# Patient Record
Sex: Female | Born: 1945 | Race: White | Hispanic: No | Marital: Married | Smoking: Former smoker
Health system: Southern US, Community
[De-identification: ages and names within clinical notes are randomized; demographics above are authoritative.]

## PROBLEM LIST (undated history)

## (undated) DIAGNOSIS — E785 Hyperlipidemia, unspecified: Secondary | ICD-10-CM

## (undated) DIAGNOSIS — C4491 Basal cell carcinoma of skin, unspecified: Secondary | ICD-10-CM

## (undated) DIAGNOSIS — Z803 Family history of malignant neoplasm of breast: Secondary | ICD-10-CM

## (undated) DIAGNOSIS — M329 Systemic lupus erythematosus, unspecified: Secondary | ICD-10-CM

## (undated) DIAGNOSIS — M1990 Unspecified osteoarthritis, unspecified site: Secondary | ICD-10-CM

## (undated) DIAGNOSIS — IMO0002 Reserved for concepts with insufficient information to code with codable children: Secondary | ICD-10-CM

## (undated) DIAGNOSIS — F329 Major depressive disorder, single episode, unspecified: Secondary | ICD-10-CM

## (undated) DIAGNOSIS — Z808 Family history of malignant neoplasm of other organs or systems: Secondary | ICD-10-CM

## (undated) DIAGNOSIS — M351 Other overlap syndromes: Secondary | ICD-10-CM

## (undated) DIAGNOSIS — C169 Malignant neoplasm of stomach, unspecified: Secondary | ICD-10-CM

## (undated) DIAGNOSIS — K219 Gastro-esophageal reflux disease without esophagitis: Secondary | ICD-10-CM

## (undated) DIAGNOSIS — Z8489 Family history of other specified conditions: Secondary | ICD-10-CM

## (undated) DIAGNOSIS — R011 Cardiac murmur, unspecified: Secondary | ICD-10-CM

## (undated) DIAGNOSIS — M797 Fibromyalgia: Secondary | ICD-10-CM

## (undated) DIAGNOSIS — F32A Depression, unspecified: Secondary | ICD-10-CM

## (undated) DIAGNOSIS — Z800 Family history of malignant neoplasm of digestive organs: Secondary | ICD-10-CM

## (undated) DIAGNOSIS — Z801 Family history of malignant neoplasm of trachea, bronchus and lung: Secondary | ICD-10-CM

## (undated) HISTORY — PX: BREAST SURGERY: SHX581

## (undated) HISTORY — PX: ABDOMINAL HYSTERECTOMY: SHX81

## (undated) HISTORY — PX: APPENDECTOMY: SHX54

## (undated) HISTORY — DX: Family history of malignant neoplasm of digestive organs: Z80.0

## (undated) HISTORY — DX: Family history of malignant neoplasm of trachea, bronchus and lung: Z80.1

## (undated) HISTORY — PX: TONSILLECTOMY: SUR1361

## (undated) HISTORY — DX: Family history of malignant neoplasm of breast: Z80.3

## (undated) HISTORY — DX: Family history of malignant neoplasm of other organs or systems: Z80.8

## (undated) HISTORY — DX: Malignant neoplasm of stomach, unspecified: C16.9

---

## 1991-07-18 HISTORY — PX: BREAST EXCISIONAL BIOPSY: SUR124

## 1998-05-24 ENCOUNTER — Ambulatory Visit (HOSPITAL_COMMUNITY): Admission: RE | Admit: 1998-05-24 | Discharge: 1998-05-24 | Payer: Self-pay | Admitting: Obstetrics and Gynecology

## 1998-08-15 ENCOUNTER — Other Ambulatory Visit: Admission: RE | Admit: 1998-08-15 | Discharge: 1998-08-15 | Payer: Self-pay | Admitting: Obstetrics & Gynecology

## 1998-11-28 ENCOUNTER — Ambulatory Visit (HOSPITAL_COMMUNITY): Admission: RE | Admit: 1998-11-28 | Discharge: 1998-11-28 | Payer: Self-pay | Admitting: Gastroenterology

## 1999-06-06 ENCOUNTER — Ambulatory Visit (HOSPITAL_COMMUNITY): Admission: RE | Admit: 1999-06-06 | Discharge: 1999-06-06 | Payer: Self-pay | Admitting: Obstetrics and Gynecology

## 2000-06-25 ENCOUNTER — Other Ambulatory Visit: Admission: RE | Admit: 2000-06-25 | Discharge: 2000-06-25 | Payer: Self-pay | Admitting: Obstetrics and Gynecology

## 2000-09-09 ENCOUNTER — Encounter: Payer: Self-pay | Admitting: Obstetrics and Gynecology

## 2000-09-09 ENCOUNTER — Ambulatory Visit (HOSPITAL_COMMUNITY): Admission: RE | Admit: 2000-09-09 | Discharge: 2000-09-09 | Payer: Self-pay | Admitting: Obstetrics and Gynecology

## 2001-07-23 ENCOUNTER — Encounter: Admission: RE | Admit: 2001-07-23 | Discharge: 2001-07-23 | Payer: Self-pay | Admitting: Obstetrics and Gynecology

## 2001-07-23 ENCOUNTER — Encounter: Payer: Self-pay | Admitting: Obstetrics and Gynecology

## 2001-07-30 ENCOUNTER — Other Ambulatory Visit: Admission: RE | Admit: 2001-07-30 | Discharge: 2001-07-30 | Payer: Self-pay | Admitting: Obstetrics and Gynecology

## 2003-07-13 ENCOUNTER — Encounter: Admission: RE | Admit: 2003-07-13 | Discharge: 2003-07-13 | Payer: Self-pay | Admitting: Obstetrics and Gynecology

## 2003-07-13 ENCOUNTER — Encounter: Payer: Self-pay | Admitting: Obstetrics and Gynecology

## 2004-09-22 ENCOUNTER — Encounter: Admission: RE | Admit: 2004-09-22 | Discharge: 2004-09-22 | Payer: Self-pay | Admitting: Family Medicine

## 2005-01-25 ENCOUNTER — Encounter: Admission: RE | Admit: 2005-01-25 | Discharge: 2005-01-25 | Payer: Self-pay | Admitting: Obstetrics and Gynecology

## 2006-05-21 ENCOUNTER — Encounter: Admission: RE | Admit: 2006-05-21 | Discharge: 2006-05-21 | Payer: Self-pay | Admitting: Obstetrics and Gynecology

## 2006-06-24 ENCOUNTER — Encounter: Admission: RE | Admit: 2006-06-24 | Discharge: 2006-06-24 | Payer: Self-pay | Admitting: Obstetrics and Gynecology

## 2007-07-02 ENCOUNTER — Encounter: Admission: RE | Admit: 2007-07-02 | Discharge: 2007-07-02 | Payer: Self-pay | Admitting: Obstetrics and Gynecology

## 2008-07-26 ENCOUNTER — Encounter: Admission: RE | Admit: 2008-07-26 | Discharge: 2008-07-26 | Payer: Self-pay | Admitting: Obstetrics and Gynecology

## 2009-07-25 ENCOUNTER — Encounter: Admission: RE | Admit: 2009-07-25 | Discharge: 2009-07-25 | Payer: Self-pay | Admitting: Family Medicine

## 2009-08-08 ENCOUNTER — Encounter: Admission: RE | Admit: 2009-08-08 | Discharge: 2009-08-08 | Payer: Self-pay | Admitting: Obstetrics and Gynecology

## 2010-10-06 ENCOUNTER — Encounter: Admission: RE | Admit: 2010-10-06 | Discharge: 2010-10-06 | Payer: Self-pay | Admitting: Family Medicine

## 2011-11-13 ENCOUNTER — Other Ambulatory Visit: Payer: Self-pay | Admitting: Family Medicine

## 2011-11-13 DIAGNOSIS — Z1231 Encounter for screening mammogram for malignant neoplasm of breast: Secondary | ICD-10-CM

## 2011-11-19 ENCOUNTER — Ambulatory Visit
Admission: RE | Admit: 2011-11-19 | Discharge: 2011-11-19 | Disposition: A | Discharge status: Home / Self Care | Payer: PRIVATE HEALTH INSURANCE | Source: Ambulatory Visit | Attending: Family Medicine | Admitting: Family Medicine

## 2011-11-19 DIAGNOSIS — Z1231 Encounter for screening mammogram for malignant neoplasm of breast: Secondary | ICD-10-CM

## 2012-12-23 ENCOUNTER — Other Ambulatory Visit: Payer: Self-pay | Admitting: Family Medicine

## 2012-12-23 DIAGNOSIS — Z1231 Encounter for screening mammogram for malignant neoplasm of breast: Secondary | ICD-10-CM

## 2013-01-19 ENCOUNTER — Ambulatory Visit
Admission: RE | Admit: 2013-01-19 | Discharge: 2013-01-19 | Disposition: A | Discharge status: Home / Self Care | Payer: PRIVATE HEALTH INSURANCE | Source: Ambulatory Visit | Attending: Family Medicine | Admitting: Family Medicine

## 2013-01-19 DIAGNOSIS — Z1231 Encounter for screening mammogram for malignant neoplasm of breast: Secondary | ICD-10-CM

## 2013-10-29 DIAGNOSIS — M899 Disorder of bone, unspecified: Secondary | ICD-10-CM | POA: Diagnosis not present

## 2013-10-29 DIAGNOSIS — E559 Vitamin D deficiency, unspecified: Secondary | ICD-10-CM | POA: Diagnosis not present

## 2013-10-29 DIAGNOSIS — F339 Major depressive disorder, recurrent, unspecified: Secondary | ICD-10-CM | POA: Diagnosis not present

## 2013-10-29 DIAGNOSIS — E782 Mixed hyperlipidemia: Secondary | ICD-10-CM | POA: Diagnosis not present

## 2013-10-29 DIAGNOSIS — M2550 Pain in unspecified joint: Secondary | ICD-10-CM | POA: Diagnosis not present

## 2013-10-29 DIAGNOSIS — K219 Gastro-esophageal reflux disease without esophagitis: Secondary | ICD-10-CM | POA: Diagnosis not present

## 2013-10-29 DIAGNOSIS — Z0000 Encounter for general adult medical examination without abnormal findings: Secondary | ICD-10-CM | POA: Diagnosis not present

## 2013-10-29 DIAGNOSIS — Z23 Encounter for immunization: Secondary | ICD-10-CM | POA: Diagnosis not present

## 2013-10-29 DIAGNOSIS — R7301 Impaired fasting glucose: Secondary | ICD-10-CM | POA: Diagnosis not present

## 2013-11-27 DIAGNOSIS — M1710 Unilateral primary osteoarthritis, unspecified knee: Secondary | ICD-10-CM | POA: Diagnosis not present

## 2013-12-29 DIAGNOSIS — M19049 Primary osteoarthritis, unspecified hand: Secondary | ICD-10-CM | POA: Diagnosis not present

## 2013-12-30 DIAGNOSIS — M2040 Other hammer toe(s) (acquired), unspecified foot: Secondary | ICD-10-CM | POA: Diagnosis not present

## 2013-12-30 DIAGNOSIS — M19079 Primary osteoarthritis, unspecified ankle and foot: Secondary | ICD-10-CM | POA: Diagnosis not present

## 2014-01-18 DIAGNOSIS — M1710 Unilateral primary osteoarthritis, unspecified knee: Secondary | ICD-10-CM | POA: Diagnosis not present

## 2014-02-15 DIAGNOSIS — M545 Low back pain, unspecified: Secondary | ICD-10-CM | POA: Diagnosis not present

## 2014-02-15 DIAGNOSIS — M19049 Primary osteoarthritis, unspecified hand: Secondary | ICD-10-CM | POA: Diagnosis not present

## 2014-02-15 DIAGNOSIS — M2550 Pain in unspecified joint: Secondary | ICD-10-CM | POA: Diagnosis not present

## 2014-02-15 DIAGNOSIS — IMO0001 Reserved for inherently not codable concepts without codable children: Secondary | ICD-10-CM | POA: Diagnosis not present

## 2014-02-15 DIAGNOSIS — M159 Polyosteoarthritis, unspecified: Secondary | ICD-10-CM | POA: Diagnosis not present

## 2014-02-23 ENCOUNTER — Encounter (HOSPITAL_COMMUNITY): Payer: Self-pay | Admitting: Emergency Medicine

## 2014-02-23 ENCOUNTER — Emergency Department (HOSPITAL_COMMUNITY): Payer: Medicare Other

## 2014-02-23 ENCOUNTER — Encounter: Payer: Self-pay | Admitting: Internal Medicine

## 2014-02-23 ENCOUNTER — Emergency Department (HOSPITAL_COMMUNITY)
Admission: EM | Admit: 2014-02-23 | Discharge: 2014-02-23 | Disposition: A | Discharge status: Home / Self Care | Payer: Medicare Other | Attending: Emergency Medicine | Admitting: Emergency Medicine

## 2014-02-23 DIAGNOSIS — J40 Bronchitis, not specified as acute or chronic: Secondary | ICD-10-CM | POA: Diagnosis not present

## 2014-02-23 DIAGNOSIS — R110 Nausea: Secondary | ICD-10-CM | POA: Insufficient documentation

## 2014-02-23 DIAGNOSIS — R0789 Other chest pain: Secondary | ICD-10-CM | POA: Diagnosis not present

## 2014-02-23 DIAGNOSIS — Z87891 Personal history of nicotine dependence: Secondary | ICD-10-CM | POA: Diagnosis not present

## 2014-02-23 DIAGNOSIS — E785 Hyperlipidemia, unspecified: Secondary | ICD-10-CM | POA: Diagnosis not present

## 2014-02-23 DIAGNOSIS — Z8739 Personal history of other diseases of the musculoskeletal system and connective tissue: Secondary | ICD-10-CM | POA: Insufficient documentation

## 2014-02-23 DIAGNOSIS — R0602 Shortness of breath: Secondary | ICD-10-CM | POA: Diagnosis not present

## 2014-02-23 DIAGNOSIS — R079 Chest pain, unspecified: Secondary | ICD-10-CM | POA: Diagnosis not present

## 2014-02-23 DIAGNOSIS — R61 Generalized hyperhidrosis: Secondary | ICD-10-CM | POA: Diagnosis not present

## 2014-02-23 HISTORY — DX: Unspecified osteoarthritis, unspecified site: M19.90

## 2014-02-23 HISTORY — DX: Hyperlipidemia, unspecified: E78.5

## 2014-02-23 HISTORY — DX: Reserved for concepts with insufficient information to code with codable children: IMO0002

## 2014-02-23 HISTORY — DX: Systemic lupus erythematosus, unspecified: M32.9

## 2014-02-23 HISTORY — DX: Fibromyalgia: M79.7

## 2014-02-23 LAB — CBC WITH DIFFERENTIAL/PLATELET
Basophils Absolute: 0 10*3/uL (ref 0.0–0.1)
Basophils Relative: 0 % (ref 0–1)
Eosinophils Absolute: 0.1 10*3/uL (ref 0.0–0.7)
Eosinophils Relative: 1 % (ref 0–5)
HCT: 41 % (ref 36.0–46.0)
Hemoglobin: 14.2 g/dL (ref 12.0–15.0)
Lymphocytes Relative: 18 % (ref 12–46)
Lymphs Abs: 2.3 10*3/uL (ref 0.7–4.0)
MCH: 30.3 pg (ref 26.0–34.0)
MCHC: 34.6 g/dL (ref 30.0–36.0)
MCV: 87.4 fL (ref 78.0–100.0)
Monocytes Absolute: 1.2 10*3/uL — ABNORMAL HIGH (ref 0.1–1.0)
Monocytes Relative: 10 % (ref 3–12)
Neutro Abs: 8.9 10*3/uL — ABNORMAL HIGH (ref 1.7–7.7)
Neutrophils Relative %: 71 % (ref 43–77)
Platelets: 225 10*3/uL (ref 150–400)
RBC: 4.69 MIL/uL (ref 3.87–5.11)
RDW: 13 % (ref 11.5–15.5)
WBC: 12.5 10*3/uL — ABNORMAL HIGH (ref 4.0–10.5)

## 2014-02-23 LAB — TROPONIN I
Troponin I: <0.3 ng/mL (ref <0.30)
Troponin I: <0.3 ng/mL (ref <0.30)

## 2014-02-23 LAB — BASIC METABOLIC PANEL
BUN: 22 mg/dL (ref 6–23)
CO2: 25 mEq/L (ref 19–32)
Calcium: 9.4 mg/dL (ref 8.4–10.5)
Chloride: 97 mEq/L (ref 96–112)
Creatinine, Ser: 0.59 mg/dL (ref 0.50–1.10)
GFR calc Af Amer: >90 mL/min (ref >90)
GFR calc non Af Amer: >90 mL/min (ref >90)
Glucose, Bld: 99 mg/dL (ref 70–99)
Potassium: 3.9 mEq/L (ref 3.7–5.3)
Sodium: 136 mEq/L — ABNORMAL LOW (ref 137–147)

## 2014-02-23 NOTE — ED Provider Notes (Signed)
CSN: [id number]     Arrival date & time 02/23/14  1734 History   First MD Initiated Contact with Patient 02/23/14 1744     Chief Complaint  Patient presents with  . Chest Pain    (Consider location/radiation/quality/duration/timing/severity/associated sxs/prior Treatment) HPI Comments: Patient is a 68 year old female with a history of hyperlipidemia who presents to the emergency department for chest pain. Patient states that chest pain was sharp and stabbing in nature and began approximately 1.5 hours ago while at her husband's physician's office. Patient states that pain was present in her lower central chest and radiated to her right jaw and subsequently her left jaw. Patient states that pain improved slightly when lying down and loosening her clothes. Pain lasted approximately 45 minutes before spontaneously resolving. She endorses associated shortness of breath, stating that taking a deep breath but her chest pain worse. She also states she experienced some diaphoresis and nausea. She endorses a history of the same at rest approximately 3 months ago, but states that chest pain was not nearly as severe and did not last as long. Patient denies associated fever, hemoptysis, syncope or near syncope, vomiting, extremity numbness/tingling, extremity weakness, and leg swelling. Patient states she is currently taking a course of prednisone after testing ANA +; she has a longstanding hx of fibromyalgia for which she sees a rheumatologist. Patient denies a history of ACS, CAD, diabetes mellitus, PE/DVT, and coagulopathies. Patient endorses a family history of ACS and her grandmother at the age of 73. She is a former smoker.  Patient is a 68 y.o. female presenting with chest pain. The history is provided by the patient. No language interpreter was used.  Chest Pain Associated symptoms: diaphoresis, nausea and shortness of breath   Associated symptoms: no numbness, not vomiting and no weakness      Past  Medical History  Diagnosis Date  . Lupus   . Osteoarthritis     knee, hand, and feet  . Fibromyalgia   . Hyperlipidemia    Past Surgical History  Procedure Laterality Date  . Abdominal hysterectomy     No family history on file. History  Substance Use Topics  . Smoking status: Former Smoker    Types: Cigarettes    Quit date: 04/18/1991  . Smokeless tobacco: Not on file  . Alcohol Use: Yes     Comment: moderate    OB History   Grav Para Term Preterm Abortions TAB SAB Ect Mult Living                  Review of Systems  Constitutional: Positive for diaphoresis.  HENT:       +b/l jaw pain  Respiratory: Positive for shortness of breath.   Cardiovascular: Positive for chest pain.  Gastrointestinal: Positive for nausea. Negative for vomiting.  Neurological: Negative for syncope, weakness and numbness.  All other systems reviewed and are negative.     Allergies  Review of patient's allergies indicates no known allergies.  Home Medications  No current outpatient prescriptions on file. BP 136/63  Pulse 65  Temp(Src) 98.8 F (37.1 C) (Oral)  Resp 18  SpO2 96%  Physical Exam  Nursing note and vitals reviewed. Constitutional: She is oriented to person, place, and time. She appears well-developed and well-nourished. No distress.  HENT:  Head: Normocephalic and atraumatic.  Mouth/Throat: Oropharynx is clear and moist. No oropharyngeal exudate.  Eyes: Conjunctivae and EOM are normal. Pupils are equal, round, and reactive to light. No scleral icterus.  Neck:  Normal range of motion.  Cardiovascular: Normal rate, regular rhythm and normal heart sounds.   Pulmonary/Chest: Effort normal and breath sounds normal. No respiratory distress. She has no wheezes. She has no rales. She exhibits no tenderness.  Retractions or accessory muscle use. Chest expansion symmetric.  Abdominal: Soft. She exhibits no distension. There is no tenderness. There is no rebound and no guarding.   Musculoskeletal: Normal range of motion.  Neurological: She is alert and oriented to person, place, and time.  Skin: Skin is warm and dry. No rash noted. She is not diaphoretic. No erythema. No pallor.  Psychiatric: She has a normal mood and affect. Her behavior is normal.    ED Course  Procedures (including critical care time) Labs Review Labs Reviewed  CBC WITH DIFFERENTIAL - Abnormal; Notable for the following:    WBC 12.5 (*)    Neutro Abs 8.9 (*)    Monocytes Absolute 1.2 (*)    All other components within normal limits  BASIC METABOLIC PANEL - Abnormal; Notable for the following:    Sodium 136 (*)    All other components within normal limits  TROPONIN I   Imaging Review Dg Chest 2 View  02/23/2014   CLINICAL DATA Mid sternal chest pain radiating posteriorly and laterally, former smoker, history lupus  EXAM CHEST  2 VIEW  COMPARISON 10/28/2012  FINDINGS Normal heart size, mediastinal contours, and pulmonary vascularity.  Central peribronchial thickening.  No acute infiltrate, pleural effusion or pneumothorax.  Bones demineralized.  IMPRESSION Mild bronchitic changes.  SIGNATURE  Electronically Signed   By: Lavonia Dana M.D.   On: 02/23/2014 19:26     EKG Interpretation   Date/Time:  Tuesday February 23 2014 17:56:38 EDT Ventricular Rate:  61 PR Interval:  184 QRS Duration: 95 QT Interval:  390 QTC Calculation: 393 R Axis:   -40 Text Interpretation:  Sinus rhythm Left axis deviation RSR' in V1 or V2,  probably normal variant No old tracing to compare Confirmed by [facility]   MD, Juanda Crumble [phone]) on 02/23/2014 6:18:09 PM      MDM   Final diagnoses:  Chest pain    Patient presents to the ED today for chest pain lasting 30-45 minutes before spontaneously resolving. She has a hx of HLD and is a former smoker. Patient has hx of similar CP ~3 months ago which resolved spontaneously, but was shorter in duration and less intense. Patient well and nontoxic appearing on exam today;  no significant findings. Will work up with CBC, BMP, troponin and CXR. EKG stable without STEMI or ischemic change.  Cardiac work up today is negative. Patient has had no recurrence of pain over ED course. Patient's heart score is 4 c/w moderate risk for MACE; however, patient does not desire admission to the hospital today. Given her improvement in her symptoms spontaneously without intervention, negative serial troponins, normal EKG and normal CXR, I believe the patient is stable for d/c today with instruction to f/u with her PCP for further evaluation of her symptoms and scheduling of an outpatient stress test. Return precautions discussed with the patient and provided at discharge. Patient agreeable to plan today with no unaddressed concerns. Patient seen also by Dr. Karle Starch who is in agreement with this work up, assessment, management plan, and patient's stability for d/c.   Filed Vitals:   02/23/14 1736 02/23/14 1849 02/23/14 2133 02/23/14 2309  BP: 152/77 136/63 126/51 119/68  Pulse: 66 65    Temp: 98.8 F (37.1 C)  TempSrc: Oral     Resp: 16 18 12 11   SpO2: 94% 96% 95% 98%       Antonietta Breach, PA-C 02/24/14 0009

## 2014-02-23 NOTE — Progress Notes (Signed)
Patient ID: Jane Howe, female   DOB: [birth year], 68 y.o.   MRN: [id number]    Location:    [facility]  Place of Service:  Office    Allergies not on file    HPI:  Patient in office with her husband. Developed substernal chest pain and diaphoresis. Radiation of pain into jaw. No prior cardiac history. Mild dyspnea.  Hx fibromyalgia.  Medications: Patient's Medications   No medications on file     Review of Systems  Constitutional: Positive for diaphoresis. Negative for fever.  HENT: Negative.   Eyes: Negative.   Respiratory: Positive for shortness of breath. Negative for cough and choking.   Cardiovascular: Positive for chest pain and palpitations. Negative for leg swelling.  Endocrine: Negative.   Genitourinary: Negative.   Musculoskeletal:       Fibromyalgia  Skin:       diaphoretic  Allergic/Immunologic: Negative.   Neurological: Negative.   Hematological: Negative.   Psychiatric/Behavioral: Negative.     There were no vitals filed for this visit. Physical Exam  Constitutional: She is oriented to person, place, and time. She appears well-developed and well-nourished. She appears distressed.  HENT:  Head: Normocephalic and atraumatic.  Right Ear: External ear normal.  Left Ear: External ear normal.  Nose: Nose normal.  Mouth/Throat: Oropharynx is clear and moist.  Eyes: EOM are normal.  Cardiovascular: Regular rhythm, normal heart sounds and intact distal pulses.  Exam reveals no gallop and no friction rub.   No murmur heard. Pulmonary/Chest: No respiratory distress. She has no wheezes. She has no rales. She exhibits no tenderness.  Abdominal: She exhibits no distension and no mass. There is no tenderness. There is no rebound.  Musculoskeletal: Normal range of motion. She exhibits no edema and no tenderness.  Neurological: She is alert and oriented to person, place, and time. She has normal reflexes. No cranial nerve deficit. Coordination  normal.  Skin: No rash noted. She is diaphoretic. No erythema. No pallor.  Psychiatric: She has a normal mood and affect. Her behavior is normal. Judgment and thought content normal.      Assessment/Plan  Chest pain   Received ASA  Sent to Er for further evaluation

## 2014-02-23 NOTE — ED Notes (Addendum)
Pt arrived by [facility]. Pt went to the doctor with husband who had an appointment and while she was there started having sharp stabbing CP that went through to her back, across her breast, and to her right jaw and then bilateral jaw. Pt became pale and diaphoretic and had some SOB with some nausea. Pt has hx of osteoarthritis and was just dx with Lupus. Doctors office administered ASA 324mg . Pt make decreased without any other tx to 5/10.

## 2014-02-23 NOTE — Discharge Instructions (Signed)
Chest Pain (Nonspecific) °It is often hard to give a specific diagnosis for the cause of chest pain. There is always a chance that your pain could be related to something serious, such as a heart attack or a blood clot in the lungs. You need to follow up with your caregiver for further evaluation. °CAUSES  °· Heartburn. °· Pneumonia or bronchitis. °· Anxiety or stress. °· Inflammation around your heart (pericarditis) or lung (pleuritis or pleurisy). °· A blood clot in the lung. °· A collapsed lung (pneumothorax). It can develop suddenly on its own (spontaneous pneumothorax) or from injury (trauma) to the chest. °· Shingles infection (herpes zoster virus). °The chest wall is composed of bones, muscles, and cartilage. Any of these can be the source of the pain. °· The bones can be bruised by injury. °· The muscles or cartilage can be strained by coughing or overwork. °· The cartilage can be affected by inflammation and become sore (costochondritis). °DIAGNOSIS  °Lab tests or other studies, such as X-rays, electrocardiography, stress testing, or cardiac imaging, may be needed to find the cause of your pain.  °TREATMENT  °· Treatment depends on what may be causing your chest pain. Treatment may include: °· Acid blockers for heartburn. °· Anti-inflammatory medicine. °· Pain medicine for inflammatory conditions. °· Antibiotics if an infection is present. °· You may be advised to change lifestyle habits. This includes stopping smoking and avoiding alcohol, caffeine, and chocolate. °· You may be advised to keep your head raised (elevated) when sleeping. This reduces the chance of acid going backward from your stomach into your esophagus. °· Most of the time, nonspecific chest pain will improve within 2 to 3 days with rest and mild pain medicine. °HOME CARE INSTRUCTIONS  °· If antibiotics were prescribed, take your antibiotics as directed. Finish them even if you start to feel better. °· For the next few days, avoid physical  activities that bring on chest pain. Continue physical activities as directed. °· Do not smoke. °· Avoid drinking alcohol. °· Only take over-the-counter or prescription medicine for pain, discomfort, or fever as directed by your caregiver. °· Follow your caregiver's suggestions for further testing if your chest pain does not go away. °· Keep any follow-up appointments you made. If you do not go to an appointment, you could develop lasting (chronic) problems with pain. If there is any problem keeping an appointment, you must call to reschedule. °SEEK MEDICAL CARE IF:  °· You think you are having problems from the medicine you are taking. Read your medicine instructions carefully. °· Your chest pain does not go away, even after treatment. °· You develop a rash with blisters on your chest. °SEEK IMMEDIATE MEDICAL CARE IF:  °· You have increased chest pain or pain that spreads to your arm, neck, jaw, back, or abdomen. °· You develop shortness of breath, an increasing cough, or you are coughing up blood. °· You have severe back or abdominal pain, feel nauseous, or vomit. °· You develop severe weakness, fainting, or chills. °· You have a fever. °THIS IS AN EMERGENCY. Do not wait to see if the pain will go away. Get medical help at once. Call your local emergency services (911 in [place] Do not drive yourself to the hospital. °MAKE SURE YOU:  °· Understand these instructions. °· Will watch your condition. °· Will get help right away if you are not doing well or get worse. °Document Released: 09/12/2005 Document Revised: 02/25/2012 Document Reviewed: 07/08/2008 °[employer]® Patient Information ©2014 [employer]   [employer]. ° °

## 2014-02-24 NOTE — ED Provider Notes (Signed)
Medical screening examination/treatment/procedure(s) were performed by non-physician practitioner and as supervising physician I was immediately available for consultation/collaboration.   EKG Interpretation   Date/Time:  Tuesday February 23 2014 17:56:38 EDT Ventricular Rate:  61 PR Interval:  184 QRS Duration: 95 QT Interval:  390 QTC Calculation: 393 R Axis:   -40 Text Interpretation:  Sinus rhythm Left axis deviation RSR' in V1 or V2,  probably normal variant No old tracing to compare Confirmed by [facility]   MD, Griffon Herberg [phone]) on 02/23/2014 6:18:09 PM        Mercie Eon. Karle Starch, MD 02/24/14 1019

## 2014-02-26 DIAGNOSIS — R0789 Other chest pain: Secondary | ICD-10-CM | POA: Diagnosis not present

## 2014-03-09 DIAGNOSIS — E669 Obesity, unspecified: Secondary | ICD-10-CM | POA: Diagnosis not present

## 2014-03-09 DIAGNOSIS — R0789 Other chest pain: Secondary | ICD-10-CM | POA: Diagnosis not present

## 2014-03-09 DIAGNOSIS — E785 Hyperlipidemia, unspecified: Secondary | ICD-10-CM | POA: Diagnosis not present

## 2014-03-09 DIAGNOSIS — K219 Gastro-esophageal reflux disease without esophagitis: Secondary | ICD-10-CM | POA: Diagnosis not present

## 2014-03-10 ENCOUNTER — Encounter: Payer: Self-pay | Admitting: Internal Medicine

## 2014-03-10 DIAGNOSIS — M949 Disorder of cartilage, unspecified: Secondary | ICD-10-CM | POA: Diagnosis not present

## 2014-03-10 DIAGNOSIS — M899 Disorder of bone, unspecified: Secondary | ICD-10-CM | POA: Diagnosis not present

## 2014-03-29 DIAGNOSIS — M2040 Other hammer toe(s) (acquired), unspecified foot: Secondary | ICD-10-CM | POA: Diagnosis not present

## 2014-03-29 DIAGNOSIS — M129 Arthropathy, unspecified: Secondary | ICD-10-CM | POA: Diagnosis not present

## 2014-05-20 ENCOUNTER — Other Ambulatory Visit: Payer: Self-pay

## 2014-05-20 DIAGNOSIS — Z1231 Encounter for screening mammogram for malignant neoplasm of breast: Secondary | ICD-10-CM

## 2014-06-01 DIAGNOSIS — J209 Acute bronchitis, unspecified: Secondary | ICD-10-CM | POA: Diagnosis not present

## 2014-06-01 DIAGNOSIS — K5900 Constipation, unspecified: Secondary | ICD-10-CM | POA: Diagnosis not present

## 2014-06-01 DIAGNOSIS — K648 Other hemorrhoids: Secondary | ICD-10-CM | POA: Diagnosis not present

## 2014-06-03 ENCOUNTER — Ambulatory Visit
Admission: RE | Admit: 2014-06-03 | Discharge: 2014-06-03 | Disposition: A | Discharge status: Home / Self Care | Payer: Medicare Other | Source: Ambulatory Visit

## 2014-06-03 DIAGNOSIS — Z1231 Encounter for screening mammogram for malignant neoplasm of breast: Secondary | ICD-10-CM | POA: Diagnosis not present

## 2014-06-23 DIAGNOSIS — H2589 Other age-related cataract: Secondary | ICD-10-CM | POA: Diagnosis not present

## 2014-07-06 DIAGNOSIS — M1710 Unilateral primary osteoarthritis, unspecified knee: Secondary | ICD-10-CM | POA: Diagnosis not present

## 2014-07-16 DIAGNOSIS — M129 Arthropathy, unspecified: Secondary | ICD-10-CM | POA: Diagnosis not present

## 2014-08-18 DIAGNOSIS — IMO0001 Reserved for inherently not codable concepts without codable children: Secondary | ICD-10-CM | POA: Diagnosis not present

## 2014-08-18 DIAGNOSIS — M2550 Pain in unspecified joint: Secondary | ICD-10-CM | POA: Diagnosis not present

## 2014-08-18 DIAGNOSIS — M545 Low back pain, unspecified: Secondary | ICD-10-CM | POA: Diagnosis not present

## 2014-08-18 DIAGNOSIS — M159 Polyosteoarthritis, unspecified: Secondary | ICD-10-CM | POA: Diagnosis not present

## 2014-08-26 DIAGNOSIS — M329 Systemic lupus erythematosus, unspecified: Secondary | ICD-10-CM | POA: Diagnosis not present

## 2014-11-04 DIAGNOSIS — E559 Vitamin D deficiency, unspecified: Secondary | ICD-10-CM | POA: Diagnosis not present

## 2014-11-04 DIAGNOSIS — M797 Fibromyalgia: Secondary | ICD-10-CM | POA: Diagnosis not present

## 2014-11-04 DIAGNOSIS — E782 Mixed hyperlipidemia: Secondary | ICD-10-CM | POA: Diagnosis not present

## 2014-11-04 DIAGNOSIS — Z79899 Other long term (current) drug therapy: Secondary | ICD-10-CM | POA: Diagnosis not present

## 2014-11-04 DIAGNOSIS — G250 Essential tremor: Secondary | ICD-10-CM | POA: Diagnosis not present

## 2014-11-04 DIAGNOSIS — R7301 Impaired fasting glucose: Secondary | ICD-10-CM | POA: Diagnosis not present

## 2014-11-04 DIAGNOSIS — M2550 Pain in unspecified joint: Secondary | ICD-10-CM | POA: Diagnosis not present

## 2014-11-04 DIAGNOSIS — F329 Major depressive disorder, single episode, unspecified: Secondary | ICD-10-CM | POA: Diagnosis not present

## 2015-02-14 DIAGNOSIS — J329 Chronic sinusitis, unspecified: Secondary | ICD-10-CM | POA: Diagnosis not present

## 2015-02-14 DIAGNOSIS — R05 Cough: Secondary | ICD-10-CM | POA: Diagnosis not present

## 2015-02-14 DIAGNOSIS — H109 Unspecified conjunctivitis: Secondary | ICD-10-CM | POA: Diagnosis not present

## 2015-02-16 DIAGNOSIS — M797 Fibromyalgia: Secondary | ICD-10-CM | POA: Diagnosis not present

## 2015-02-16 DIAGNOSIS — M2550 Pain in unspecified joint: Secondary | ICD-10-CM | POA: Diagnosis not present

## 2015-02-16 DIAGNOSIS — M545 Low back pain: Secondary | ICD-10-CM | POA: Diagnosis not present

## 2015-02-16 DIAGNOSIS — M150 Primary generalized (osteo)arthritis: Secondary | ICD-10-CM | POA: Diagnosis not present

## 2015-02-23 DIAGNOSIS — K219 Gastro-esophageal reflux disease without esophagitis: Secondary | ICD-10-CM | POA: Diagnosis not present

## 2015-02-23 DIAGNOSIS — H109 Unspecified conjunctivitis: Secondary | ICD-10-CM | POA: Diagnosis not present

## 2015-02-23 DIAGNOSIS — R251 Tremor, unspecified: Secondary | ICD-10-CM | POA: Diagnosis not present

## 2015-02-23 DIAGNOSIS — Z0001 Encounter for general adult medical examination with abnormal findings: Secondary | ICD-10-CM | POA: Diagnosis not present

## 2015-02-23 DIAGNOSIS — Z23 Encounter for immunization: Secondary | ICD-10-CM | POA: Diagnosis not present

## 2015-02-23 DIAGNOSIS — M899 Disorder of bone, unspecified: Secondary | ICD-10-CM | POA: Diagnosis not present

## 2015-02-23 DIAGNOSIS — Z8371 Family history of colonic polyps: Secondary | ICD-10-CM | POA: Diagnosis not present

## 2015-02-23 DIAGNOSIS — M797 Fibromyalgia: Secondary | ICD-10-CM | POA: Diagnosis not present

## 2015-02-23 DIAGNOSIS — R350 Frequency of micturition: Secondary | ICD-10-CM | POA: Diagnosis not present

## 2015-02-23 DIAGNOSIS — F3342 Major depressive disorder, recurrent, in full remission: Secondary | ICD-10-CM | POA: Diagnosis not present

## 2015-02-23 DIAGNOSIS — E782 Mixed hyperlipidemia: Secondary | ICD-10-CM | POA: Diagnosis not present

## 2015-02-23 DIAGNOSIS — N952 Postmenopausal atrophic vaginitis: Secondary | ICD-10-CM | POA: Diagnosis not present

## 2015-05-11 DIAGNOSIS — M1711 Unilateral primary osteoarthritis, right knee: Secondary | ICD-10-CM | POA: Diagnosis not present

## 2015-05-19 DIAGNOSIS — M19071 Primary osteoarthritis, right ankle and foot: Secondary | ICD-10-CM | POA: Diagnosis not present

## 2015-05-19 DIAGNOSIS — M19072 Primary osteoarthritis, left ankle and foot: Secondary | ICD-10-CM | POA: Diagnosis not present

## 2015-06-13 ENCOUNTER — Other Ambulatory Visit: Payer: Self-pay

## 2015-06-13 DIAGNOSIS — Z1231 Encounter for screening mammogram for malignant neoplasm of breast: Secondary | ICD-10-CM

## 2015-06-15 DIAGNOSIS — M1711 Unilateral primary osteoarthritis, right knee: Secondary | ICD-10-CM | POA: Diagnosis not present

## 2015-06-24 DIAGNOSIS — M1711 Unilateral primary osteoarthritis, right knee: Secondary | ICD-10-CM | POA: Diagnosis not present

## 2015-07-01 DIAGNOSIS — H5213 Myopia, bilateral: Secondary | ICD-10-CM | POA: Diagnosis not present

## 2015-07-01 DIAGNOSIS — H52223 Regular astigmatism, bilateral: Secondary | ICD-10-CM | POA: Diagnosis not present

## 2015-07-01 DIAGNOSIS — H524 Presbyopia: Secondary | ICD-10-CM | POA: Diagnosis not present

## 2015-07-01 DIAGNOSIS — H25813 Combined forms of age-related cataract, bilateral: Secondary | ICD-10-CM | POA: Diagnosis not present

## 2015-07-01 DIAGNOSIS — H43819 Vitreous degeneration, unspecified eye: Secondary | ICD-10-CM | POA: Diagnosis not present

## 2015-07-18 ENCOUNTER — Ambulatory Visit
Admission: RE | Admit: 2015-07-18 | Discharge: 2015-07-18 | Disposition: A | Discharge status: Home / Self Care | Payer: Medicare Other | Source: Ambulatory Visit

## 2015-07-18 DIAGNOSIS — Z1231 Encounter for screening mammogram for malignant neoplasm of breast: Secondary | ICD-10-CM | POA: Diagnosis not present

## 2015-08-16 DIAGNOSIS — D2371 Other benign neoplasm of skin of right lower limb, including hip: Secondary | ICD-10-CM | POA: Diagnosis not present

## 2015-08-16 DIAGNOSIS — Z85828 Personal history of other malignant neoplasm of skin: Secondary | ICD-10-CM | POA: Diagnosis not present

## 2015-08-16 DIAGNOSIS — L814 Other melanin hyperpigmentation: Secondary | ICD-10-CM | POA: Diagnosis not present

## 2015-08-16 DIAGNOSIS — L738 Other specified follicular disorders: Secondary | ICD-10-CM | POA: Diagnosis not present

## 2015-08-16 DIAGNOSIS — L570 Actinic keratosis: Secondary | ICD-10-CM | POA: Diagnosis not present

## 2015-08-16 DIAGNOSIS — L918 Other hypertrophic disorders of the skin: Secondary | ICD-10-CM | POA: Diagnosis not present

## 2015-08-16 DIAGNOSIS — L280 Lichen simplex chronicus: Secondary | ICD-10-CM | POA: Diagnosis not present

## 2015-08-16 DIAGNOSIS — L821 Other seborrheic keratosis: Secondary | ICD-10-CM | POA: Diagnosis not present

## 2015-08-16 DIAGNOSIS — D2272 Melanocytic nevi of left lower limb, including hip: Secondary | ICD-10-CM | POA: Diagnosis not present

## 2015-08-17 DIAGNOSIS — M359 Systemic involvement of connective tissue, unspecified: Secondary | ICD-10-CM | POA: Diagnosis not present

## 2015-08-17 DIAGNOSIS — M150 Primary generalized (osteo)arthritis: Secondary | ICD-10-CM | POA: Diagnosis not present

## 2015-08-17 DIAGNOSIS — M549 Dorsalgia, unspecified: Secondary | ICD-10-CM | POA: Diagnosis not present

## 2015-08-17 DIAGNOSIS — M797 Fibromyalgia: Secondary | ICD-10-CM | POA: Diagnosis not present

## 2015-09-26 DIAGNOSIS — M797 Fibromyalgia: Secondary | ICD-10-CM | POA: Diagnosis not present

## 2015-09-26 DIAGNOSIS — R350 Frequency of micturition: Secondary | ICD-10-CM | POA: Diagnosis not present

## 2015-09-26 DIAGNOSIS — Z8371 Family history of colonic polyps: Secondary | ICD-10-CM | POA: Diagnosis not present

## 2015-09-26 DIAGNOSIS — Z791 Long term (current) use of non-steroidal anti-inflammatories (NSAID): Secondary | ICD-10-CM | POA: Diagnosis not present

## 2015-09-26 DIAGNOSIS — N952 Postmenopausal atrophic vaginitis: Secondary | ICD-10-CM | POA: Diagnosis not present

## 2015-09-26 DIAGNOSIS — R251 Tremor, unspecified: Secondary | ICD-10-CM | POA: Diagnosis not present

## 2015-09-26 DIAGNOSIS — H109 Unspecified conjunctivitis: Secondary | ICD-10-CM | POA: Diagnosis not present

## 2015-09-26 DIAGNOSIS — Z0001 Encounter for general adult medical examination with abnormal findings: Secondary | ICD-10-CM | POA: Diagnosis not present

## 2015-09-26 DIAGNOSIS — K219 Gastro-esophageal reflux disease without esophagitis: Secondary | ICD-10-CM | POA: Diagnosis not present

## 2015-09-26 DIAGNOSIS — F3342 Major depressive disorder, recurrent, in full remission: Secondary | ICD-10-CM | POA: Diagnosis not present

## 2015-09-26 DIAGNOSIS — E782 Mixed hyperlipidemia: Secondary | ICD-10-CM | POA: Diagnosis not present

## 2015-09-26 DIAGNOSIS — M899 Disorder of bone, unspecified: Secondary | ICD-10-CM | POA: Diagnosis not present

## 2015-10-09 DIAGNOSIS — H00013 Hordeolum externum right eye, unspecified eyelid: Secondary | ICD-10-CM | POA: Diagnosis not present

## 2015-12-07 DIAGNOSIS — M797 Fibromyalgia: Secondary | ICD-10-CM | POA: Diagnosis not present

## 2015-12-07 DIAGNOSIS — M359 Systemic involvement of connective tissue, unspecified: Secondary | ICD-10-CM | POA: Diagnosis not present

## 2015-12-07 DIAGNOSIS — M549 Dorsalgia, unspecified: Secondary | ICD-10-CM | POA: Diagnosis not present

## 2015-12-07 DIAGNOSIS — M150 Primary generalized (osteo)arthritis: Secondary | ICD-10-CM | POA: Diagnosis not present

## 2016-02-09 ENCOUNTER — Other Ambulatory Visit: Payer: Self-pay | Admitting: Family Medicine

## 2016-02-09 DIAGNOSIS — R1011 Right upper quadrant pain: Secondary | ICD-10-CM | POA: Diagnosis not present

## 2016-02-09 DIAGNOSIS — R1013 Epigastric pain: Secondary | ICD-10-CM | POA: Diagnosis not present

## 2016-02-09 DIAGNOSIS — K5909 Other constipation: Secondary | ICD-10-CM | POA: Diagnosis not present

## 2016-02-20 ENCOUNTER — Ambulatory Visit
Admission: RE | Admit: 2016-02-20 | Discharge: 2016-02-20 | Disposition: A | Discharge status: Home / Self Care | Payer: Medicare Other | Source: Ambulatory Visit | Attending: Family Medicine | Admitting: Family Medicine

## 2016-02-20 DIAGNOSIS — R110 Nausea: Secondary | ICD-10-CM | POA: Diagnosis not present

## 2016-02-20 DIAGNOSIS — R1013 Epigastric pain: Secondary | ICD-10-CM

## 2016-02-28 DIAGNOSIS — Z0000 Encounter for general adult medical examination without abnormal findings: Secondary | ICD-10-CM | POA: Diagnosis not present

## 2016-02-28 DIAGNOSIS — R110 Nausea: Secondary | ICD-10-CM | POA: Diagnosis not present

## 2016-02-28 DIAGNOSIS — E782 Mixed hyperlipidemia: Secondary | ICD-10-CM | POA: Diagnosis not present

## 2016-02-28 DIAGNOSIS — M351 Other overlap syndromes: Secondary | ICD-10-CM | POA: Diagnosis not present

## 2016-02-28 DIAGNOSIS — Z124 Encounter for screening for malignant neoplasm of cervix: Secondary | ICD-10-CM | POA: Diagnosis not present

## 2016-02-28 DIAGNOSIS — M8589 Other specified disorders of bone density and structure, multiple sites: Secondary | ICD-10-CM | POA: Diagnosis not present

## 2016-02-28 DIAGNOSIS — F3342 Major depressive disorder, recurrent, in full remission: Secondary | ICD-10-CM | POA: Diagnosis not present

## 2016-02-28 DIAGNOSIS — E559 Vitamin D deficiency, unspecified: Secondary | ICD-10-CM | POA: Diagnosis not present

## 2016-02-28 DIAGNOSIS — M2550 Pain in unspecified joint: Secondary | ICD-10-CM | POA: Diagnosis not present

## 2016-02-28 DIAGNOSIS — R7301 Impaired fasting glucose: Secondary | ICD-10-CM | POA: Diagnosis not present

## 2016-02-28 DIAGNOSIS — K219 Gastro-esophageal reflux disease without esophagitis: Secondary | ICD-10-CM | POA: Diagnosis not present

## 2016-03-06 DIAGNOSIS — M25571 Pain in right ankle and joints of right foot: Secondary | ICD-10-CM | POA: Diagnosis not present

## 2016-03-06 DIAGNOSIS — M797 Fibromyalgia: Secondary | ICD-10-CM | POA: Diagnosis not present

## 2016-03-06 DIAGNOSIS — M359 Systemic involvement of connective tissue, unspecified: Secondary | ICD-10-CM | POA: Diagnosis not present

## 2016-03-06 DIAGNOSIS — M150 Primary generalized (osteo)arthritis: Secondary | ICD-10-CM | POA: Diagnosis not present

## 2016-03-06 DIAGNOSIS — M549 Dorsalgia, unspecified: Secondary | ICD-10-CM | POA: Diagnosis not present

## 2016-04-05 DIAGNOSIS — M8589 Other specified disorders of bone density and structure, multiple sites: Secondary | ICD-10-CM | POA: Diagnosis not present

## 2016-04-05 DIAGNOSIS — M1711 Unilateral primary osteoarthritis, right knee: Secondary | ICD-10-CM | POA: Diagnosis not present

## 2016-04-05 DIAGNOSIS — M859 Disorder of bone density and structure, unspecified: Secondary | ICD-10-CM | POA: Diagnosis not present

## 2016-04-16 DIAGNOSIS — M2041 Other hammer toe(s) (acquired), right foot: Secondary | ICD-10-CM | POA: Diagnosis not present

## 2016-04-16 DIAGNOSIS — M19071 Primary osteoarthritis, right ankle and foot: Secondary | ICD-10-CM | POA: Diagnosis not present

## 2016-04-16 DIAGNOSIS — M2042 Other hammer toe(s) (acquired), left foot: Secondary | ICD-10-CM | POA: Diagnosis not present

## 2016-05-03 DIAGNOSIS — M19072 Primary osteoarthritis, left ankle and foot: Secondary | ICD-10-CM | POA: Diagnosis not present

## 2016-05-03 DIAGNOSIS — M19071 Primary osteoarthritis, right ankle and foot: Secondary | ICD-10-CM | POA: Diagnosis not present

## 2016-05-10 DIAGNOSIS — H524 Presbyopia: Secondary | ICD-10-CM | POA: Diagnosis not present

## 2016-05-10 DIAGNOSIS — H5213 Myopia, bilateral: Secondary | ICD-10-CM | POA: Diagnosis not present

## 2016-05-10 DIAGNOSIS — H25813 Combined forms of age-related cataract, bilateral: Secondary | ICD-10-CM | POA: Diagnosis not present

## 2016-05-10 DIAGNOSIS — H52223 Regular astigmatism, bilateral: Secondary | ICD-10-CM | POA: Diagnosis not present

## 2016-05-27 ENCOUNTER — Ambulatory Visit: Payer: Self-pay | Admitting: Orthopedic Surgery

## 2016-06-06 DIAGNOSIS — M359 Systemic involvement of connective tissue, unspecified: Secondary | ICD-10-CM | POA: Diagnosis not present

## 2016-06-06 DIAGNOSIS — M797 Fibromyalgia: Secondary | ICD-10-CM | POA: Diagnosis not present

## 2016-06-06 DIAGNOSIS — M549 Dorsalgia, unspecified: Secondary | ICD-10-CM | POA: Diagnosis not present

## 2016-06-06 DIAGNOSIS — M150 Primary generalized (osteo)arthritis: Secondary | ICD-10-CM | POA: Diagnosis not present

## 2016-07-09 ENCOUNTER — Encounter (HOSPITAL_COMMUNITY): Payer: Self-pay

## 2016-07-09 ENCOUNTER — Encounter (HOSPITAL_COMMUNITY)
Admission: RE | Admit: 2016-07-09 | Discharge: 2016-07-09 | Disposition: A | Discharge status: Home / Self Care | Payer: Medicare Other | Source: Ambulatory Visit | Attending: Orthopedic Surgery | Admitting: Orthopedic Surgery

## 2016-07-09 DIAGNOSIS — Z01818 Encounter for other preprocedural examination: Secondary | ICD-10-CM | POA: Insufficient documentation

## 2016-07-09 HISTORY — DX: Gastro-esophageal reflux disease without esophagitis: K21.9

## 2016-07-09 HISTORY — DX: Depression, unspecified: F32.A

## 2016-07-09 HISTORY — DX: Major depressive disorder, single episode, unspecified: F32.9

## 2016-07-09 HISTORY — DX: Basal cell carcinoma of skin, unspecified: C44.91

## 2016-07-09 HISTORY — DX: Other overlap syndromes: M35.1

## 2016-07-09 LAB — COMPREHENSIVE METABOLIC PANEL
ALT: 17 U/L (ref 14–54)
AST: 20 U/L (ref 15–41)
Albumin: 4.2 g/dL (ref 3.5–5.0)
Alkaline Phosphatase: 50 U/L (ref 38–126)
Anion gap: 5 (ref 5–15)
BUN: 13 mg/dL (ref 6–20)
CO2: 28 mmol/L (ref 22–32)
Calcium: 8.7 mg/dL — ABNORMAL LOW (ref 8.9–10.3)
Chloride: 103 mmol/L (ref 101–111)
Creatinine, Ser: 0.68 mg/dL (ref 0.44–1.00)
GFR calc Af Amer: >60 mL/min (ref >60)
GFR calc non Af Amer: >60 mL/min (ref >60)
Glucose, Bld: 98 mg/dL (ref 65–99)
Potassium: 4.7 mmol/L (ref 3.5–5.1)
Sodium: 136 mmol/L (ref 135–145)
Total Bilirubin: 0.6 mg/dL (ref 0.3–1.2)
Total Protein: 6.8 g/dL (ref 6.5–8.1)

## 2016-07-09 LAB — CBC
HCT: 41.7 % (ref 36.0–46.0)
Hemoglobin: 13.9 g/dL (ref 12.0–15.0)
MCH: 29.5 pg (ref 26.0–34.0)
MCHC: 33.3 g/dL (ref 30.0–36.0)
MCV: 88.5 fL (ref 78.0–100.0)
Platelets: 220 10*3/uL (ref 150–400)
RBC: 4.71 MIL/uL (ref 3.87–5.11)
RDW: 12.6 % (ref 11.5–15.5)
WBC: 7.8 10*3/uL (ref 4.0–10.5)

## 2016-07-09 LAB — URINALYSIS, ROUTINE W REFLEX MICROSCOPIC
Bilirubin Urine: NEGATIVE
Glucose, UA: NEGATIVE mg/dL
Hgb urine dipstick: NEGATIVE
Ketones, ur: NEGATIVE mg/dL
Leukocytes, UA: NEGATIVE
Nitrite: NEGATIVE
Protein, ur: NEGATIVE mg/dL
Specific Gravity, Urine: 1.008 (ref 1.005–1.030)
pH: 6 (ref 5.0–8.0)

## 2016-07-09 LAB — SURGICAL PCR SCREEN
MRSA, PCR: NEGATIVE
Staphylococcus aureus: NEGATIVE

## 2016-07-09 LAB — APTT: aPTT: 31 seconds (ref 24–37)

## 2016-07-09 LAB — PROTIME-INR
INR: 0.98 (ref 0.00–1.49)
Prothrombin Time: 13.2 seconds (ref 11.6–15.2)

## 2016-07-09 LAB — ABO/RH: ABO/RH(D): B POS

## 2016-07-09 NOTE — Patient Instructions (Signed)
Jane Howe  07/09/2016   Your procedure is scheduled on: 07/16/16  Report to [facility] Main  Entrance take [place]  elevators to 3rd floor to  [place] at  [place] AM.  Call this number if you have problems the morning of surgery [phone]   Remember: ONLY 1 PERSON MAY GO WITH YOU TO SHORT STAY TO GET  READY MORNING OF YOUR SURGERY.  Do not eat food :After Midnight. MAY HAVE CLEAR LIQUIDS UNTIL 0630am-  THEN NOTHING BY MOUTH            DO NOT USE MILK OR DAIRY PRODUCTS OR FRUIT JUICES WITH A PULP CONSISTENCY     Take these medicines the morning of surgery with A SIP OF WATER: Propranolol, Cymbalta,Prolisec May take Tramadol if needed DO NOT TAKE ANY DIABETIC MEDICATIONS DAY OF YOUR SURGERY                               You may not have any metal on your body including hair pins and              piercings  Do not wear jewelry, make-up, lotions, powders or perfumes, deodorant             Do not wear nail polish.  Do not shave  48 hours prior to surgery.              Men may shave face and neck.   Do not bring valuables to the hospital. [place].  Contacts, dentures or bridgework may not be worn into surgery.  Leave suitcase in the car. After surgery it may be brought to your room.              [place] - Preparing for Surgery Before surgery, you can play an important role.  Because skin is not sterile, your skin needs to be as free of germs as possible.  You can reduce the number of germs on your skin by washing with CHG (chlorahexidine gluconate) soap before surgery.  CHG is an antiseptic cleaner which kills germs and bonds with the skin to continue killing germs even after washing. Please DO NOT use if you have an allergy to CHG or antibacterial soaps.  If your skin becomes reddened/irritated stop using the CHG and inform your nurse when you arrive at Short Stay. Do not shave (including legs and  underarms) for at least 48 hours prior to the first CHG shower.  You may shave your face/neck. Please follow these instructions carefully:  1.  Shower with CHG Soap the night before surgery and the  morning of Surgery.  2.  If you choose to wash your hair, wash your hair first as usual with your  normal  shampoo.  3.  After you shampoo, rinse your hair and body thoroughly to remove the  shampoo.                           4.  Use CHG as you would any other liquid soap.  You can apply chg directly  to the skin and wash  Gently with a scrungie or clean washcloth.  5.  Apply the CHG Soap to your body ONLY FROM THE NECK DOWN.   Do not use on face/ open                           Wound or open sores. Avoid contact with eyes, ears mouth and genitals (private parts).                       Wash face,  Genitals (private parts) with your normal soap.             6.  Wash thoroughly, paying special attention to the area where your surgery  will be performed.  7.  Thoroughly rinse your body with warm water from the neck down.  8.  DO NOT shower/wash with your normal soap after using and rinsing off  the CHG Soap.                9.  Pat yourself dry with a clean towel.            10.  Wear clean pajamas.            11.  Place clean sheets on your bed the night of your first shower and do not  sleep with pets. Day of Surgery : Do not apply any lotions/deodorants the morning of surgery.  Please wear clean clothes to the hospital/surgery center.  FAILURE TO FOLLOW THESE INSTRUCTIONS MAY RESULT IN THE CANCELLATION OF YOUR SURGERY PATIENT SIGNATURE_________________________________  NURSE SIGNATURE__________________________________  ________________________________________________________________________   Adam Phenix  An incentive spirometer is a tool that can help keep your lungs clear and active. This tool measures how well you are filling your lungs with each breath. Taking  long deep breaths may help reverse or decrease the chance of developing breathing (pulmonary) problems (especially infection) following:  A long period of time when you are unable to move or be active. BEFORE THE PROCEDURE   If the spirometer includes an indicator to show your best effort, your nurse or respiratory therapist will set it to a desired goal.  If possible, sit up straight or lean slightly forward. Try not to slouch.  Hold the incentive spirometer in an upright position. INSTRUCTIONS FOR USE  1. Sit on the edge of your bed if possible, or sit up as far as you can in bed or on a chair. 2. Hold the incentive spirometer in an upright position. 3. Breathe out normally. 4. Place the mouthpiece in your mouth and seal your lips tightly around it. 5. Breathe in slowly and as deeply as possible, raising the piston or the ball toward the top of the column. 6. Hold your breath for 3-5 seconds or for as long as possible. Allow the piston or ball to fall to the bottom of the column. 7. Remove the mouthpiece from your mouth and breathe out normally. 8. Rest for a few seconds and repeat Steps 1 through 7 at least 10 times every 1-2 hours when you are awake. Take your time and take a few normal breaths between deep breaths. 9. The spirometer may include an indicator to show your best effort. Use the indicator as a goal to work toward during each repetition. 10. After each set of 10 deep breaths, practice coughing to be sure your lungs are clear. If you have an incision (the cut made at the time of surgery),  support your incision when coughing by placing a pillow or rolled up towels firmly against it. Once you are able to get out of bed, walk around indoors and cough well. You may stop using the incentive spirometer when instructed by your caregiver.  RISKS AND COMPLICATIONS  Take your time so you do not get dizzy or light-headed.  If you are in pain, you may need to take or ask for pain  medication before doing incentive spirometry. It is harder to take a deep breath if you are having pain. AFTER USE  Rest and breathe slowly and easily.  It can be helpful to keep track of a log of your progress. Your caregiver can provide you with a simple table to help with this. If you are using the spirometer at home, follow these instructions: [place] IF:   You are having difficultly using the spirometer.  You have trouble using the spirometer as often as instructed.  Your pain medication is not giving enough relief while using the spirometer.  You develop fever of 100.5 F (38.1 C) or higher. SEEK IMMEDIATE MEDICAL CARE IF:   You cough up bloody sputum that had not been present before.  You develop fever of 102 F (38.9 C) or greater.  You develop worsening pain at or near the incision site. MAKE SURE YOU:   Understand these instructions.  Will watch your condition.  Will get help right away if you are not doing well or get worse. Document Released: 04/15/2007 Document Revised: 02/25/2012 Document Reviewed: 06/16/2007 ExitCare Patient Information 2014 Exit[employer], [place].   ________________________________________________________________________  WHAT IS A BLOOD TRANSFUSION? Blood Transfusion Information  A transfusion is the replacement of blood or some of its parts. Blood is made up of multiple cells which provide different functions.  Red blood cells carry oxygen and are used for blood loss replacement.  White blood cells fight against infection.  Platelets control bleeding.  Plasma helps clot blood.  Other blood products are available for specialized needs, such as hemophilia or other clotting disorders. BEFORE THE TRANSFUSION  Who gives blood for transfusions?   Healthy volunteers who are fully evaluated to make sure their blood is safe. This is blood bank blood. Transfusion therapy is the safest it has ever been in the practice of medicine.  Before blood is taken from a donor, a complete history is taken to make sure that person has no history of diseases nor engages in risky social behavior (examples are intravenous drug use or sexual activity with multiple partners). The donor's travel history is screened to minimize risk of transmitting infections, such as malaria. The donated blood is tested for signs of infectious diseases, such as HIV and hepatitis. The blood is then tested to be sure it is compatible with you in order to minimize the chance of a transfusion reaction. If you or a relative donates blood, this is often done in anticipation of surgery and is not appropriate for emergency situations. It takes many days to process the donated blood. RISKS AND COMPLICATIONS Although transfusion therapy is very safe and saves many lives, the main dangers of transfusion include:   Getting an infectious disease.  Developing a transfusion reaction. This is an allergic reaction to something in the blood you were given. Every precaution is taken to prevent this. The decision to have a blood transfusion has been considered carefully by your caregiver before blood is given. Blood is not given unless the benefits outweigh the risks. AFTER THE TRANSFUSION  Right after receiving a blood transfusion, you will usually feel much better and more energetic. This is especially true if your red blood cells have gotten low (anemic). The transfusion raises the level of the red blood cells which carry oxygen, and this usually causes an energy increase.  The nurse administering the transfusion will monitor you carefully for complications. HOME CARE INSTRUCTIONS  No special instructions are needed after a transfusion. You may find your energy is better. Speak with your caregiver about any limitations on activity for underlying diseases you may have. SEEK MEDICAL CARE IF:   Your condition is not improving after your transfusion.  You develop redness or  irritation at the intravenous (IV) site. SEEK IMMEDIATE MEDICAL CARE IF:  Any of the following symptoms occur over the next 12 hours:  Shaking chills.  You have a temperature by mouth above 102 F (38.9 C), not controlled by medicine.  Chest, back, or muscle pain.  People around you feel you are not acting correctly or are confused.  Shortness of breath or difficulty breathing.  Dizziness and fainting.  You get a rash or develop hives.  You have a decrease in urine output.  Your urine turns a dark color or changes to pink, red, or brown. Any of the following symptoms occur over the next 10 days:  You have a temperature by mouth above 102 F (38.9 C), not controlled by medicine.  Shortness of breath.  Weakness after normal activity.  The white part of the eye turns yellow (jaundice).  You have a decrease in the amount of urine or are urinating less often.  Your urine turns a dark color or changes to pink, red, or brown. Document Released: 11/30/2000 Document Revised: 02/25/2012 Document Reviewed: 07/19/2008 [facility] Patient Information 2014 [place], [place].  _______________________________________________________________________

## 2016-07-09 NOTE — Progress Notes (Signed)
States takes Inderal for tremors- not for her heart- takes about 4-5-x week

## 2016-07-09 NOTE — Progress Notes (Signed)
lov dr w Addison Lank 3/17 on chart.  Clearance not received as of yet- D Perkins notified

## 2016-07-10 NOTE — Progress Notes (Signed)
lov dr syed on chart 6/17

## 2016-07-11 DIAGNOSIS — E782 Mixed hyperlipidemia: Secondary | ICD-10-CM | POA: Diagnosis not present

## 2016-07-11 DIAGNOSIS — M1711 Unilateral primary osteoarthritis, right knee: Secondary | ICD-10-CM | POA: Diagnosis not present

## 2016-07-11 DIAGNOSIS — Z01818 Encounter for other preprocedural examination: Secondary | ICD-10-CM | POA: Diagnosis not present

## 2016-07-11 DIAGNOSIS — R7301 Impaired fasting glucose: Secondary | ICD-10-CM | POA: Diagnosis not present

## 2016-07-15 ENCOUNTER — Ambulatory Visit: Payer: Self-pay | Admitting: Orthopedic Surgery

## 2016-07-15 NOTE — H&P (Signed)
Jane Howe DOB: 04/15/1946 Married / Language: English / Race: White Female Date of Admission:  07/16/2016 CC:  Right Knee Pain History of Present Illness The patient is a 70 year old female who comes in for a preoperative History and Physical. The patient is scheduled for a right total knee arthroplasty to be performed by Dr. Dione Plover. Aluisio, MD at [facility] on 07/16/2016. The patient is a 70 year old female who presented for follow up of their knee. The patient is being followed for their right knee pain and osteoarthritis. They are now month(s) out from Synvisc series. Symptoms reported include: pain, aching, stiffness, grinding and pain with weightbearing. The patient feels that they are doing poorly and report their pain level to be moderate to severe. Current treatment includes: NSAIDs (Mobic). The following medication has been used for pain control: tramadol. The patient has reported temporary improvement of their symptoms with: viscosupplementation. She is not having any lateral hip pain, or pain in the groin. Unfortunately, her right knee is getting progressively worse. The viscosupplements did not help her. She saw rheumatologist recently and was noted to have worsening deformity of her right knee. She said the knee is definitely limiting what she can and cannot do. She is at a stage now where she would like to be more active, but the knee is preventing her from doing so. Cortisone and viscosupplements have not had benefit. She is now ready to proceed with surgery at this time. They have been treated conservatively in the past for the above stated problem and despite conservative measures, they continue to have progressive pain and severe functional limitations and dysfunction. They have failed non-operative management including home exercise, medications, and injections. It is felt that they would benefit from undergoing total joint replacement. Risks and benefits of the  procedure have been discussed with the patient and they elect to proceed with surgery. There are no active contraindications to surgery such as ongoing infection or rapidly progressive neurological disease.  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 child Cerebrovascular Accident  Maternal Grandmother, Paternal Grandmother. Chronic Obstructive Lung Disease  Mother, Paternal Jon Gills, Paternal Grandmother. Depression  Mother, Sister. child First Degree Relatives  reported Heart Disease  Paternal Grandfather. Heart disease in female family member before age 54  Hypertension  Maternal Grandmother, Mother, Paternal Grandmother. Osteoarthritis  Maternal Grandmother, Mother, Sister. Osteoporosis  Maternal Grandmother, Mother, Paternal 70, Sister.  Social History Number of flights of stairs before winded  2-3 Not under pain contract  Marital status  married Current work status  working full time No history of drug/alcohol rehab  Tobacco use  Former smoker. 11/27/2013: smoke(d) 1 pack(s) per day Exercise  Exercises daily; does other Children  2 Tobacco / smoke exposure  11/27/2013: no Living situation  live with spouse Current drinker  11/27/2013: Currently drinks hard liquor only occasionally per week Advance Directives  Living Will, Healthcare POA  Medication History  Vitamin B Complex Active. Fosamax (Oral)  Specific strength unknown - Active. Plaquenil (  Oral) Specific strength unknown - Active. Calcium Carbonate (600MG  Tablet, Oral) Active. Cyclobenzaprine HCl (10MG  Tablet, Oral) Active. Cymbalta (30MG  Capsule DR Part, Oral) Active. (60 mgs per day) Meloxicam (15MG  Tablet, Oral) Active. MiraLax (Oral) Active. Multivitamins (Oral) Active. Pravastatin Sodium (80MG  Tablet, Oral) Active. PriLOSEC (Oral) Specific strength unknown - Active. Stool Softener (Oral) Specific strength unknown - Active. TraMADol HCl (50MG  Tablet, Oral) Active. TraZODone HCl (50MG  Tablet, Oral) Active. Vitamin D (1000UNIT Tablet, Oral) Active. Xanax (0.25MG  Tablet, Oral) Active.  Past Surgical History Breast Mass; Local Excision  left Hysterectomy  Date: 04/1978. partial (non-cancerous) Appendectomy  Date: 04/1978. along with Hysterectomy Tonsillectomy  Breast Biopsy  left Colon Polyp Removal - Colonoscopy   Review of Systems General Present- Fatigue. Not Present- Chills, Fever, Memory Loss, Night Sweats, Weight Gain and Weight Loss. Skin Not Present- Eczema, Hives, Itching, Lesions and Rash. HEENT Not Present- Dentures, Double Vision, Headache, Hearing Loss, Tinnitus and Visual Loss. Respiratory Not Present- Allergies, Chronic Cough, Coughing up blood, Shortness of breath at rest and Shortness of breath with exertion. Cardiovascular Not Present- Chest Pain, Difficulty Breathing Lying Down, Murmur, Palpitations, Racing/skipping heartbeats and Swelling. Gastrointestinal Present- Constipation and Heartburn. Not Present- Abdominal Pain, Bloody Stool, Diarrhea, Difficulty Swallowing, Jaundice, Loss of appetitie, Nausea and Vomiting. Female Genitourinary Present- Urinating at Night. Not Present- Blood in Urine, Discharge, Flank Pain, Incontinence, Painful Urination, Urgency, Urinary frequency, Urinary Retention and Weak urinary stream. Musculoskeletal Present- Back Pain, Joint Pain, Joint Swelling and  Muscle Weakness. Not Present- Morning Stiffness, Muscle Pain and Spasms. Neurological Present- Tremor. Not Present- Blackout spells, Difficulty with balance, Dizziness, Paralysis and Weakness. Psychiatric Not Present- Insomnia.  Vitals  Weight: 165 lb Height: 61in Weight was reported by patient. Height was reported by patient. Body Surface Area: 1.74 m Body Mass Index: 31.18 kg/m  Pulse: 64 (Regular)  BP: 118/62 (Sitting, Right Arm, Standard  Physical Exam General Mental Status -Alert, cooperative and good historian. General Appearance-pleasant, Not in acute distress. Orientation-Oriented X3. Build & Nutrition-Well nourished and Well developed.  Head and Neck Head-normocephalic, atraumatic . Neck Global Assessment - supple, no bruit auscultated on the right, no bruit auscultated on the left.  Eye Vision-Wears contact lenses. Pupil - Bilateral-Regular and Round. Motion - Bilateral-EOMI.  Chest and Lung Exam Auscultation Breath sounds - clear at anterior chest wall and clear at posterior chest wall. Adventitious sounds - No Adventitious sounds.  Cardiovascular Auscultation Rhythm - Regular rate and rhythm. Heart Sounds - S1 WNL and S2 WNL. Murmurs & Other Heart Sounds - Auscultation of the heart reveals - No Murmurs.  Abdomen Palpation/Percussion Tenderness - Abdomen is non-tender to palpation. Rigidity (guarding) - Abdomen is soft. Auscultation Auscultation of the abdomen reveals - Bowel sounds normal.  Female Genitourinary Note: Not done, not pertinent to present illness   Musculoskeletal Note: On exam, she is alert and oriented, in no apparent distress. Her right hip shows normal range of motion with no discomfort. Right knee shows no effusion. Slight varus deformity. Range 5 to 125, marked crepitus on range of motion, tenderness medial greater than lateral with no instability noted. Pulses, sensation and motor are intact  distally.  RADIOGRAPHS AP both knees and lateral showed that she has bone on bone arthritis in the medial and patellofemoral compartments of the right knee. The left knee is unremarkable.  Assessment & Plan Primary osteoarthritis of one knee, right (M17.11)  Note:Surgical Plans: Right Total Knee Replacement  Disposition: Home with husband Don  PCP: Dr. Cari Caraway  IV TXA  Anesthesia Issues: None  [facility]ITIONAL THERAPY - HHPT  Signed electronically by Ok Edwards, III PA-C

## 2016-07-16 ENCOUNTER — Inpatient Hospital Stay (HOSPITAL_COMMUNITY)
Admission: RE | Admit: 2016-07-16 | Discharge: 2016-07-18 | DRG: 470 | Disposition: A | Discharge status: Home / Self Care | Payer: Medicare Other | Source: Ambulatory Visit | Attending: Orthopedic Surgery | Admitting: Orthopedic Surgery

## 2016-07-16 ENCOUNTER — Encounter (HOSPITAL_COMMUNITY)
Admission: RE | Disposition: A | Discharge status: Home / Self Care | Payer: Self-pay | Source: Ambulatory Visit | Attending: Orthopedic Surgery

## 2016-07-16 ENCOUNTER — Inpatient Hospital Stay (HOSPITAL_COMMUNITY): Payer: Medicare Other | Admitting: Certified Registered Nurse Anesthetist

## 2016-07-16 ENCOUNTER — Encounter (HOSPITAL_COMMUNITY): Payer: Self-pay | Admitting: *Deleted

## 2016-07-16 DIAGNOSIS — M179 Osteoarthritis of knee, unspecified: Secondary | ICD-10-CM | POA: Diagnosis not present

## 2016-07-16 DIAGNOSIS — M1711 Unilateral primary osteoarthritis, right knee: Secondary | ICD-10-CM | POA: Diagnosis not present

## 2016-07-16 DIAGNOSIS — M1710 Unilateral primary osteoarthritis, unspecified knee: Secondary | ICD-10-CM | POA: Diagnosis present

## 2016-07-16 DIAGNOSIS — Z85828 Personal history of other malignant neoplasm of skin: Secondary | ICD-10-CM

## 2016-07-16 DIAGNOSIS — Z87891 Personal history of nicotine dependence: Secondary | ICD-10-CM

## 2016-07-16 DIAGNOSIS — K219 Gastro-esophageal reflux disease without esophagitis: Secondary | ICD-10-CM | POA: Diagnosis present

## 2016-07-16 DIAGNOSIS — M797 Fibromyalgia: Secondary | ICD-10-CM | POA: Diagnosis not present

## 2016-07-16 DIAGNOSIS — E785 Hyperlipidemia, unspecified: Secondary | ICD-10-CM | POA: Diagnosis present

## 2016-07-16 DIAGNOSIS — Z79899 Other long term (current) drug therapy: Secondary | ICD-10-CM | POA: Diagnosis not present

## 2016-07-16 DIAGNOSIS — F329 Major depressive disorder, single episode, unspecified: Secondary | ICD-10-CM | POA: Diagnosis not present

## 2016-07-16 DIAGNOSIS — M25561 Pain in right knee: Secondary | ICD-10-CM | POA: Diagnosis not present

## 2016-07-16 HISTORY — PX: TOTAL KNEE ARTHROPLASTY: SHX125

## 2016-07-16 LAB — TYPE AND SCREEN
ABO/RH(D): B POS
Antibody Screen: NEGATIVE

## 2016-07-16 SURGERY — ARTHROPLASTY, KNEE, TOTAL
Anesthesia: Spinal | Site: Knee | Laterality: Right

## 2016-07-16 MED ORDER — SODIUM CHLORIDE 0.9 % IV SOLN
INTRAVENOUS | Status: DC
  Administered 2016-07-16 – 2016-07-17 (×2): via INTRAVENOUS

## 2016-07-16 MED ORDER — ONDANSETRON HCL 4 MG/2ML IJ SOLN: 4.0000 mg | Freq: Four times a day (QID) | INTRAMUSCULAR | Status: DC | PRN

## 2016-07-16 MED ORDER — DEXAMETHASONE SODIUM PHOSPHATE 10 MG/ML IJ SOLN
10.0000 mg | Freq: Once | INTRAMUSCULAR | Status: AC
  Administered 2016-07-16: 10 mg via INTRAVENOUS

## 2016-07-16 MED ORDER — HYDROMORPHONE HCL 1 MG/ML IJ SOLN: 0.2500 mg | INTRAMUSCULAR | Status: DC | PRN

## 2016-07-16 MED ORDER — 0.9 % SODIUM CHLORIDE (POUR BTL) OPTIME
TOPICAL | Status: DC | PRN
  Administered 2016-07-16: 1000 mL

## 2016-07-16 MED ORDER — TRANEXAMIC ACID 1000 MG/10ML IV SOLN
1000.0000 mg | Freq: Once | INTRAVENOUS | Status: AC
  Administered 2016-07-16: 1000 mg via INTRAVENOUS
  Filled 2016-07-16: qty 10

## 2016-07-16 MED ORDER — PROPRANOLOL HCL 10 MG PO TABS
10.0000 mg | ORAL_TABLET | Freq: Every day | ORAL | Status: DC | PRN
  Filled 2016-07-16: qty 1

## 2016-07-16 MED ORDER — CEFAZOLIN SODIUM-DEXTROSE 2-4 GM/100ML-% IV SOLN
INTRAVENOUS | Status: AC
  Filled 2016-07-16: qty 100

## 2016-07-16 MED ORDER — MIDAZOLAM HCL 5 MG/5ML IJ SOLN
INTRAMUSCULAR | Status: DC | PRN
  Administered 2016-07-16: 2 mg via INTRAVENOUS

## 2016-07-16 MED ORDER — ALPRAZOLAM 0.25 MG PO TABS: 0.2500 mg | ORAL_TABLET | Freq: Every evening | ORAL | Status: DC | PRN

## 2016-07-16 MED ORDER — ACETAMINOPHEN 325 MG PO TABS
650.0000 mg | ORAL_TABLET | Freq: Four times a day (QID) | ORAL | Status: DC | PRN
Start: 2016-07-17 — End: 2016-07-18

## 2016-07-16 MED ORDER — PROPOFOL 500 MG/50ML IV EMUL
INTRAVENOUS | Status: DC | PRN
  Administered 2016-07-16: 50 ug/kg/min via INTRAVENOUS

## 2016-07-16 MED ORDER — ACETAMINOPHEN 650 MG RE SUPP: 650.0000 mg | Freq: Four times a day (QID) | RECTAL | Status: DC | PRN

## 2016-07-16 MED ORDER — SODIUM CHLORIDE 0.9 % IR SOLN
Status: DC | PRN
  Administered 2016-07-16: 1000 mL

## 2016-07-16 MED ORDER — FENTANYL CITRATE (PF) 100 MCG/2ML IJ SOLN
INTRAMUSCULAR | Status: AC
  Filled 2016-07-16: qty 2

## 2016-07-16 MED ORDER — DULOXETINE HCL 30 MG PO CPEP
90.0000 mg | ORAL_CAPSULE | Freq: Every day | ORAL | Status: DC
  Administered 2016-07-17 – 2016-07-18 (×2): 90 mg via ORAL
  Filled 2016-07-16 (×2): qty 3

## 2016-07-16 MED ORDER — DIPHENHYDRAMINE HCL 12.5 MG/5ML PO ELIX: 12.5000 mg | ORAL_SOLUTION | ORAL | Status: DC | PRN

## 2016-07-16 MED ORDER — SODIUM CHLORIDE 0.9 % IJ SOLN
INTRAMUSCULAR | Status: DC | PRN
  Administered 2016-07-16: 30 mL

## 2016-07-16 MED ORDER — PROPOFOL 10 MG/ML IV BOLUS
INTRAVENOUS | Status: AC
  Filled 2016-07-16: qty 40

## 2016-07-16 MED ORDER — METHOCARBAMOL 500 MG PO TABS
500.0000 mg | ORAL_TABLET | Freq: Four times a day (QID) | ORAL | Status: DC | PRN
  Administered 2016-07-17 – 2016-07-18 (×3): 500 mg via ORAL
  Filled 2016-07-16 (×3): qty 1

## 2016-07-16 MED ORDER — METOCLOPRAMIDE HCL 5 MG/ML IJ SOLN: 10.0000 mg | Freq: Once | INTRAMUSCULAR | Status: DC | PRN

## 2016-07-16 MED ORDER — ACETAMINOPHEN 10 MG/ML IV SOLN
INTRAVENOUS | Status: AC
  Filled 2016-07-16: qty 100

## 2016-07-16 MED ORDER — MEPERIDINE HCL 50 MG/ML IJ SOLN: 6.2500 mg | INTRAMUSCULAR | Status: DC | PRN

## 2016-07-16 MED ORDER — PHENOL 1.4 % MT LIQD
1.0000 | OROMUCOSAL | Status: DC | PRN
Start: 2016-07-16 — End: 2016-07-18

## 2016-07-16 MED ORDER — LACTATED RINGERS IV SOLN
INTRAVENOUS | Status: DC
  Administered 2016-07-16 (×2): via INTRAVENOUS

## 2016-07-16 MED ORDER — MORPHINE SULFATE (PF) 2 MG/ML IV SOLN
1.0000 mg | INTRAVENOUS | Status: DC | PRN
  Administered 2016-07-16 – 2016-07-18 (×5): 1 mg via INTRAVENOUS
  Filled 2016-07-16 (×5): qty 1

## 2016-07-16 MED ORDER — BUPIVACAINE HCL (PF) 0.25 % IJ SOLN
INTRAMUSCULAR | Status: AC
  Filled 2016-07-16: qty 30

## 2016-07-16 MED ORDER — BUPIVACAINE LIPOSOME 1.3 % IJ SUSP
20.0000 mL | Freq: Once | INTRAMUSCULAR | Status: DC
  Filled 2016-07-16: qty 20

## 2016-07-16 MED ORDER — TRAZODONE HCL 50 MG PO TABS
100.0000 mg | ORAL_TABLET | Freq: Every day | ORAL | Status: DC
  Administered 2016-07-16 – 2016-07-17 (×2): 100 mg via ORAL
  Filled 2016-07-16 (×2): qty 2

## 2016-07-16 MED ORDER — FLEET ENEMA 7-19 GM/118ML RE ENEM
1.0000 | ENEMA | Freq: Once | RECTAL | Status: DC | PRN
Start: 2016-07-16 — End: 2016-07-18

## 2016-07-16 MED ORDER — ACETAMINOPHEN 500 MG PO TABS
1000.0000 mg | ORAL_TABLET | Freq: Four times a day (QID) | ORAL | Status: AC
  Administered 2016-07-16 – 2016-07-17 (×4): 1000 mg via ORAL
  Filled 2016-07-16 (×4): qty 2

## 2016-07-16 MED ORDER — SODIUM CHLORIDE 0.9 % IJ SOLN
INTRAMUSCULAR | Status: AC
  Filled 2016-07-16: qty 50

## 2016-07-16 MED ORDER — DEXAMETHASONE SODIUM PHOSPHATE 10 MG/ML IJ SOLN
10.0000 mg | Freq: Once | INTRAMUSCULAR | Status: AC
  Administered 2016-07-17: 10 mg via INTRAVENOUS
  Filled 2016-07-16: qty 1

## 2016-07-16 MED ORDER — POLYETHYLENE GLYCOL 3350 17 G PO PACK: 17.0000 g | PACK | Freq: Every day | ORAL | Status: DC | PRN

## 2016-07-16 MED ORDER — CEFAZOLIN SODIUM-DEXTROSE 2-4 GM/100ML-% IV SOLN
2.0000 g | INTRAVENOUS | Status: AC
  Administered 2016-07-16: 2 g via INTRAVENOUS
  Filled 2016-07-16: qty 100

## 2016-07-16 MED ORDER — RIVAROXABAN 10 MG PO TABS
10.0000 mg | ORAL_TABLET | Freq: Every day | ORAL | Status: DC
  Administered 2016-07-17 – 2016-07-18 (×2): 10 mg via ORAL
  Filled 2016-07-16 (×2): qty 1

## 2016-07-16 MED ORDER — FENTANYL CITRATE (PF) 100 MCG/2ML IJ SOLN
INTRAMUSCULAR | Status: DC | PRN
  Administered 2016-07-16 (×2): 50 ug via INTRAVENOUS

## 2016-07-16 MED ORDER — FENTANYL CITRATE (PF) 100 MCG/2ML IJ SOLN
25.0000 ug | INTRAMUSCULAR | Status: DC | PRN
  Administered 2016-07-16: 25 ug via INTRAVENOUS
  Administered 2016-07-16: 50 ug via INTRAVENOUS

## 2016-07-16 MED ORDER — ACETAMINOPHEN 10 MG/ML IV SOLN
1000.0000 mg | Freq: Once | INTRAVENOUS | Status: AC
  Administered 2016-07-16: 1000 mg via INTRAVENOUS
  Filled 2016-07-16: qty 100

## 2016-07-16 MED ORDER — DOCUSATE SODIUM 100 MG PO CAPS
100.0000 mg | ORAL_CAPSULE | Freq: Two times a day (BID) | ORAL | Status: DC
  Administered 2016-07-16 – 2016-07-18 (×4): 100 mg via ORAL
  Filled 2016-07-16 (×4): qty 1

## 2016-07-16 MED ORDER — CYCLOBENZAPRINE HCL 10 MG PO TABS
20.0000 mg | ORAL_TABLET | Freq: Every day | ORAL | Status: DC
  Administered 2016-07-16 – 2016-07-17 (×2): 20 mg via ORAL
  Filled 2016-07-16 (×2): qty 2

## 2016-07-16 MED ORDER — PRAVASTATIN SODIUM 80 MG PO TABS
80.0000 mg | ORAL_TABLET | Freq: Every day | ORAL | Status: DC
  Administered 2016-07-16 – 2016-07-17 (×2): 80 mg via ORAL
  Filled 2016-07-16 (×2): qty 1

## 2016-07-16 MED ORDER — PROPOFOL 10 MG/ML IV BOLUS
INTRAVENOUS | Status: DC | PRN
  Administered 2016-07-16 (×2): 10 mg via INTRAVENOUS

## 2016-07-16 MED ORDER — MIDAZOLAM HCL 2 MG/2ML IJ SOLN
INTRAMUSCULAR | Status: AC
  Filled 2016-07-16: qty 2

## 2016-07-16 MED ORDER — ONDANSETRON HCL 4 MG/2ML IJ SOLN
INTRAMUSCULAR | Status: AC
  Filled 2016-07-16: qty 2

## 2016-07-16 MED ORDER — BUPIVACAINE LIPOSOME 1.3 % IJ SUSP
INTRAMUSCULAR | Status: DC | PRN
  Administered 2016-07-16: 20 mL

## 2016-07-16 MED ORDER — HYDROMORPHONE HCL 1 MG/ML IJ SOLN
INTRAMUSCULAR | Status: AC
  Filled 2016-07-16: qty 1

## 2016-07-16 MED ORDER — OXYCODONE HCL 5 MG PO TABS
5.0000 mg | ORAL_TABLET | ORAL | Status: DC | PRN
  Administered 2016-07-16 (×2): 10 mg via ORAL
  Administered 2016-07-16: 5 mg via ORAL
  Administered 2016-07-17 (×2): 10 mg via ORAL
  Administered 2016-07-17: 5 mg via ORAL
  Administered 2016-07-17 – 2016-07-18 (×6): 10 mg via ORAL
  Filled 2016-07-16 (×7): qty 2
  Filled 2016-07-16: qty 1
  Filled 2016-07-16 (×3): qty 2
  Filled 2016-07-16: qty 1

## 2016-07-16 MED ORDER — METOCLOPRAMIDE HCL 5 MG PO TABS: 5.0000 mg | ORAL_TABLET | Freq: Three times a day (TID) | ORAL | Status: DC | PRN

## 2016-07-16 MED ORDER — TRANEXAMIC ACID 1000 MG/10ML IV SOLN
1000.0000 mg | INTRAVENOUS | Status: AC
  Administered 2016-07-16: 1000 mg via INTRAVENOUS
  Filled 2016-07-16: qty 10

## 2016-07-16 MED ORDER — PANTOPRAZOLE SODIUM 40 MG PO TBEC
40.0000 mg | DELAYED_RELEASE_TABLET | Freq: Every day | ORAL | Status: DC
  Administered 2016-07-17: 40 mg via ORAL
  Filled 2016-07-16: qty 1

## 2016-07-16 MED ORDER — PROPOFOL 10 MG/ML IV BOLUS
INTRAVENOUS | Status: AC
  Filled 2016-07-16: qty 20

## 2016-07-16 MED ORDER — ONDANSETRON HCL 4 MG PO TABS: 4.0000 mg | ORAL_TABLET | Freq: Four times a day (QID) | ORAL | Status: DC | PRN

## 2016-07-16 MED ORDER — METHOCARBAMOL 1000 MG/10ML IJ SOLN
500.0000 mg | Freq: Four times a day (QID) | INTRAVENOUS | Status: DC | PRN
  Administered 2016-07-16: 500 mg via INTRAVENOUS
  Filled 2016-07-16: qty 5
  Filled 2016-07-16: qty 550

## 2016-07-16 MED ORDER — METOCLOPRAMIDE HCL 5 MG/ML IJ SOLN: 5.0000 mg | Freq: Three times a day (TID) | INTRAMUSCULAR | Status: DC | PRN

## 2016-07-16 MED ORDER — MENTHOL 3 MG MT LOZG
1.0000 | LOZENGE | OROMUCOSAL | Status: DC | PRN
  Filled 2016-07-16: qty 9

## 2016-07-16 MED ORDER — CEFAZOLIN SODIUM-DEXTROSE 2-4 GM/100ML-% IV SOLN
2.0000 g | Freq: Four times a day (QID) | INTRAVENOUS | Status: AC
  Administered 2016-07-16 – 2016-07-17 (×2): 2 g via INTRAVENOUS
  Filled 2016-07-16 (×2): qty 100

## 2016-07-16 MED ORDER — BUPIVACAINE HCL 0.25 % IJ SOLN
INTRAMUSCULAR | Status: DC | PRN
  Administered 2016-07-16: 30 mL

## 2016-07-16 MED ORDER — POLYETHYLENE GLYCOL 3350 17 G PO PACK
17.0000 g | PACK | Freq: Every day | ORAL | Status: DC
Start: 2016-07-17 — End: 2016-07-18
  Administered 2016-07-17 – 2016-07-18 (×2): 17 g via ORAL
  Filled 2016-07-16 (×2): qty 1

## 2016-07-16 MED ORDER — BISACODYL 10 MG RE SUPP: 10.0000 mg | Freq: Every day | RECTAL | Status: DC | PRN

## 2016-07-16 MED ORDER — ONDANSETRON HCL 4 MG/2ML IJ SOLN
INTRAMUSCULAR | Status: DC | PRN
  Administered 2016-07-16: 4 mg via INTRAVENOUS

## 2016-07-16 MED ORDER — STERILE WATER FOR IRRIGATION IR SOLN
Status: DC | PRN
  Administered 2016-07-16: 3000 mL

## 2016-07-16 SURGICAL SUPPLY — 50 items
BAG DECANTER FOR FLEXI CONT (MISCELLANEOUS) ×2 IMPLANT
BAG SPEC THK2 15X12 ZIP CLS (MISCELLANEOUS) ×1
BAG ZIPLOCK 12X15 (MISCELLANEOUS) ×2 IMPLANT
BANDAGE ACE 6X5 VEL STRL LF (GAUZE/BANDAGES/DRESSINGS) ×2 IMPLANT
BLADE SAG 18X100X1.27 (BLADE) ×2 IMPLANT
BLADE SAW SGTL 11.0X1.19X90.0M (BLADE) ×2 IMPLANT
BOWL SMART MIX CTS (DISPOSABLE) ×2 IMPLANT
CAPT KNEE TOTAL 3 ATTUNE ×1 IMPLANT
CEMENT HV SMART SET (Cement) ×4 IMPLANT
CLOTH BEACON ORANGE TIMEOUT ST (SAFETY) ×2 IMPLANT
CUFF TOURN SGL QUICK 34 (TOURNIQUET CUFF) ×2
CUFF TRNQT CYL 34X4X40X1 (TOURNIQUET CUFF) ×1 IMPLANT
DECANTER SPIKE VIAL GLASS SM (MISCELLANEOUS) ×2 IMPLANT
DRAPE U-SHAPE 47X51 STRL (DRAPES) ×2 IMPLANT
DRSG ADAPTIC 3X8 NADH LF (GAUZE/BANDAGES/DRESSINGS) ×2 IMPLANT
DRSG PAD ABDOMINAL 8X10 ST (GAUZE/BANDAGES/DRESSINGS) ×2 IMPLANT
DURAPREP 26ML APPLICATOR (WOUND CARE) ×2 IMPLANT
ELECT REM PT RETURN 9FT ADLT (ELECTROSURGICAL) ×2
ELECTRODE REM PT RTRN 9FT ADLT (ELECTROSURGICAL) ×1 IMPLANT
EVACUATOR 1/8 PVC DRAIN (DRAIN) ×2 IMPLANT
GAUZE SPONGE 4X4 12PLY STRL (GAUZE/BANDAGES/DRESSINGS) ×2 IMPLANT
GLOVE BIO SURGEON STRL SZ7.5 (GLOVE) ×1 IMPLANT
GLOVE BIO SURGEON STRL SZ8 (GLOVE) ×2 IMPLANT
GLOVE BIOGEL PI IND STRL 6.5 (GLOVE) IMPLANT
GLOVE BIOGEL PI IND STRL 8 (GLOVE) ×1 IMPLANT
GLOVE BIOGEL PI INDICATOR 6.5 (GLOVE) ×4
GLOVE BIOGEL PI INDICATOR 8 (GLOVE) ×2
GLOVE SURG SS PI 6.5 STRL IVOR (GLOVE) IMPLANT
GOWN STRL REUS W/TWL LRG LVL3 (GOWN DISPOSABLE) ×3 IMPLANT
GOWN STRL REUS W/TWL XL LVL3 (GOWN DISPOSABLE) ×2 IMPLANT
HANDPIECE INTERPULSE COAX TIP (DISPOSABLE) ×2
IMMOBILIZER KNEE 20 (SOFTGOODS) ×2
IMMOBILIZER KNEE 20 THIGH 36 (SOFTGOODS) ×1 IMPLANT
MANIFOLD NEPTUNE II (INSTRUMENTS) ×2 IMPLANT
NS IRRIG 1000ML POUR BTL (IV SOLUTION) ×2 IMPLANT
PACK TOTAL KNEE CUSTOM (KITS) ×2 IMPLANT
PADDING CAST COTTON 6X4 STRL (CAST SUPPLIES) ×4 IMPLANT
POSITIONER SURGICAL ARM (MISCELLANEOUS) ×2 IMPLANT
SET HNDPC FAN SPRY TIP SCT (DISPOSABLE) ×1 IMPLANT
STRIP CLOSURE SKIN 1/2X4 (GAUZE/BANDAGES/DRESSINGS) ×3 IMPLANT
SUT MNCRL AB 4-0 PS2 18 (SUTURE) ×2 IMPLANT
SUT VIC AB 2-0 CT1 27 (SUTURE) ×6
SUT VIC AB 2-0 CT1 TAPERPNT 27 (SUTURE) ×3 IMPLANT
SUT VLOC 180 0 24IN GS25 (SUTURE) ×2 IMPLANT
SYR 50ML LL SCALE MARK (SYRINGE) ×2 IMPLANT
TRAY FOLEY W/METER SILVER 14FR (SET/KITS/TRAYS/PACK) ×2 IMPLANT
TRAY FOLEY W/METER SILVER 16FR (SET/KITS/TRAYS/PACK) ×1 IMPLANT
WATER STERILE IRR 1500ML POUR (IV SOLUTION) ×2 IMPLANT
WRAP KNEE MAXI GEL POST OP (GAUZE/BANDAGES/DRESSINGS) ×2 IMPLANT
YANKAUER SUCT BULB TIP 10FT TU (MISCELLANEOUS) ×2 IMPLANT

## 2016-07-16 NOTE — H&P (View-Only) (Signed)
Jane Howe DOB: 1946/01/31 Married / Language: English / Race: White Female Date of Admission:  07/16/2016 CC:  Right Knee Pain History of Present Illness The patient is a 70 year old female who comes in for a preoperative History and Physical. The patient is scheduled for a right total knee arthroplasty to be performed by Dr. Dione Plover. Aluisio, MD at [facility] on 07/16/2016. The patient is a 70 year old female who presented for follow up of their knee. The patient is being followed for their right knee pain and osteoarthritis. They are now month(s) out from Synvisc series. Symptoms reported include: pain, aching, stiffness, grinding and pain with weightbearing. The patient feels that they are doing poorly and report their pain level to be moderate to severe. Current treatment includes: NSAIDs (Mobic). The following medication has been used for pain control: tramadol. The patient has reported temporary improvement of their symptoms with: viscosupplementation. She is not having any lateral hip pain, or pain in the groin. Unfortunately, her right knee is getting progressively worse. The viscosupplements did not help her. She saw rheumatologist recently and was noted to have worsening deformity of her right knee. She said the knee is definitely limiting what she can and cannot do. She is at a stage now where she would like to be more active, but the knee is preventing her from doing so. Cortisone and viscosupplements have not had benefit. She is now ready to proceed with surgery at this time. They have been treated conservatively in the past for the above stated problem and despite conservative measures, they continue to have progressive pain and severe functional limitations and dysfunction. They have failed non-operative management including home exercise, medications, and injections. It is felt that they would benefit from undergoing total joint replacement. Risks and benefits of the  procedure have been discussed with the patient and they elect to proceed with surgery. There are no active contraindications to surgery such as ongoing infection or rapidly progressive neurological disease.  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 child Cerebrovascular Accident  Maternal Grandmother, Paternal Grandmother. Chronic Obstructive Lung Disease  Mother, Paternal Jon Gills, Paternal Grandmother. Depression  Mother, Sister. child First Degree Relatives  reported Heart Disease  Paternal Grandfather. Heart disease in female family member before age 10  Hypertension  Maternal Grandmother, Mother, Paternal Grandmother. Osteoarthritis  Maternal Grandmother, Mother, Sister. Osteoporosis  Maternal Grandmother, Mother, Paternal 72, Sister.  Social History Number of flights of stairs before winded  2-3 Not under pain contract  Marital status  married Current work status  working full time No history of drug/alcohol rehab  Tobacco use  Former smoker. 11/27/2013: smoke(d) 1 pack(s) per day Exercise  Exercises daily; does other Children  2 Tobacco / smoke exposure  11/27/2013: no Living situation  live with spouse Current drinker  11/27/2013: Currently drinks hard liquor only occasionally per week Advance Directives  Living Will, Healthcare POA  Medication History  Vitamin B Complex Active. Fosamax (Oral)  Specific strength unknown - Active. Plaquenil (  Oral) Specific strength unknown - Active. Calcium Carbonate (600MG  Tablet, Oral) Active. Cyclobenzaprine HCl (10MG  Tablet, Oral) Active. Cymbalta (30MG  Capsule DR Part, Oral) Active. (60 mgs per day) Meloxicam (15MG  Tablet, Oral) Active. MiraLax (Oral) Active. Multivitamins (Oral) Active. Pravastatin Sodium (80MG  Tablet, Oral) Active. PriLOSEC (Oral) Specific strength unknown - Active. Stool Softener (Oral) Specific strength unknown - Active. TraMADol HCl (50MG  Tablet, Oral) Active. TraZODone HCl (50MG  Tablet, Oral) Active. Vitamin D (1000UNIT Tablet, Oral) Active. Xanax (0.25MG  Tablet, Oral) Active.  Past Surgical History Breast Mass; Local Excision  left Hysterectomy  Date: 04/1978. partial (non-cancerous) Appendectomy  Date: 04/1978. along with Hysterectomy Tonsillectomy  Breast Biopsy  left Colon Polyp Removal - Colonoscopy   Review of Systems General Present- Fatigue. Not Present- Chills, Fever, Memory Loss, Night Sweats, Weight Gain and Weight Loss. Skin Not Present- Eczema, Hives, Itching, Lesions and Rash. HEENT Not Present- Dentures, Double Vision, Headache, Hearing Loss, Tinnitus and Visual Loss. Respiratory Not Present- Allergies, Chronic Cough, Coughing up blood, Shortness of breath at rest and Shortness of breath with exertion. Cardiovascular Not Present- Chest Pain, Difficulty Breathing Lying Down, Murmur, Palpitations, Racing/skipping heartbeats and Swelling. Gastrointestinal Present- Constipation and Heartburn. Not Present- Abdominal Pain, Bloody Stool, Diarrhea, Difficulty Swallowing, Jaundice, Loss of appetitie, Nausea and Vomiting. Female Genitourinary Present- Urinating at Night. Not Present- Blood in Urine, Discharge, Flank Pain, Incontinence, Painful Urination, Urgency, Urinary frequency, Urinary Retention and Weak urinary stream. Musculoskeletal Present- Back Pain, Joint Pain, Joint Swelling and  Muscle Weakness. Not Present- Morning Stiffness, Muscle Pain and Spasms. Neurological Present- Tremor. Not Present- Blackout spells, Difficulty with balance, Dizziness, Paralysis and Weakness. Psychiatric Not Present- Insomnia.  Vitals  Weight: 165 lb Height: 61in Weight was reported by patient. Height was reported by patient. Body Surface Area: 1.74 m Body Mass Index: 31.18 kg/m  Pulse: 64 (Regular)  BP: 118/62 (Sitting, Right Arm, Standard  Physical Exam General Mental Status -Alert, cooperative and good historian. General Appearance-pleasant, Not in acute distress. Orientation-Oriented X3. Build & Nutrition-Well nourished and Well developed.  Head and Neck Head-normocephalic, atraumatic . Neck Global Assessment - supple, no bruit auscultated on the right, no bruit auscultated on the left.  Eye Vision-Wears contact lenses. Pupil - Bilateral-Regular and Round. Motion - Bilateral-EOMI.  Chest and Lung Exam Auscultation Breath sounds - clear at anterior chest wall and clear at posterior chest wall. Adventitious sounds - No Adventitious sounds.  Cardiovascular Auscultation Rhythm - Regular rate and rhythm. Heart Sounds - S1 WNL and S2 WNL. Murmurs & Other Heart Sounds - Auscultation of the heart reveals - No Murmurs.  Abdomen Palpation/Percussion Tenderness - Abdomen is non-tender to palpation. Rigidity (guarding) - Abdomen is soft. Auscultation Auscultation of the abdomen reveals - Bowel sounds normal.  Female Genitourinary Note: Not done, not pertinent to present illness   Musculoskeletal Note: On exam, she is alert and oriented, in no apparent distress. Her right hip shows normal range of motion with no discomfort. Right knee shows no effusion. Slight varus deformity. Range 5 to 125, marked crepitus on range of motion, tenderness medial greater than lateral with no instability noted. Pulses, sensation and motor are intact  distally.  RADIOGRAPHS AP both knees and lateral showed that she has bone on bone arthritis in the medial and patellofemoral compartments of the right knee. The left knee is unremarkable.  Assessment & Plan Primary osteoarthritis of one knee, right (M17.11)  Note:Surgical Plans: Right Total Knee Replacement  Disposition: Home with husband Don  PCP: Dr. Cari Caraway  IV TXA  Anesthesia Issues: None  [facility] - [facility]  Signed electronically by Ok Edwards, III PA-C

## 2016-07-16 NOTE — Op Note (Signed)
Pre-operative diagnosis- Osteoarthritis  Right knee(s)  Post-operative diagnosis- Osteoarthritis Right knee(s)  Procedure-  Right  Total Knee Arthroplasty  Surgeon- Dione Plover. Deni Berti, MD  Assistant- Arlee Muslim, PA-C   Anesthesia-  Spinal  EBL-* No blood loss amount entered *   Drains Hemovac  Tourniquet time-  Total Tourniquet Time Documented: Thigh (Right) - 29 minutes Total: Thigh (Right) - 29 minutes     Complications- None  Condition-PACU - hemodynamically stable.   Brief Clinical Note  Jane Howe is a 70 y.o. year old female with end stage OA of her right knee with progressively worsening pain and dysfunction. She has constant pain, with activity and at rest and significant functional deficits with difficulties even with ADLs. She has had extensive non-op management including analgesics, injections of cortisone, and home exercise program, but remains in significant pain with significant dysfunction.Radiographs show bone on bone arthritis medial and patellofemoral. She presents now for right Total Knee Arthroplasty.    Procedure in detail---   The patient is brought into the operating room and positioned supine on the operating table. After successful administration of  Spinal,   a tourniquet is placed high on the  Right thigh(s) and the lower extremity is prepped and draped in the usual sterile fashion. Time out is performed by the operating team and then the  Right lower extremity is wrapped in Esmarch, knee flexed and the tourniquet inflated to 300 mmHg.       A midline incision is made with a ten blade through the subcutaneous tissue to the level of the extensor mechanism. A fresh blade is used to make a medial parapatellar arthrotomy. Soft tissue over the proximal medial tibia is subperiosteally elevated to the joint line with a knife and into the semimembranosus bursa with a Cobb elevator. Soft tissue over the proximal lateral tibia is elevated with attention being  paid to avoiding the patellar tendon on the tibial tubercle. The patella is everted, knee flexed 90 degrees and the ACL and PCL are removed. Findings are bone on bone medial and patellofemoral with large medial and patellar osteophytes.        The drill is used to create a starting hole in the distal femur and the canal is thoroughly irrigated with sterile saline to remove the fatty contents. The 5 degree Right  valgus alignment guide is placed into the femoral canal and the distal femoral cutting block is pinned to remove 9 mm off the distal femur. Resection is made with an oscillating saw.      The tibia is subluxed forward and the menisci are removed. The extramedullary alignment guide is placed referencing proximally at the medial aspect of the tibial tubercle and distally along the second metatarsal axis and tibial crest. The block is pinned to remove 56mm off the more deficient medial  side. Resection is made with an oscillating saw. Size 4is the most appropriate size for the tibia and the proximal tibia is prepared with the modular drill and keel punch for that size.      The femoral sizing guide is placed and size 5 is most appropriate. Rotation is marked off the epicondylar axis and confirmed by creating a rectangular flexion gap at 90 degrees. The size 5 cutting block is pinned in this rotation and the anterior, posterior and chamfer cuts are made with the oscillating saw. The intercondylar block is then placed and that cut is made.      Trial size 4 tibial component, trial  size 5 posterior stabilized femur and a 8  mm posterior stabilized rotating platform insert trial is placed. Full extension is achieved with excellent varus/valgus and anterior/posterior balance throughout full range of motion. The patella is everted and thickness measured to be 22  mm. Free hand resection is taken to 12 mm, a 35 template is placed, lug holes are drilled, trial patella is placed, and it tracks normally. Osteophytes  are removed off the posterior femur with the trial in place. All trials are removed and the cut bone surfaces prepared with pulsatile lavage. Cement is mixed and once ready for implantation, the size 4 tibial implant, size  5 posterior stabilized femoral component, and the size 35 patella are cemented in place and the patella is held with the clamp. The trial insert is placed and the knee held in full extension. The Exparel (20 ml mixed with 30 ml saline) and .25% Bupivicaine, are injected into the extensor mechanism, posterior capsule, medial and lateral gutters and subcutaneous tissues.  All extruded cement is removed and once the cement is hard the permanent 8 mm posterior stabilized rotating platform insert is placed into the tibial tray.      The wound is copiously irrigated with saline solution and the extensor mechanism closed over a hemovac drain with #1 V-loc suture. The tourniquet is released for a total tourniquet time of 29  minutes. Flexion against gravity is 140 degrees and the patella tracks normally. Subcutaneous tissue is closed with 2.0 vicryl and subcuticular with running 4.0 Monocryl. The incision is cleaned and dried and steri-strips and a bulky sterile dressing are applied. The limb is placed into a knee immobilizer and the patient is awakened and transported to recovery in stable condition.      Please note that a surgical assistant was a medical necessity for this procedure in order to perform it in a safe and expeditious manner. Surgical assistant was necessary to retract the ligaments and vital neurovascular structures to prevent injury to them and also necessary for proper positioning of the limb to allow for anatomic placement of the prosthesis.   Dione Plover Jane Both, MD    07/16/2016, 1:47 PM

## 2016-07-16 NOTE — Transfer of Care (Signed)
Immediate Anesthesia Transfer of Care Note  Patient: Jane Howe  Procedure(s) Performed: Procedure(s): RIGHT TOTAL KNEE ARTHROPLASTY (Right)  Patient Location: PACU  Anesthesia Type:Spinal  Level of Consciousness:  sedated, patient cooperative and responds to stimulation  Airway & Oxygen Therapy:Patient Spontanous Breathing and Patient connected to face mask oxgen  Post-op Assessment:  Report given to PACU RN and Post -op Vital signs reviewed and stable  Post vital signs:  Reviewed and stable, spinal s1, no pain.  Last Vitals:  Vitals:   07/16/16 1006  BP: 135/64  Pulse: 66  Resp: 18  Temp: [id number] C    Complications: No apparent anesthesia complications

## 2016-07-16 NOTE — Interval H&P Note (Signed)
History and Physical Interval Note:  07/16/2016 10:51 AM  Jane Howe  has presented today for surgery, with the diagnosis of RIGHT KNEE OA  The various methods of treatment have been discussed with the patient and family. After consideration of risks, benefits and other options for treatment, the patient has consented to  Procedure(s): RIGHT TOTAL KNEE ARTHROPLASTY (Right) as a surgical intervention .  The patient's history has been reviewed, patient examined, no change in status, stable for surgery.  I have reviewed the patient's chart and labs.  Questions were answered to the patient's satisfaction.     Gearlean Alf

## 2016-07-16 NOTE — Anesthesia Preprocedure Evaluation (Addendum)
Anesthesia Evaluation  Patient identified by MRN, date of birth, ID band Patient awake    Reviewed: Allergy & Precautions, NPO status , Patient's Chart, lab work & pertinent test results  Airway Mallampati: II  TM Distance: >3 FB Neck ROM: Full    Dental no notable dental hx.    Pulmonary former smoker,    Pulmonary exam normal breath sounds clear to auscultation       Cardiovascular negative cardio ROS Normal cardiovascular exam Rhythm:Regular Rate:Normal     Neuro/Psych negative neurological ROS  negative psych ROS   GI/Hepatic Neg liver ROS, GERD  Medicated and Controlled,  Endo/Other  negative endocrine ROS  Renal/GU negative Renal ROS  negative genitourinary   Musculoskeletal  (+) Fibromyalgia -  Abdominal   Peds negative pediatric ROS (+)  Hematology negative hematology ROS (+)   Anesthesia Other Findings   Reproductive/Obstetrics negative OB ROS                             Anesthesia Physical Anesthesia Plan  ASA: II  Anesthesia Plan: Spinal   Post-op Pain Management:    Induction: Intravenous  Airway Management Planned: Simple Face Mask  Additional Equipment:   Intra-op Plan:   Post-operative Plan:   Informed Consent: I have reviewed the patients History and Physical, chart, labs and discussed the procedure including the risks, benefits and alternatives for the proposed anesthesia with the patient or authorized representative who has indicated his/her understanding and acceptance.   Dental advisory given  Plan Discussed with: CRNA  Anesthesia Plan Comments:         Anesthesia Quick Evaluation

## 2016-07-16 NOTE — Anesthesia Postprocedure Evaluation (Signed)
Anesthesia Post Note  Patient: Jane Howe  Procedure(s) Performed: Procedure(s) (LRB): RIGHT TOTAL KNEE ARTHROPLASTY (Right)  Patient location during evaluation: PACU Anesthesia Type: Spinal Level of consciousness: awake and alert Pain management: pain level controlled Vital Signs Assessment: post-procedure vital signs reviewed and stable Respiratory status: spontaneous breathing and respiratory function stable Cardiovascular status: blood pressure returned to baseline and stable Postop Assessment: no headache, no backache and spinal receding Anesthetic complications: no    Last Vitals:  Vitals:   07/16/16 1445 07/16/16 1500  BP:  (!) 117/51  Pulse:  63  Resp: 15 15  Temp:  36.9 C    Last Pain:  Vitals:   07/16/16 1500  TempSrc:   PainSc: 3                  [name]

## 2016-07-16 NOTE — Anesthesia Procedure Notes (Signed)
Spinal  Patient location during procedure: OR Start time: 07/16/2016 1:49 PM End time: 07/16/2016 1:56 PM Staffing Anesthesiologist: [name] Resident/CRNA: Darlys Gales R Performed: resident/CRNA  Preanesthetic Checklist Completed: patient identified, site marked, surgical consent, pre-op evaluation, timeout performed, IV checked, risks and benefits discussed and monitors and equipment checked Spinal Block Patient position: sitting Prep: Betadine Patient monitoring: heart rate, continuous pulse ox and blood pressure Approach: midline Location: L3-4 Injection technique: single-shot Needle Needle type: Spinocan  Needle gauge: 22 G Needle length: 9 cm Needle insertion depth: 7.5 cm Assessment Sensory level: T6 Additional Notes Expiration date of kit checked and confirmed. Patient tolerated procedure well, without complications.

## 2016-07-17 LAB — CBC
HCT: 36.8 % (ref 36.0–46.0)
Hemoglobin: 12.2 g/dL (ref 12.0–15.0)
MCH: 29.8 pg (ref 26.0–34.0)
MCHC: 33.2 g/dL (ref 30.0–36.0)
MCV: 90 fL (ref 78.0–100.0)
Platelets: 199 10*3/uL (ref 150–400)
RBC: 4.09 MIL/uL (ref 3.87–5.11)
RDW: 12.8 % (ref 11.5–15.5)
WBC: 13.8 10*3/uL — ABNORMAL HIGH (ref 4.0–10.5)

## 2016-07-17 LAB — BASIC METABOLIC PANEL
Anion gap: 4 — ABNORMAL LOW (ref 5–15)
BUN: 12 mg/dL (ref 6–20)
CO2: 28 mmol/L (ref 22–32)
Calcium: 8.3 mg/dL — ABNORMAL LOW (ref 8.9–10.3)
Chloride: 109 mmol/L (ref 101–111)
Creatinine, Ser: 0.66 mg/dL (ref 0.44–1.00)
GFR calc Af Amer: >60 mL/min (ref >60)
GFR calc non Af Amer: >60 mL/min (ref >60)
Glucose, Bld: 135 mg/dL — ABNORMAL HIGH (ref 65–99)
Potassium: 5.4 mmol/L — ABNORMAL HIGH (ref 3.5–5.1)
Sodium: 141 mmol/L (ref 135–145)

## 2016-07-17 MED ORDER — OXYCODONE HCL 5 MG PO TABS: 5.0000 mg | ORAL_TABLET | ORAL | 0 refills | Status: DC | PRN

## 2016-07-17 MED ORDER — RIVAROXABAN 10 MG PO TABS: 10.0000 mg | ORAL_TABLET | Freq: Every day | ORAL | 0 refills | Status: DC

## 2016-07-17 MED ORDER — OMEPRAZOLE 20 MG PO CPDR
20.0000 mg | DELAYED_RELEASE_CAPSULE | Freq: Every day | ORAL | Status: DC
  Administered 2016-07-17 – 2016-07-18 (×2): 20 mg via ORAL
  Filled 2016-07-17 (×2): qty 1

## 2016-07-17 MED ORDER — TRAMADOL HCL 50 MG PO TABS: 50.0000 mg | ORAL_TABLET | Freq: Four times a day (QID) | ORAL | 1 refills | Status: DC | PRN

## 2016-07-17 MED ORDER — CYCLOBENZAPRINE HCL 10 MG PO TABS: 10.0000 mg | ORAL_TABLET | Freq: Three times a day (TID) | ORAL | 0 refills | Status: DC | PRN

## 2016-07-17 MED ORDER — NON FORMULARY: 20.0000 mg | Freq: Every day | Status: DC

## 2016-07-17 NOTE — Discharge Instructions (Addendum)
° °Dr. Frank Aluisio °Total Joint Specialist °[facility] °[address] °[place], [place] [address] °[phone] ° °TOTAL KNEE REPLACEMENT POSTOPERATIVE DIRECTIONS ° °Knee Rehabilitation, Guidelines Following Surgery  °Results after knee surgery are often greatly improved when you follow the exercise, range of motion and muscle strengthening exercises prescribed by your doctor. Safety measures are also important to protect the knee from further injury. Any time any of these exercises cause you to have increased pain or swelling in your knee joint, decrease the amount until you are comfortable again and slowly increase them. If you have problems or questions, call your caregiver or physical therapist for advice.  ° °HOME CARE INSTRUCTIONS  °Remove items at home which could result in a fall. This includes throw rugs or furniture in walking pathways.  °· ICE to the affected knee every three hours for 30 minutes at a time and then as needed for pain and swelling.  Continue to use ice on the knee for pain and swelling from surgery. You may notice swelling that will progress down to the foot and ankle.  This is normal after surgery.  Elevate the leg when you are not up walking on it.   °· Continue to use the breathing machine which will help keep your temperature down.  It is common for your temperature to cycle up and down following surgery, especially at night when you are not up moving around and exerting yourself.  The breathing machine keeps your lungs expanded and your temperature down. °· Do not place pillow under knee, focus on keeping the knee straight while resting ° °DIET °You may resume your previous home diet once your are discharged from the hospital. ° °DRESSING / WOUND CARE / SHOWERING °You may shower 3 days after surgery, but keep the wounds dry during showering.  You may use an occlusive plastic wrap (Press'n Seal for example), NO SOAKING/SUBMERGING IN THE BATHTUB.  If the  bandage gets wet, change with a clean dry gauze.  If the incision gets wet, pat the wound dry with a clean towel. °You may start showering once you are discharged home but do not submerge the incision under water. Just pat the incision dry and apply a dry gauze dressing on daily. °Change the surgical dressing daily and reapply a dry dressing each time. ° °ACTIVITY °Walk with your walker as instructed. °Use walker as long as suggested by your caregivers. °Avoid periods of inactivity such as sitting longer than an hour when not asleep. This helps prevent blood clots.  °You may resume a sexual relationship in one month or when given the OK by your doctor.  °You may return to work once you are cleared by your doctor.  °Do not drive a car for 6 weeks or until released by you surgeon.  °Do not drive while taking narcotics. ° °WEIGHT BEARING °Weight bearing as tolerated with assist device (walker, cane, etc) as directed, use it as long as suggested by your surgeon or therapist, typically at least 4-6 weeks. ° °POSTOPERATIVE CONSTIPATION PROTOCOL °Constipation - defined medically as fewer than three stools per week and severe constipation as less than one stool per week. ° °One of the most common issues patients have following surgery is constipation.  Even if you have a regular bowel pattern at home, your normal regimen is likely to be disrupted due to multiple reasons following surgery.  Combination of anesthesia, postoperative narcotics, change in appetite and fluid intake all can affect your bowels.    In order to avoid complications following surgery, here are some recommendations in order to help you during your recovery period. ° °Colace (docusate) - Pick up an over-the-counter form of Colace or another stool softener and take twice a day as long as you are requiring postoperative pain medications.  Take with a full glass of water daily.  If you experience loose stools or diarrhea, hold the colace until you stool forms  back up.  If your symptoms do not get better within 1 week or if they get worse, check with your doctor. ° °Dulcolax (bisacodyl) - Pick up over-the-counter and take as directed by the product packaging as needed to assist with the movement of your bowels.  Take with a full glass of water.  Use this product as needed if not relieved by Colace only.  ° °MiraLax (polyethylene glycol) - Pick up over-the-counter to have on hand.  MiraLax is a solution that will increase the amount of water in your bowels to assist with bowel movements.  Take as directed and can mix with a glass of water, juice, soda, coffee, or tea.  Take if you go more than two days without a movement. °Do not use MiraLax more than once per day. Call your doctor if you are still constipated or irregular after using this medication for 7 days in a row. ° °If you continue to have problems with postoperative constipation, please contact the office for further assistance and recommendations.  If you experience "the worst abdominal pain ever" or develop nausea or vomiting, please contact the office immediatly for further recommendations for treatment. ° °ITCHING ° If you experience itching with your medications, try taking only a single pain pill, or even half a pain pill at a time.  You can also use Benadryl over the counter for itching or also to help with sleep.  ° °TED HOSE STOCKINGS °Wear the elastic stockings on both legs for three weeks following surgery during the day but you may remove then at night for sleeping. ° °MEDICATIONS °See your medication summary on the “After Visit Summary” that the nursing staff will review with you prior to discharge.  You may have some home medications which will be placed on hold until you complete the course of blood thinner medication.  It is important for you to complete the blood thinner medication as prescribed by your surgeon.  Continue your approved medications as instructed at time of  discharge. ° °PRECAUTIONS °If you experience chest pain or shortness of breath - call 911 immediately for transfer to the hospital emergency department.  °If you develop a fever greater that 101 F, purulent drainage from wound, increased redness or drainage from wound, foul odor from the wound/dressing, or calf pain - CONTACT YOUR SURGEON.   °                                                °FOLLOW-UP APPOINTMENTS °Make sure you keep all of your appointments after your operation with your surgeon and caregivers. You should call the office at the above phone number and make an appointment for approximately two weeks after the date of your surgery or on the date instructed by your surgeon outlined in the "After Visit Summary". ° ° °RANGE OF MOTION AND STRENGTHENING EXERCISES  °Rehabilitation of the knee is important following a knee injury or   an operation. After just a few days of immobilization, the muscles of the thigh which control the knee become weakened and shrink (atrophy). Knee exercises are designed to build up the tone and strength of the thigh muscles and to improve knee motion. Often times heat used for twenty to thirty minutes before working out will loosen up your tissues and help with improving the range of motion but do not use heat for the first two weeks following surgery. These exercises can be done on a training (exercise) mat, on the floor, on a table or on a bed. Use what ever works the best and is most comfortable for you Knee exercises include:  °Leg Lifts - While your knee is still immobilized in a splint or cast, you can do straight leg raises. Lift the leg to 60 degrees, hold for 3 sec, and slowly lower the leg. Repeat 10-20 times 2-3 times daily. Perform this exercise against resistance later as your knee gets better.  °Quad and Hamstring Sets - Tighten up the muscle on the front of the thigh (Quad) and hold for 5-10 sec. Repeat this 10-20 times hourly. Hamstring sets are done by pushing the  foot backward against an object and holding for 5-10 sec. Repeat as with quad sets.  °· Leg Slides: Lying on your back, slowly slide your foot toward your buttocks, bending your knee up off the floor (only go as far as is comfortable). Then slowly slide your foot back down until your leg is flat on the floor again. °· Angel Wings: Lying on your back spread your legs to the side as far apart as you can without causing discomfort.  °A rehabilitation program following serious knee injuries can speed recovery and prevent re-injury in the future due to weakened muscles. Contact your doctor or a physical therapist for more information on knee rehabilitation.  ° °IF YOU ARE TRANSFERRED TO A SKILLED REHAB FACILITY °If the patient is transferred to a skilled rehab facility following release from the hospital, a list of the current medications will be sent to the facility for the patient to continue.  When discharged from the skilled rehab facility, please have the facility set up the patient's Home Health Physical Therapy prior to being released. Also, the skilled facility will be responsible for providing the patient with their medications at time of release from the facility to include their pain medication, the muscle relaxants, and their blood thinner medication. If the patient is still at the rehab facility at time of the two week follow up appointment, the skilled rehab facility will also need to assist the patient in arranging follow up appointment in our office and any transportation needs. ° °MAKE SURE YOU:  °Understand these instructions.  °Get help right away if you are not doing well or get worse.  ° ° °Pick up stool softner and laxative for home use following surgery while on pain medications. °Do not submerge incision under water. °Please use good hand washing techniques while changing dressing each day. °May shower starting three days after surgery. °Please use a clean towel to pat the incision dry following  showers. °Continue to use ice for pain and swelling after surgery. °Do not use any lotions or creams on the incision until instructed by your surgeon. ° °Take Xarelto for two and a half more weeks, then discontinue Xarelto. °Once the patient has completed the blood thinner regimen, then take a Baby 81 mg Aspirin daily for three more weeks. ° ° °Information   on my medicine - XARELTO® (Rivaroxaban) ° °This medication education was reviewed with me or my healthcare representative as part of my discharge preparation.  The pharmacist that spoke with me during my hospital stay was:  WOFFORD, DREW A, RPH ° °Why was Xarelto® prescribed for you? °Xarelto® was prescribed for you to reduce the risk of blood clots forming after orthopedic surgery. The medical term for these abnormal blood clots is venous thromboembolism (VTE). ° °What do you need to know about xarelto® ? °Take your Xarelto® ONCE DAILY at the same time every day. °You may take it either with or without food. ° °If you have difficulty swallowing the tablet whole, you may crush it and mix in applesauce just prior to taking your dose. ° °Take Xarelto® exactly as prescribed by your doctor and DO NOT stop taking Xarelto® without talking to the doctor who prescribed the medication.  Stopping without other VTE prevention medication to take the place of Xarelto® may increase your risk of developing a clot. ° °After discharge, you should have regular check-up appointments with your healthcare provider that is prescribing your Xarelto®.   ° °What do you do if you miss a dose? °If you miss a dose, take it as soon as you remember on the same day then continue your regularly scheduled once daily regimen the next day. Do not take two doses of Xarelto® on the same day.  ° °Important Safety Information °A possible side effect of Xarelto® is bleeding. You should call your healthcare provider right away if you experience any of the following: °? Bleeding from an injury or your  nose that does not stop. °? Unusual colored urine (red or dark brown) or unusual colored stools (red or black). °? Unusual bruising for unknown reasons. °? A serious fall or if you hit your head (even if there is no bleeding). ° °Some medicines may interact with Xarelto® and might increase your risk of bleeding while on Xarelto®. To help avoid this, consult your healthcare provider or pharmacist prior to using any new prescription or non-prescription medications, including herbals, vitamins, non-steroidal anti-inflammatory drugs (NSAIDs) and supplements. ° °This website has more information on Xarelto®: www.xarelto.com. ° ° ° °

## 2016-07-17 NOTE — Evaluation (Signed)
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  She will benefit from continued OT in acute setting to increase safety and activity tolerance with adls. Goals are for min guard assistance    Follow Up Recommendations  No OT follow up;Supervision/Assistance - 24 hour    Equipment Recommendations  None recommended by OT    Recommendations for Other Services       Precautions / Restrictions Precautions Precautions: Knee Required Braces or Orthoses: Knee Immobilizer - Right Knee Immobilizer - Right: Discontinue once straight leg raise with < 10 degree lag Restrictions Weight Bearing Restrictions: No RLE Weight Bearing: Weight bearing as tolerated      Mobility Bed Mobility by PT Overal bed mobility: Needs Assistance Bed Mobility: Supine to Sit     Supine to sit: Min assist     General bed mobility comments: oob  Transfers Overall transfer level: Needs assistance Equipment used: Rolling walker (2 wheeled) Transfers: Sit to/from Stand Sit to Stand: Min assist         General transfer comment: Assist to rise, stabilize, control descent. VCs safety, technqiue, hand/LE placement    Balance                                            ADL Overall ADL's : Needs assistance/impaired     Grooming: Set up;Sitting   Upper Body Bathing: Set up;Sitting   Lower Body Bathing: Moderate assistance;Sit to/from stand   Upper Body Dressing : Set up;Sitting   Lower Body Dressing: Maximal assistance;Sit to/from stand   Toilet Transfer:  (could not tolerate this session)             General ADL Comments: Pt does not bear much weight when standing up; cued to place some weight through RLE.  Educated on tub bench vs. sponge bathing and pt plans to sponge  bathe. She has a long sponge and reacher at home.  Husband will assist as needed. Cues for safety with sitting back down     Vision     Perception     Praxis      Pertinent Vitals/Pain Pain Assessment: Faces Faces Pain Scale: Hurts even more Pain Location: R knee Pain Descriptors / Indicators: Aching Pain Intervention(s): Limited activity within patient's tolerance;Monitored during session;Premedicated before session;Repositioned;Ice applied     Hand Dominance     Extremity/Trunk Assessment Upper Extremity Assessment Upper Extremity Assessment: Overall WFL for tasks assessed      Cervical / Trunk Assessment Cervical / Trunk Assessment: Normal   Communication Communication Communication: No difficulties   Cognition Arousal/Alertness: Awake/alert Behavior During Therapy: WFL for tasks assessed/performed Overall Cognitive Status: Within Functional Limits for tasks assessed Area of Impairment: Following commands       Following Commands: Follows multi-step commands with increased time       General Comments: impairments likley due to medication. Pt was very talkative during session as well. Max cueing required. She had some difficulty completing thoughts/finding words.   General Comments       Exercises       Shoulder Instructions      Home Living Family/patient expects to be discharged to:: Private residence Living Arrangements:  Spouse/significant other Available Help at Discharge: Family Type of Home:  (townhome) Home Access: Stairs to enter Technical brewer of Steps: 2 +1   Home Layout: Able to live on main level with bedroom/bathroom     Bathroom Shower/Tub: Tub/shower unit Shower/tub characteristics: Curtain Biochemist, clinical: Standard     Home Equipment: Environmental consultant - 2 wheels;Cane - single point;Bedside commode          Prior Functioning/Environment Level of Independence: Independent             OT Diagnosis: Acute pain   OT  Problem List: Decreased activity tolerance;Pain;Decreased safety awareness;Decreased knowledge of use of DME or AE   OT Treatment/Interventions: Self-care/ADL training;DME and/or AE instruction;Patient/family education    OT Goals(Current goals can be found in the care plan section) Acute Rehab OT Goals Patient Stated Goal: less pain. OT Goal Formulation: With patient Time For Goal Achievement: 07/24/16 Potential to Achieve Goals: Good ADL Goals Pt Will Transfer to Toilet: with min guard assist;ambulating;bedside commode Additional ADL Goal #1: pt will safely maintain standing with min guard while husband assists with adls  OT Frequency: Min 2X/week   Barriers to D/C:            Co-evaluation              End of Session    Activity Tolerance: Patient limited by pain Patient left: in chair;with call bell/phone within reach;with chair alarm set;with family/visitor present   Time: [id number] OT Time Calculation (min): 20 min Charges:  OT General Charges $OT Visit: 1 Procedure OT Evaluation $OT Eval Low Complexity: 1 Procedure G-Codes:    Lorre Opdahl July 21, 2016, 12:46 PM Lesle Chris, OTR/L [phone] 07/21/16

## 2016-07-17 NOTE — Progress Notes (Addendum)
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 Pt responded better to cueing this session.   Follow Up Recommendations  Outpatient PT;Supervision/Assistance - 24 hour     Equipment Recommendations  None recommended by PT (husband will bring in a 2nd walker to see if it works better for pt)    Recommendations for Other Services OT consult     Precautions / Restrictions Precautions Precautions: Knee;Fall Required Braces or Orthoses: Knee Immobilizer - Right Knee Immobilizer - Right: Discontinue once straight leg raise with < 10 degree lag Restrictions Weight Bearing Restrictions: No RLE Weight Bearing: Weight bearing as tolerated    Mobility  Bed Mobility Overal bed mobility: Needs Assistance Bed Mobility: Sit to Supine     Supine to sit: Min assist Sit to supine: Min assist   General bed mobility comments: assist for R LE.   Transfers Overall transfer level: Needs assistance Equipment used: Rolling walker (2 wheeled) Transfers: Sit to/from Stand Sit to Stand: Min assist         General transfer comment: Assist to rise, stabilize, control descent. VCs safety, technqiue, hand/LE placement  Ambulation/Gait Ambulation/Gait assistance: Min assist;Min guard Ambulation Distance (Feet): 35 Feet Assistive device: Rolling walker (2 wheeled) Gait Pattern/deviations: Step-to pattern;Antalgic     General Gait Details: Max VCs for technique, sequencing, step length. Assist to stabilize intermitttently. Multiple brief standing rest breaks needed. Explained to pt that the walker she brought from home may be too tall for her resulting in UE fatigue.    Stairs            Wheelchair Mobility    Modified Rankin (Stroke Patients Only)       Balance                                     Cognition Arousal/Alertness: Awake/alert Behavior During Therapy: WFL for tasks assessed/performed Overall Cognitive Status: Within Functional Limits for tasks assessed Area of Impairment: Following commands       Following Commands: Follows multi-step commands with increased time       General Comments: impairments likley due to medication. Pt was very talkative during session as well. Max cueing required. She had some difficulty completing thoughts/finding words.    Exercises Total Joint Exercises Ankle Circles/Pumps: AROM;Both;10 reps;Supine Quad Sets: AROM;Both;10 reps;Supine Heel Slides: AAROM;Right;10 reps;Supine Hip ABduction/ADduction: AAROM;Right;10 reps;Supine Straight Leg Raises: AAROM;Right;10 reps;Supine Goniometric ROM: ~10-55 degrees    General Comments        Pertinent Vitals/Pain Pain Assessment: Faces Faces Pain Scale: Hurts even more Pain Location: R knee Pain Descriptors / Indicators: Aching;Sore Pain Intervention(s): Monitored during session;Repositioned    Home Living Family/patient expects to be discharged to:: Private residence Living Arrangements: Spouse/significant other Available Help at Discharge: Family         Home Equipment: Gilford Rile - 2 wheels;Cane - single point;Bedside commode      Prior Function Level of Independence: Independent          PT Goals (current goals can now be found in the care plan section) Acute Rehab PT Goals Patient Stated Goal: less pain. PT Goal Formulation: With patient/family Time For Goal Achievement: 07/31/16 Potential to Achieve Goals: Good Progress towards PT goals: Progressing toward  goals    Frequency  7X/week    PT Plan Current plan remains appropriate    Co-evaluation             End of Session Equipment Utilized During Treatment: Gait belt;Right knee immobilizer Activity Tolerance: Patient tolerated treatment well Patient left: in bed;with call bell/phone  within reach     Time: [id number] PT Time Calculation (min) (ACUTE ONLY): 20 min  Charges:  $Gait Training: 8-22 mins                    G Codes:       Weston Anna, MPT Pager: [phone]

## 2016-07-17 NOTE — Discharge Summary (Signed)
Physician Discharge Summary   Patient ID: KELCE BOUTON MRN: [id number] DOB/AGE: 1946/09/18 70 y.o.  Admit date: 07/16/2016 Discharge date: 07/18/2016  Primary Diagnosis:  Osteoarthritis  Right knee(s)  Admission Diagnoses:  Past Medical History:  Diagnosis Date  . Basal cell carcinoma    back  . Depression   . Fibromyalgia   . GERD (gastroesophageal reflux disease)   . Hyperlipidemia   . Lupus ([place])   . Mixed connective tissue disease ([place])   . Osteoarthritis    knee, hand, and feet   Discharge Diagnoses:   Principal Problem:   OA (osteoarthritis) of knee  Estimated body mass index is 31.18 kg/m as calculated from the following:   Height as of this encounter: '5\' 1"'  (1.549 m).   Weight as of this encounter: 74.8 kg (165 lb).  Procedure:  Procedure(s) (LRB): RIGHT TOTAL KNEE ARTHROPLASTY (Right)   Consults: None  HPI: Jane Howe is a 70 y.o. year old female with end stage OA of her right knee with progressively worsening pain and dysfunction. She has constant pain, with activity and at rest and significant functional deficits with difficulties even with ADLs. She has had extensive non-op management including analgesics, injections of cortisone, and home exercise program, but remains in significant pain with significant dysfunction.Radiographs show bone on bone arthritis medial and patellofemoral. She presents now for right Total Knee Arthroplasty.    Laboratory Data: Admission on 07/16/2016  Component Date Value Ref Range Status  . WBC 07/17/2016 13.8* 4.0 - 10.5 K/uL Final  . RBC 07/17/2016 4.09  3.87 - 5.11 MIL/uL Final  . Hemoglobin 07/17/2016 12.2  12.0 - 15.0 g/dL Final  . HCT 07/17/2016 36.8  36.0 - 46.0 % Final  . MCV 07/17/2016 90.0  78.0 - 100.0 fL Final  . MCH 07/17/2016 29.8  26.0 - 34.0 pg Final  . MCHC 07/17/2016 33.2  30.0 - 36.0 g/dL Final  . RDW 07/17/2016 12.8  11.5 - 15.5 % Final  . Platelets 07/17/2016 199  150 - 400 K/uL Final  . Sodium  07/17/2016 141  135 - 145 mmol/L Final  . Potassium 07/17/2016 5.4* 3.5 - 5.1 mmol/L Final  . Chloride 07/17/2016 109  101 - 111 mmol/L Final  . CO2 07/17/2016 28  22 - 32 mmol/L Final  . Glucose, Bld 07/17/2016 135* 65 - 99 mg/dL Final  . BUN 07/17/2016 12  6 - 20 mg/dL Final  . Creatinine, Ser 07/17/2016 0.66  0.44 - 1.00 mg/dL Final  . Calcium 07/17/2016 8.3* 8.9 - 10.3 mg/dL Final  . GFR calc non Af Amer 07/17/2016 >60  >60 mL/min Final  . GFR calc Af Amer 07/17/2016 >60  >60 mL/min Final   Comment: (NOTE) The eGFR has been calculated using the CKD EPI equation. This calculation has not been validated in all clinical situations. eGFR's persistently <60 mL/min signify possible Chronic Kidney Disease.   Georgiann Hahn gap 07/17/2016 4* 5 - 15 Final  Hospital Outpatient Visit on 07/09/2016  Component Date Value Ref Range Status  . aPTT 07/09/2016 31  24 - 37 seconds Final  . WBC 07/09/2016 7.8  4.0 - 10.5 K/uL Final  . RBC 07/09/2016 4.71  3.87 - 5.11 MIL/uL Final  . Hemoglobin 07/09/2016 13.9  12.0 - 15.0 g/dL Final  . HCT 07/09/2016 41.7  36.0 - 46.0 % Final  . MCV 07/09/2016 88.5  78.0 - 100.0 fL Final  . MCH 07/09/2016 29.5  26.0 - 34.0 pg Final  .  MCHC 07/09/2016 33.3  30.0 - 36.0 g/dL Final  . RDW 07/09/2016 12.6  11.5 - 15.5 % Final  . Platelets 07/09/2016 220  150 - 400 K/uL Final  . Sodium 07/09/2016 136  135 - 145 mmol/L Final  . Potassium 07/09/2016 4.7  3.5 - 5.1 mmol/L Final  . Chloride 07/09/2016 103  101 - 111 mmol/L Final  . CO2 07/09/2016 28  22 - 32 mmol/L Final  . Glucose, Bld 07/09/2016 98  65 - 99 mg/dL Final  . BUN 07/09/2016 13  6 - 20 mg/dL Final  . Creatinine, Ser 07/09/2016 0.68  0.44 - 1.00 mg/dL Final  . Calcium 07/09/2016 8.7* 8.9 - 10.3 mg/dL Final  . Total Protein 07/09/2016 6.8  6.5 - 8.1 g/dL Final  . Albumin 07/09/2016 4.2  3.5 - 5.0 g/dL Final  . AST 07/09/2016 20  15 - 41 U/L Final  . ALT 07/09/2016 17  14 - 54 U/L Final  . Alkaline Phosphatase  07/09/2016 50  38 - 126 U/L Final  . Total Bilirubin 07/09/2016 0.6  0.3 - 1.2 mg/dL Final  . GFR calc non Af Amer 07/09/2016 >60  >60 mL/min Final  . GFR calc Af Amer 07/09/2016 >60  >60 mL/min Final   Comment: (NOTE) The eGFR has been calculated using the CKD EPI equation. This calculation has not been validated in all clinical situations. eGFR's persistently <60 mL/min signify possible Chronic Kidney Disease.   . Anion gap 07/09/2016 5  5 - 15 Final  . Prothrombin Time 07/09/2016 13.2  11.6 - 15.2 seconds Final  . INR 07/09/2016 0.98  0.00 - 1.49 Final  . ABO/RH(D) 07/16/2016 B POS   Final  . Antibody Screen 07/16/2016 NEG   Final  . Sample Expiration 07/16/2016 07/19/2016   Final  . Extend sample reason 07/16/2016 NO TRANSFUSIONS OR PREGNANCY IN THE PAST 3 MONTHS   Final  . Color, Urine 07/09/2016 YELLOW  YELLOW Final  . APPearance 07/09/2016 CLEAR  CLEAR Final  . Specific Gravity, Urine 07/09/2016 1.008  1.005 - 1.030 Final  . pH 07/09/2016 6.0  5.0 - 8.0 Final  . Glucose, UA 07/09/2016 NEGATIVE  NEGATIVE mg/dL Final  . Hgb urine dipstick 07/09/2016 NEGATIVE  NEGATIVE Final  . Bilirubin Urine 07/09/2016 NEGATIVE  NEGATIVE Final  . Ketones, ur 07/09/2016 NEGATIVE  NEGATIVE mg/dL Final  . Protein, ur 07/09/2016 NEGATIVE  NEGATIVE mg/dL Final  . Nitrite 07/09/2016 NEGATIVE  NEGATIVE Final  . Leukocytes, UA 07/09/2016 NEGATIVE  NEGATIVE Final  . MRSA, PCR 07/09/2016 NEGATIVE  NEGATIVE Final  . Staphylococcus aureus 07/09/2016 NEGATIVE  NEGATIVE Final   Comment:        The Xpert SA Assay (FDA approved for NASAL specimens in patients over 37 years of age), is one component of a comprehensive surveillance program.  Test performance has been validated by [facility] for patients greater than or equal to 22 year old. It is not intended to diagnose infection nor to guide or monitor treatment.   . ABO/RH(D) 07/09/2016 B POS   Final     X-Rays:No results found.  EKG: Orders  placed or performed in visit on 03/10/14  . EKG 12-Lead     Hospital Course: Jane Howe is a 70 y.o. who was admitted to [facility]. They were brought to the operating room on 07/16/2016 and underwent Procedure(s): RIGHT TOTAL KNEE ARTHROPLASTY.  Patient tolerated the procedure well and was later transferred to the recovery room and then  to the orthopaedic floor for postoperative care.  They were given PO and IV analgesics for pain control following their surgery.  They were given 24 hours of postoperative antibiotics of  Anti-infectives    Start     Dose/Rate Route Frequency Ordered Stop   07/16/16 1900  ceFAZolin (ANCEF) IVPB 2g/100 mL premix     2 g 200 mL/hr over 30 Minutes Intravenous Every 6 hours 07/16/16 1600 07/17/16 0217   07/16/16 0945  ceFAZolin (ANCEF) IVPB 2g/100 mL premix     2 g 200 mL/hr over 30 Minutes Intravenous On call to O.R. 07/16/16 0945 07/16/16 1300     and started on DVT prophylaxis in the form of Xarelto.   PT and OT were ordered for total joint protocol.  Discharge planning consulted to help with postop disposition and equipment needs.  Patient had a tough night on the evening of surgery due to pain.  They started to get up OOB with therapy on day one. Hemovac drain was pulled without difficulty.  Continued to work with therapy into day two.  Dressing was changed on day two and the incision was healing well. Patient was seen in rounds and was ready to go home on POD 2.  Discharge home - straight to outpatient Diet - Cardiac diet Follow up - in 2 weeks Activity - WBAT Disposition - Home Condition Upon Discharge - Good D/C Meds - See DC Summary DVT Prophylaxis - Xarelto  Discharge Instructions    Call MD / Call 911    Complete by:  As directed   If you experience chest pain or shortness of breath, CALL 911 and be transported to the hospital emergency room.  If you develope a fever above 101 F, pus (white drainage) or increased drainage or redness  at the wound, or calf pain, call your surgeon's office.   Change dressing    Complete by:  As directed   Change dressing daily with sterile 4 x 4 inch gauze dressing and apply TED hose. Do not submerge the incision under water.   Constipation Prevention    Complete by:  As directed   Drink plenty of fluids.  Prune juice may be helpful.  You may use a stool softener, such as Colace (over the counter) 100 mg twice a day.  Use MiraLax (over the counter) for constipation as needed.   Diet general    Complete by:  As directed   Discharge instructions    Complete by:  As directed   Pick up stool softner and laxative for home use following surgery while on pain medications. Do not submerge incision under water. Please use good hand washing techniques while changing dressing each day. May shower starting three days after surgery. Please use a clean towel to pat the incision dry following showers. Continue to use ice for pain and swelling after surgery. Do not use any lotions or creams on the incision until instructed by your surgeon.   Postoperative Constipation Protocol  Constipation - defined medically as fewer than three stools per week and severe constipation as less than one stool per week.  One of the most common issues patients have following surgery is constipation.  Even if you have a regular bowel pattern at home, your normal regimen is likely to be disrupted due to multiple reasons following surgery.  Combination of anesthesia, postoperative narcotics, change in appetite and fluid intake all can affect your bowels.  In order to avoid complications following surgery, here are  some recommendations in order to help you during your recovery period.  Colace (docusate) - Pick up an over-the-counter form of Colace or another stool softener and take twice a day as long as you are requiring postoperative pain medications.  Take with a full glass of water daily.  If you experience loose stools or  diarrhea, hold the colace until you stool forms back up.  If your symptoms do not get better within 1 week or if they get worse, check with your doctor.  Dulcolax (bisacodyl) - Pick up over-the-counter and take as directed by the product packaging as needed to assist with the movement of your bowels.  Take with a full glass of water.  Use this product as needed if not relieved by Colace only.   MiraLax (polyethylene glycol) - Pick up over-the-counter to have on hand.  MiraLax is a solution that will increase the amount of water in your bowels to assist with bowel movements.  Take as directed and can mix with a glass of water, juice, soda, coffee, or tea.  Take if you go more than two days without a movement. Do not use MiraLax more than once per day. Call your doctor if you are still constipated or irregular after using this medication for 7 days in a row.  If you continue to have problems with postoperative constipation, please contact the office for further assistance and recommendations.  If you experience "the worst abdominal pain ever" or develop nausea or vomiting, please contact the office immediatly for further recommendations for treatment.   Take Xarelto for two and a half more weeks, then discontinue Xarelto. Once the patient has completed the blood thinner regimen, then take a Baby 81 mg Aspirin daily for three more weeks.   Do not put a pillow under the knee. Place it under the heel.    Complete by:  As directed   Do not sit on low chairs, stoools or toilet seats, as it may be difficult to get up from low surfaces    Complete by:  As directed   Driving restrictions    Complete by:  As directed   No driving until released by the physician.   Increase activity slowly as tolerated    Complete by:  As directed   Lifting restrictions    Complete by:  As directed   No lifting until released by the physician.   Patient may shower    Complete by:  As directed   You may shower without a  dressing once there is no drainage.  Do not wash over the wound.  If drainage remains, do not shower until drainage stops.   TED hose    Complete by:  As directed   Use stockings (TED hose) for 3 weeks on both leg(s).  You may remove them at night for sleeping.   Weight bearing as tolerated    Complete by:  As directed   Laterality:  right   Extremity:  Lower       Medication List    STOP taking these medications   alendronate 70 MG tablet Commonly known as:  FOSAMAX   CALCIUM PO   hydroxychloroquine 200 MG tablet Commonly known as:  PLAQUENIL   meloxicam 15 MG tablet Commonly known as:  MOBIC   multivitamin with minerals Tabs tablet   VITAMIN B COMPLEX PO   vitamin D (CHOLECALCIFEROL) 400 units tablet     TAKE these medications   ALPRAZolam 0.25 MG tablet Commonly  known as:  XANAX Take 0.25 mg by mouth at bedtime as needed for anxiety.   cyclobenzaprine 10 MG tablet Commonly known as:  FLEXERIL Take 1 tablet (10 mg total) by mouth 3 (three) times daily as needed for muscle spasms. What changed:  how much to take  when to take this  reasons to take this   DULoxetine 30 MG capsule Commonly known as:  CYMBALTA Take 90 mg by mouth every morning.   omeprazole 20 MG capsule Commonly known as:  PRILOSEC Take 20 mg by mouth daily.   oxyCODONE 5 MG immediate release tablet Commonly known as:  Oxy IR/ROXICODONE Take 1-2 tablets (5-10 mg total) by mouth every 3 (three) hours as needed for breakthrough pain.   polyethylene glycol packet Commonly known as:  MIRALAX / GLYCOLAX Take 17 g by mouth daily.   pravastatin 80 MG tablet Commonly known as:  PRAVACHOL Take 80 mg by mouth at bedtime.   propranolol 10 MG tablet Commonly known as:  INDERAL Take 10 mg by mouth daily as needed (non-essential tremors).   rivaroxaban 10 MG Tabs tablet Commonly known as:  XARELTO Take 1 tablet (10 mg total) by mouth daily with breakfast. Take Xarelto for two and a half more  weeks, then discontinue Xarelto. Once the patient has completed the blood thinner regimen, then take a Baby 81 mg Aspirin daily for three more weeks.   traMADol 50 MG tablet Commonly known as:  ULTRAM Take 1 tablet (50 mg total) by mouth every 6 (six) hours as needed (mild pain). What changed:  reasons to take this   traZODone 50 MG tablet Commonly known as:  DESYREL Take 100 mg by mouth at bedtime.      Follow-up Information    Gearlean Alf, MD. Schedule an appointment as soon as possible for a visit on 07/31/2016.   Specialty:  Orthopedic Surgery Why:  Call office at [phone] to setup appointment on Tuesday 07/31/2016 with Dr. Wynelle Link. Contact information: [address] [place] [address] [phone]           Signed: Arlee Muslim, PA-C Orthopaedic Surgery 07/17/2016, 10:03 PM

## 2016-07-17 NOTE — Evaluation (Signed)
Physical Therapy Evaluation Patient Details Name: Jane Howe MRN: [id number] DOB: 1945/12/22 Today's Date: 07/17/2016   History of Present Illness  70 yo female s/p R TKA 07/16/16  Clinical Impression  On eval, pt required Min assist for mobility. She walked ~12 feet with RW. Distance limited by pain, fatigue. Max cueing required during session. Will follow and progress activity as tolerated.     Follow Up Recommendations Outpatient PT (per MD) ;Supervision/Assistance - 24 hour    Equipment Recommendations       Recommendations for Other Services OT consult     Precautions / Restrictions Precautions Precautions: Knee Required Braces or Orthoses: Knee Immobilizer - Right Knee Immobilizer - Right: Discontinue once straight leg raise with < 10 degree lag Restrictions Weight Bearing Restrictions: No RLE Weight Bearing: Weight bearing as tolerated      Mobility  Bed Mobility Overal bed mobility: Needs Assistance Bed Mobility: Supine to Sit     Supine to sit: Min assist     General bed mobility comments: assist for R LE  Transfers Overall transfer level: Needs assistance Equipment used: Rolling walker (2 wheeled) Transfers: Sit to/from Stand Sit to Stand: Min assist         General transfer comment: Assist to rise, stabilize, control descent. VCs safety, technqiue, hand/LE placement  Ambulation/Gait Ambulation/Gait assistance: Min assist Ambulation Distance (Feet): 12 Feet Assistive device: Rolling walker (2 wheeled) Gait Pattern/deviations: Step-to pattern;Antalgic;Trunk flexed     General Gait Details: Max VCs for technique, sequencing, step length, and for increased WBing on R LE. Assist to stabilize. Followed with recliner for safety-pt c/o some lightheadedness.  Stairs            Wheelchair Mobility    Modified Rankin (Stroke Patients Only)       Balance                                             Pertinent  Vitals/Pain Pain Assessment: Faces Faces Pain Scale: Hurts even more Pain Location: R knee with activity Pain Descriptors / Indicators: Aching;Sore Pain Intervention(s): Limited activity within patient's tolerance;Ice applied;Repositioned    Home Living Family/patient expects to be discharged to:: Private residence Living Arrangements: Spouse/significant other Available Help at Discharge: Family Type of Home:  (townhome) Home Access: Stairs to enter   Technical brewer of Steps: 2 +1 Home Layout: Able to live on main level with bedroom/bathroom Home Equipment: Walker - 2 wheels;Cane - single point;Bedside commode (tripod cane)      Prior Function Level of Independence: Independent               Hand Dominance        Extremity/Trunk Assessment   Upper Extremity Assessment: Defer to OT evaluation           Lower Extremity Assessment: RLE deficits/detail RLE Deficits / Details: hip flex at least 2/5, moves ankle well    Cervical / Trunk Assessment: Normal  Communication   Communication: No difficulties  Cognition Arousal/Alertness: Awake/alert Behavior During Therapy: WFL for tasks assessed/performed Overall Cognitive Status: Impaired/Different from baseline Area of Impairment: Following commands       Following Commands: Follows multi-step commands with increased time       General Comments: impairments likley due to medication. Pt was very talkative during session as well. Max cueing required. She had some difficulty completing thoughts/finding words.  General Comments      Exercises Total Joint Exercises Ankle Circles/Pumps: AROM;Both;10 reps;Supine Quad Sets: AROM;Both;10 reps;Supine Heel Slides: AAROM;Right;10 reps;Supine Hip ABduction/ADduction: AAROM;Right;10 reps;Supine Straight Leg Raises: AAROM;Right;10 reps;Supine Goniometric ROM: ~10-55 degrees      Assessment/Plan    PT Assessment Patient needs continued PT services  PT  Diagnosis Difficulty walking;Acute pain   PT Problem List Decreased strength;Decreased range of motion;Decreased activity tolerance;Decreased balance;Decreased coordination;Decreased mobility;Decreased knowledge of use of DME;Pain  PT Treatment Interventions DME instruction;Gait training;Stair training;Functional mobility training;Balance training;Therapeutic exercise;Therapeutic activities   PT Goals (Current goals can be found in the Care Plan section) Acute Rehab PT Goals Patient Stated Goal: less pain. PT Goal Formulation: With patient/family Time For Goal Achievement: 07/31/16 Potential to Achieve Goals: Good    Frequency 7X/week   Barriers to discharge        Co-evaluation               End of Session Equipment Utilized During Treatment: Gait belt;Right knee immobilizer Activity Tolerance: Patient limited by pain Patient left: in chair;with call bell/phone within reach;with family/visitor present           Time: [id number] PT Time Calculation (min) (ACUTE ONLY): 28 min   Charges:   PT Evaluation $PT Eval Low Complexity: 1 Procedure PT Treatments $Gait Training: 8-22 mins   PT G Codes:        Weston Anna, MPT Pager: [phone]

## 2016-07-17 NOTE — Progress Notes (Addendum)
   Subjective: 1 Day Post-Op Procedure(s) (LRB): RIGHT TOTAL KNEE ARTHROPLASTY (Right) Patient reports pain as mild and moderate last night.  Better this morning. Patient seen in rounds with Dr. Wynelle Howe. Patient is well, but has had some minor complaints of pain in the knee, requiring pain medications We will start therapy today.  Plan is to go Home after hospital stay.  Objective: Vital signs in last 24 hours: Temp:  [98.1 F (36.7 C)-99.4 F (37.4 C)] 98.3 F (36.8 C) (08/01 0631) Pulse Rate:  [63-76] 71 (08/01 0631) Resp:  [8-18] 15 (08/01 0631) BP: (96-137)/(43-68) 111/45 (08/01 0631) SpO2:  [95 %-100 %] 98 % (08/01 0631) Weight:  [74.8 kg (165 lb)] 74.8 kg (165 lb) (07/31 1006)  Intake/Output from previous day:  Intake/Output Summary (Last 24 hours) at 07/17/16 0843 Last data filed at 07/17/16 [id number]  Gross per 24 hour  Intake          3418.75 ml  Output             1970 ml  Net          1448.75 ml    Intake/Output this shift: No intake/output data recorded.  Labs:  Recent Labs  07/17/16 0421  HGB 12.2    Recent Labs  07/17/16 0421  WBC 13.8*  RBC 4.09  HCT 36.8  PLT 199    Recent Labs  07/17/16 0421  NA 141  K 5.4*  CL 109  CO2 28  BUN 12  CREATININE 0.66  GLUCOSE 135*  CALCIUM 8.3*   No results for input(s): LABPT, INR in the last 72 hours.  EXAM General - Patient is Alert, Appropriate and Oriented Extremity - Neurovascular intact Sensation intact distally Dorsiflexion/Plantar flexion intact Dressing - dressing C/D/I Motor Function - intact, moving foot and toes well on exam.  Hemovac pulled without difficulty.  Past Medical History:  Diagnosis Date  . Basal cell carcinoma    back  . Depression   . Fibromyalgia   . GERD (gastroesophageal reflux disease)   . Hyperlipidemia   . Lupus ([place])   . Mixed connective tissue disease ([place])   . Osteoarthritis    knee, hand, and feet    Assessment/Plan: 1 Day Post-Op Procedure(s)  (LRB): RIGHT TOTAL KNEE ARTHROPLASTY (Right) Principal Problem:   OA (osteoarthritis) of knee  Estimated body mass index is 31.18 kg/m as calculated from the following:   Height as of this encounter: 5\' 1"  (1.549 m).   Weight as of this encounter: 74.8 kg (165 lb). Advance diet Up with therapy Plan for discharge tomorrow Discharge home - Plans to go straight to outpatient therapy  DVT Prophylaxis - Xarelto Weight-Bearing as tolerated to right leg D/C O2 and Pulse OX and try on Room Air  [place], PA-C Orthopaedic Surgery 07/17/2016, 8:43 AM

## 2016-07-17 NOTE — Care Management Note (Signed)
Case Management Note  Patient Details  Name: CYERRA YIM MRN: [id number] Date of Birth: Aug 29, 1946  Subjective/Objective:                  RIGHT TOTAL KNEE ARTHROPLASTY (Right) Action/Plan: Discharge planning Expected Discharge Date:  07/18/16              Expected Discharge Plan:  Home/Self Care  In-House Referral:     Discharge planning Services  CM Consult  Post Acute Care Choice:    Choice offered to:  Patient  DME Arranged:  N/A DME Agency:  NA  HH Arranged:  NA HH Agency:  NA  Status of Service:  Completed, signed off  If discussed at [place] of Stay Meetings, dates discussed:    Additional Comments: Cm met with pt in room to confirm plan is for outpt PT; patient confirms.  Pt states she has all DME at home.  No other CM needs were communicated. Dellie Catholic, RN 07/17/2016, 11:36 AM

## 2016-07-18 LAB — CBC
HCT: 33.9 % — ABNORMAL LOW (ref 36.0–46.0)
Hemoglobin: 11.4 g/dL — ABNORMAL LOW (ref 12.0–15.0)
MCH: 30 pg (ref 26.0–34.0)
MCHC: 33.6 g/dL (ref 30.0–36.0)
MCV: 89.2 fL (ref 78.0–100.0)
Platelets: 156 10*3/uL (ref 150–400)
RBC: 3.8 MIL/uL — ABNORMAL LOW (ref 3.87–5.11)
RDW: 12.9 % (ref 11.5–15.5)
WBC: 10.6 10*3/uL — ABNORMAL HIGH (ref 4.0–10.5)

## 2016-07-18 LAB — BASIC METABOLIC PANEL
Anion gap: 6 (ref 5–15)
BUN: 11 mg/dL (ref 6–20)
CO2: 28 mmol/L (ref 22–32)
Calcium: 8 mg/dL — ABNORMAL LOW (ref 8.9–10.3)
Chloride: 100 mmol/L — ABNORMAL LOW (ref 101–111)
Creatinine, Ser: 0.64 mg/dL (ref 0.44–1.00)
GFR calc Af Amer: >60 mL/min (ref >60)
GFR calc non Af Amer: >60 mL/min (ref >60)
Glucose, Bld: 102 mg/dL — ABNORMAL HIGH (ref 65–99)
Potassium: 3.7 mmol/L (ref 3.5–5.1)
Sodium: 134 mmol/L — ABNORMAL LOW (ref 135–145)

## 2016-07-18 NOTE — Progress Notes (Signed)
   Subjective: 2 Days Post-Op Procedure(s) (LRB): RIGHT TOTAL KNEE ARTHROPLASTY (Right) Patient reports pain as mild.   Patient seen in rounds with Dr. Wynelle Link. Patient is well, but has had some minor complaints of pain in the knee, requiring pain medications Patient is ready to go home after two therapy sessions.  Straight to outpatient therapy.  Objective: Vital signs in last 24 hours: Temp:  [98.5 F (36.9 C)-99.3 F (37.4 C)] 99 F (37.2 C) (08/02 0520) Pulse Rate:  [55-76] 67 (08/02 0520) Resp:  [16-20] 20 (08/02 0520) BP: (109-160)/(43-60) 160/60 (08/02 0520) SpO2:  [95 %-97 %] 97 % (08/02 0520)  Intake/Output from previous day:  Intake/Output Summary (Last 24 hours) at 07/18/16 0726 Last data filed at 07/18/16 0520  Gross per 24 hour  Intake             1200 ml  Output              300 ml  Net              900 ml    Intake/Output this shift: No intake/output data recorded.  Labs:  Recent Labs  07/17/16 0421 07/18/16 0435  HGB 12.2 11.4*    Recent Labs  07/17/16 0421 07/18/16 0435  WBC 13.8* 10.6*  RBC 4.09 3.80*  HCT 36.8 33.9*  PLT 199 156    Recent Labs  07/17/16 0421 07/18/16 0435  NA 141 134*  K 5.4* 3.7  CL 109 100*  CO2 28 28  BUN 12 11  CREATININE 0.66 0.64  GLUCOSE 135* 102*  CALCIUM 8.3* 8.0*   No results for input(s): LABPT, INR in the last 72 hours.  EXAM: General - Patient is Alert, Appropriate and Oriented Extremity - Neurovascular intact Sensation intact distally Dorsiflexion/Plantar flexion intact Incision - clean, dry, no drainage Motor Function - intact, moving foot and toes well on exam.   Assessment/Plan: 2 Days Post-Op Procedure(s) (LRB): RIGHT TOTAL KNEE ARTHROPLASTY (Right) Procedure(s) (LRB): RIGHT TOTAL KNEE ARTHROPLASTY (Right) Past Medical History:  Diagnosis Date  . Basal cell carcinoma    back  . Depression   . Fibromyalgia   . GERD (gastroesophageal reflux disease)   . Hyperlipidemia   . Lupus  ([place])   . Mixed connective tissue disease ([place])   . Osteoarthritis    knee, hand, and feet   Principal Problem:   OA (osteoarthritis) of knee  Estimated body mass index is 31.18 kg/m as calculated from the following:   Height as of this encounter: 5\' 1"  (1.549 m).   Weight as of this encounter: 74.8 kg (165 lb). Up with therapy Discharge home - straight to outpatient Diet - Cardiac diet Follow up - in 2 weeks Activity - WBAT Disposition - Home Condition Upon Discharge - Good D/C Meds - See DC Summary DVT Prophylaxis - Xarelto  Arlee Muslim, PA-C Orthopaedic Surgery 07/18/2016, 7:26 AM

## 2016-07-18 NOTE — Progress Notes (Signed)
Patient discharged.  Educated patient and husband on discharge instructions, follow-up appointment, and medications.  Rx given to husband.  Educated patient and husband on shower instructions, signs of infection, signs of bleeding, when to call a doctor.  Patient and husband stated understanding, AVS signed.  IV removed.  Belongings gathered.  Escorted to car via wheelchair with NT.  No questions or concerns at this time.

## 2016-07-18 NOTE — Progress Notes (Signed)
Occupational Therapy Treatment Patient Details Name: Jane Howe MRN: [id number] DOB: 08-06-1946 Today's Date: 07/18/2016    History of present illness 70 yo female s/p R TKA 07/16/16   OT comments  All OT education completed and pt questions answered. Patient practiced bathing, dressing, grooming, toilet transfer, toileting during session today. No further OT needs and pt plans to d/c home today with assist of family. OT will sign off.   Follow Up Recommendations  No OT follow up;Supervision/Assistance - 24 hour    Equipment Recommendations  None recommended by OT    Recommendations for Other Services      Precautions / Restrictions Precautions Precautions: Knee;Fall Required Braces or Orthoses: Knee Immobilizer - Right Knee Immobilizer - Right: Discontinue once straight leg raise with < 10 degree lag Restrictions Weight Bearing Restrictions: No RLE Weight Bearing: Weight bearing as tolerated       Mobility Bed Mobility            General bed mobility comments: up in recliner  Transfers Overall transfer level: Needs assistance Equipment used: Rolling walker (2 wheeled) Transfers: Sit to/from Stand Sit to Stand: Supervision             Balance                                   ADL Overall ADL's : Needs assistance/impaired Eating/Feeding: Independent;Sitting   Grooming: Oral care;Brushing hair;Supervision/safety;Standing   Upper Body Bathing: Set up;Sitting   Lower Body Bathing: Min guard   Upper Body Dressing : Set up;Sitting   Lower Body Dressing: Minimal assistance;Sit to/from stand   Toilet Transfer: Supervision/safety;Ambulation;BSC;RW   Toileting- Clothing Manipulation and Hygiene: Supervision/safety;Sit to/from stand       Functional mobility during ADLs: Supervision/safety;Rolling walker        Vision                     Perception     Praxis      Cognition   Behavior During Therapy: WFL for  tasks assessed/performed Overall Cognitive Status: Within Functional Limits for tasks assessed                       Extremity/Trunk Assessment               Exercises    Shoulder Instructions       General Comments      Pertinent Vitals/ Pain       Pain Assessment: 0-10 Pain Score: 6  Pain Location: R knee Pain Descriptors / Indicators: Aching;Sore Pain Intervention(s): Monitored during session;Repositioned;Ice applied  Home Living                                          Prior Functioning/Environment              Frequency       Progress Toward Goals  OT Goals(current goals can now be found in the care plan section)  Progress towards OT goals: Goals met/education completed, patient discharged from OT  Acute Rehab OT Goals Patient Stated Goal: less pain.  Plan All goals met and education completed, patient discharged from OT services    Co-evaluation                 End  of Session Equipment Utilized During Treatment: Rolling walker;Right knee immobilizer   Activity Tolerance Patient tolerated treatment well   Patient Left in chair;with call bell/phone within reach;with family/visitor present   Nurse Communication Mobility status        Time: 1275-1700 OT Time Calculation (min): 38 min  Charges: OT General Charges $OT Visit: 1 Procedure OT Treatments $Self Care/Home Management : 38-52 mins  Teryl Mcconaghy A 07/18/2016, 12:32 PM

## 2016-07-18 NOTE — Progress Notes (Signed)
Physical Therapy Treatment Patient Details Name: Jane Howe MRN: [id number] DOB: 03/27/1946 Today's Date: 07/18/2016    History of Present Illness 70 yo female s/p R TKA 07/16/16    PT Comments    Progressing well with mobility. Practiced/reviewed exercises, gait training, and stair training. Issued HEP handout for pt to follow until she begins OP PT. All education completed. Ready to d/c from PT standpoint.   Follow Up Recommendations  Outpatient PT;Supervision/Assistance - 24 hour     Equipment Recommendations  None recommended by PT (family brought in 2nd walker they will use. Cautioned pt/husband about ensuring walker clasp locks open)    Recommendations for Other Services       Precautions / Restrictions Precautions Precautions: Knee;Fall Required Braces or Orthoses: Knee Immobilizer - Right Knee Immobilizer - Right: Discontinue once straight leg raise with < 10 degree lag Restrictions Weight Bearing Restrictions: No RLE Weight Bearing: Weight bearing as tolerated    Mobility  Bed Mobility Overal bed mobility: Needs Assistance Bed Mobility: Supine to Sit     Supine to sit: Min assist     General bed mobility comments: assist for R LE.   Transfers Overall transfer level: Needs assistance Equipment used: Rolling walker (2 wheeled) Transfers: Sit to/from Stand Sit to Stand: Min assist         General transfer comment: small amount of assist to steady. VCs safety, technique, hand/LE placement  Ambulation/Gait Ambulation/Gait assistance: Min guard Ambulation Distance (Feet): 66 Feet (66'x1, 35'x1) Assistive device: Rolling walker (2 wheeled) Gait Pattern/deviations: Step-to pattern     General Gait Details: close guard for safety. VCs safety, sequence.    Stairs Stairs: Yes Stairs assistance: Min assist Stair Management: One rail Left;Step to pattern;Forwards Number of Stairs: 2 (up and over portable steps) General stair comments: VCs safety,  technique, sequence. 1 HHA, 1 rail. Husband present and assisted pt as well.   Wheelchair Mobility    Modified Rankin (Stroke Patients Only)       Balance                                    Cognition Arousal/Alertness: Awake/alert Behavior During Therapy: WFL for tasks assessed/performed Overall Cognitive Status: Within Functional Limits for tasks assessed                      Exercises Total Joint Exercises Ankle Circles/Pumps: AROM;Both;10 reps;Supine Quad Sets: AROM;Both;10 reps;Supine Heel Slides: AAROM;Right;10 reps;Supine Hip ABduction/ADduction: AAROM;Right;10 reps;Supine Straight Leg Raises: AAROM;Right;10 reps;Supine Goniometric ROM: ~10-55 degrees    General Comments        Pertinent Vitals/Pain Pain Assessment: 0-10 Pain Score: 6  Pain Location: R knee Pain Descriptors / Indicators: Aching;Sore Pain Intervention(s): Ice applied;Monitored during session;Repositioned    Home Living                      Prior Function            PT Goals (current goals can now be found in the care plan section) Progress towards PT goals: Progressing toward goals    Frequency  7X/week    PT Plan Current plan remains appropriate    Co-evaluation             End of Session Equipment Utilized During Treatment: Gait belt;Right knee immobilizer Activity Tolerance: Patient tolerated treatment well Patient left: in bed;with call bell/phone within reach  Time: [id number] PT Time Calculation (min) (ACUTE ONLY): 47 min  Charges:  $Gait Training: 23-37 mins $Therapeutic Exercise: 8-22 mins                    G Codes:      Weston Anna, MPT Pager: [phone]

## 2016-07-20 DIAGNOSIS — M25561 Pain in right knee: Secondary | ICD-10-CM | POA: Diagnosis not present

## 2016-07-23 DIAGNOSIS — M25561 Pain in right knee: Secondary | ICD-10-CM | POA: Diagnosis not present

## 2016-07-27 DIAGNOSIS — M25561 Pain in right knee: Secondary | ICD-10-CM | POA: Diagnosis not present

## 2016-07-30 DIAGNOSIS — M25561 Pain in right knee: Secondary | ICD-10-CM | POA: Diagnosis not present

## 2016-07-31 DIAGNOSIS — Z471 Aftercare following joint replacement surgery: Secondary | ICD-10-CM | POA: Diagnosis not present

## 2016-07-31 DIAGNOSIS — Z96641 Presence of right artificial hip joint: Secondary | ICD-10-CM | POA: Diagnosis not present

## 2016-08-01 DIAGNOSIS — M25561 Pain in right knee: Secondary | ICD-10-CM | POA: Diagnosis not present

## 2016-08-03 DIAGNOSIS — M25561 Pain in right knee: Secondary | ICD-10-CM | POA: Diagnosis not present

## 2016-08-07 DIAGNOSIS — M25561 Pain in right knee: Secondary | ICD-10-CM | POA: Diagnosis not present

## 2016-08-09 DIAGNOSIS — M25561 Pain in right knee: Secondary | ICD-10-CM | POA: Diagnosis not present

## 2016-08-14 DIAGNOSIS — M25561 Pain in right knee: Secondary | ICD-10-CM | POA: Diagnosis not present

## 2016-08-16 DIAGNOSIS — M25561 Pain in right knee: Secondary | ICD-10-CM | POA: Diagnosis not present

## 2016-08-23 DIAGNOSIS — Z471 Aftercare following joint replacement surgery: Secondary | ICD-10-CM | POA: Diagnosis not present

## 2016-08-23 DIAGNOSIS — Z96651 Presence of right artificial knee joint: Secondary | ICD-10-CM | POA: Diagnosis not present

## 2016-08-27 DIAGNOSIS — H8110 Benign paroxysmal vertigo, unspecified ear: Secondary | ICD-10-CM | POA: Diagnosis not present

## 2016-08-27 DIAGNOSIS — Z23 Encounter for immunization: Secondary | ICD-10-CM | POA: Diagnosis not present

## 2016-08-27 DIAGNOSIS — Z791 Long term (current) use of non-steroidal anti-inflammatories (NSAID): Secondary | ICD-10-CM | POA: Diagnosis not present

## 2016-08-27 DIAGNOSIS — E559 Vitamin D deficiency, unspecified: Secondary | ICD-10-CM | POA: Diagnosis not present

## 2016-08-27 DIAGNOSIS — E782 Mixed hyperlipidemia: Secondary | ICD-10-CM | POA: Diagnosis not present

## 2016-08-29 ENCOUNTER — Other Ambulatory Visit (HOSPITAL_COMMUNITY): Payer: Self-pay | Admitting: Family Medicine

## 2016-08-29 DIAGNOSIS — R110 Nausea: Secondary | ICD-10-CM

## 2016-08-29 DIAGNOSIS — H8110 Benign paroxysmal vertigo, unspecified ear: Secondary | ICD-10-CM | POA: Diagnosis not present

## 2016-08-29 DIAGNOSIS — M859 Disorder of bone density and structure, unspecified: Secondary | ICD-10-CM | POA: Diagnosis not present

## 2016-08-29 DIAGNOSIS — Z791 Long term (current) use of non-steroidal anti-inflammatories (NSAID): Secondary | ICD-10-CM | POA: Diagnosis not present

## 2016-08-29 DIAGNOSIS — R7301 Impaired fasting glucose: Secondary | ICD-10-CM | POA: Diagnosis not present

## 2016-08-29 DIAGNOSIS — M6089 Other myositis, multiple sites: Secondary | ICD-10-CM | POA: Diagnosis not present

## 2016-08-29 DIAGNOSIS — E559 Vitamin D deficiency, unspecified: Secondary | ICD-10-CM | POA: Diagnosis not present

## 2016-08-29 DIAGNOSIS — M351 Other overlap syndromes: Secondary | ICD-10-CM | POA: Diagnosis not present

## 2016-08-29 DIAGNOSIS — M1990 Unspecified osteoarthritis, unspecified site: Secondary | ICD-10-CM | POA: Diagnosis not present

## 2016-08-29 DIAGNOSIS — K219 Gastro-esophageal reflux disease without esophagitis: Secondary | ICD-10-CM | POA: Diagnosis not present

## 2016-08-29 DIAGNOSIS — E782 Mixed hyperlipidemia: Secondary | ICD-10-CM | POA: Diagnosis not present

## 2016-08-29 DIAGNOSIS — F3342 Major depressive disorder, recurrent, in full remission: Secondary | ICD-10-CM | POA: Diagnosis not present

## 2016-09-06 DIAGNOSIS — M797 Fibromyalgia: Secondary | ICD-10-CM | POA: Diagnosis not present

## 2016-09-06 DIAGNOSIS — M359 Systemic involvement of connective tissue, unspecified: Secondary | ICD-10-CM | POA: Diagnosis not present

## 2016-09-06 DIAGNOSIS — M549 Dorsalgia, unspecified: Secondary | ICD-10-CM | POA: Diagnosis not present

## 2016-09-06 DIAGNOSIS — M150 Primary generalized (osteo)arthritis: Secondary | ICD-10-CM | POA: Diagnosis not present

## 2016-09-14 ENCOUNTER — Encounter (HOSPITAL_COMMUNITY)
Admission: RE | Admit: 2016-09-14 | Discharge: 2016-09-14 | Disposition: A | Discharge status: Home / Self Care | Payer: Medicare Other | Source: Ambulatory Visit | Attending: Family Medicine | Admitting: Family Medicine

## 2016-09-14 DIAGNOSIS — R110 Nausea: Secondary | ICD-10-CM | POA: Diagnosis not present

## 2016-09-14 MED ORDER — TECHNETIUM TC 99M MEBROFENIN IV KIT
5.0000 | PACK | Freq: Once | INTRAVENOUS | Status: AC | PRN
  Administered 2016-09-14: 5 via INTRAVENOUS

## 2016-09-14 MED ORDER — SINCALIDE 5 MCG IJ SOLR: 1.4000 ug | Freq: Once | INTRAMUSCULAR | Status: DC

## 2016-09-20 DIAGNOSIS — Z96651 Presence of right artificial knee joint: Secondary | ICD-10-CM | POA: Diagnosis not present

## 2016-09-20 DIAGNOSIS — Z471 Aftercare following joint replacement surgery: Secondary | ICD-10-CM | POA: Diagnosis not present

## 2016-09-28 ENCOUNTER — Ambulatory Visit: Payer: Self-pay | Admitting: Surgery

## 2016-09-28 DIAGNOSIS — K828 Other specified diseases of gallbladder: Secondary | ICD-10-CM | POA: Diagnosis not present

## 2016-10-05 DIAGNOSIS — M549 Dorsalgia, unspecified: Secondary | ICD-10-CM | POA: Diagnosis not present

## 2016-10-05 DIAGNOSIS — M797 Fibromyalgia: Secondary | ICD-10-CM | POA: Diagnosis not present

## 2016-10-05 DIAGNOSIS — M150 Primary generalized (osteo)arthritis: Secondary | ICD-10-CM | POA: Diagnosis not present

## 2016-10-05 DIAGNOSIS — M359 Systemic involvement of connective tissue, unspecified: Secondary | ICD-10-CM | POA: Diagnosis not present

## 2016-11-20 ENCOUNTER — Encounter (HOSPITAL_COMMUNITY)
Admission: RE | Admit: 2016-11-20 | Discharge: 2016-11-20 | Disposition: A | Discharge status: Home / Self Care | Payer: Medicare Other | Source: Ambulatory Visit | Attending: Surgery | Admitting: Surgery

## 2016-11-20 ENCOUNTER — Encounter (HOSPITAL_COMMUNITY): Payer: Self-pay

## 2016-11-20 ENCOUNTER — Encounter (HOSPITAL_COMMUNITY): Payer: Self-pay | Admitting: Surgery

## 2016-11-20 DIAGNOSIS — F329 Major depressive disorder, single episode, unspecified: Secondary | ICD-10-CM | POA: Diagnosis not present

## 2016-11-20 DIAGNOSIS — M797 Fibromyalgia: Secondary | ICD-10-CM | POA: Diagnosis not present

## 2016-11-20 DIAGNOSIS — E7800 Pure hypercholesterolemia, unspecified: Secondary | ICD-10-CM | POA: Diagnosis not present

## 2016-11-20 DIAGNOSIS — K819 Cholecystitis, unspecified: Secondary | ICD-10-CM | POA: Diagnosis not present

## 2016-11-20 DIAGNOSIS — K219 Gastro-esophageal reflux disease without esophagitis: Secondary | ICD-10-CM | POA: Diagnosis not present

## 2016-11-20 DIAGNOSIS — Z01812 Encounter for preprocedural laboratory examination: Secondary | ICD-10-CM

## 2016-11-20 DIAGNOSIS — K828 Other specified diseases of gallbladder: Secondary | ICD-10-CM

## 2016-11-20 DIAGNOSIS — Z90711 Acquired absence of uterus with remaining cervical stump: Secondary | ICD-10-CM | POA: Diagnosis not present

## 2016-11-20 DIAGNOSIS — M1990 Unspecified osteoarthritis, unspecified site: Secondary | ICD-10-CM | POA: Diagnosis not present

## 2016-11-20 DIAGNOSIS — M62838 Other muscle spasm: Secondary | ICD-10-CM | POA: Diagnosis not present

## 2016-11-20 DIAGNOSIS — Z87891 Personal history of nicotine dependence: Secondary | ICD-10-CM | POA: Diagnosis not present

## 2016-11-20 LAB — CBC
HCT: 43.9 % (ref 36.0–46.0)
Hemoglobin: 14.4 g/dL (ref 12.0–15.0)
MCH: 29 pg (ref 26.0–34.0)
MCHC: 32.8 g/dL (ref 30.0–36.0)
MCV: 88.5 fL (ref 78.0–100.0)
Platelets: 231 10*3/uL (ref 150–400)
RBC: 4.96 MIL/uL (ref 3.87–5.11)
RDW: 14 % (ref 11.5–15.5)
WBC: 7.2 10*3/uL (ref 4.0–10.5)

## 2016-11-20 NOTE — H&P (Signed)
General Surgery [facility]  Jane Howe. Funari DOB: 02-11-1946 Married / Language: English / Race: White Female  History of Present Illness  The patient is a 70 year old female who presents for evaluation of gall stones.  Patient is referred by Dr. Cari Caraway for surgical management of biliary dyskinesia. Patient presents with a long-standing history of gastroesophageal reflux disease. She has been on medication for years. Over the past several years she has noted development of fatty food intolerance. She experiences "attacks" after fatty meals which involve epigastric abdominal pain, nausea, and sometimes radiation of pain to the back. These episodes may last for hours. Patient has been evaluated with abdominal ultrasound. This was a normal study. Patient underwent nuclear medicine hepatobiliary scan which did show a low gallbladder ejection fraction of 31%. Patient has a history of colonoscopy and upper endoscopy by Dr. Michail Sermon approximately 5 years ago. Patient now presents to discuss cholecystectomy for treatment of biliary dyskinesia. Patient has had no other upper abdominal surgery. There is no family history of gallbladder disease.   Other Problems  Arthritis Back Pain Depression Gastroesophageal Reflux Disease Hemorrhoids Hypercholesterolemia Other disease, cancer, significant illness  Past Surgical History Appendectomy Breast Biopsy Left. Hysterectomy (not due to cancer) - Partial Knee Surgery Left.  Diagnostic Studies History Colonoscopy 5-10 years ago Mammogram 1-3 years ago Pap Smear 1-5 years ago  Allergies No Known Drug Allergies10/13/2017  Medication History ALPRAZolam (0.25MG  Tablet, Oral) Active. TraMADol HCl (50MG  Tablet, Oral) Active. Alendronate Sodium (70MG  Tablet, Oral) Active. Cyclobenzaprine HCl (10MG  Tablet, Oral) Active. Meloxicam (15MG  Tablet, Oral) Active. Propranolol HCl (10MG  Tablet,  Oral) Active. Pravastatin Sodium (40MG  Tablet, Oral) Active. DULoxetine HCl (30MG  Capsule DR Part, Oral) Active. TraZODone HCl (50MG  Tablet, Oral) Active. Omeprazole (20MG  Capsule DR, Oral) Active. MiraLax (Oral) Active. Vitamin D (Cholecalciferol) (400UNIT Capsule, Oral) Active. Magnesium (250MG  Tablet, Oral) Active. Medications Reconciled  Social History Alcohol use Occasional alcohol use. Caffeine use Carbonated beverages, Coffee, Tea. No drug use Tobacco use Former smoker.  Family History Alcohol Abuse Mother, Sister. Anesthetic complications Mother. Arthritis Mother, Sister. Breast Cancer Mother, Sister. Cerebrovascular Accident Mother. Colon Polyps Mother. Depression Daughter, Mother, Sister. Heart disease in female family member before age 21 Hypertension Mother. Melanoma Daughter. Respiratory Condition Mother.  Pregnancy / Birth History Age at menarche 70 years. Age of menopause 51-55 Contraceptive History Oral contraceptives. Gravida 2 Maternal age 70-20 Para 2  Review of Systems General Present- Weight Loss. Not Present- Appetite Loss, Chills, Fatigue, Fever, Night Sweats and Weight Gain. Skin Not Present- Change in Wart/Mole, Dryness, Hives, Jaundice, New Lesions, Non-Healing Wounds, Rash and Ulcer. HEENT Present- Wears glasses/contact lenses. Not Present- Earache, Hearing Loss, Hoarseness, Nose Bleed, Oral Ulcers, Ringing in the Ears, Seasonal Allergies, Sinus Pain, Sore Throat, Visual Disturbances and Yellow Eyes. Respiratory Present- Snoring. Not Present- Bloody sputum, Chronic Cough, Difficulty Breathing and Wheezing. Breast Not Present- Breast Mass, Breast Pain, Nipple Discharge and Skin Changes. Cardiovascular Not Present- Chest Pain, Difficulty Breathing Lying Down, Leg Cramps, Palpitations, Rapid Heart Rate, Shortness of Breath and Swelling of Extremities. Gastrointestinal Present- Abdominal Pain, Bloating, Constipation,  Hemorrhoids, Indigestion and Nausea. Not Present- Bloody Stool, Change in Bowel Habits, Chronic diarrhea, Difficulty Swallowing, Excessive gas, Gets full quickly at meals, Rectal Pain and Vomiting. Female Genitourinary Present- Frequency. Not Present- Nocturia, Painful Urination, Pelvic Pain and Urgency. Musculoskeletal Present- Back Pain, Joint Pain, Joint Stiffness and Swelling of Extremities. Not Present- Muscle Pain and Muscle Weakness. Neurological Present- Tremor. Not Present- Decreased Memory, Fainting,  Headaches, Numbness, Seizures, Tingling, Trouble walking and Weakness. Psychiatric Not Present- Anxiety, Bipolar, Change in Sleep Pattern, Depression, Fearful and Frequent crying. Endocrine Not Present- Cold Intolerance, Excessive Hunger, Hair Changes, Heat Intolerance, Hot flashes and New Diabetes. Hematology Not Present- Blood Thinners, Easy Bruising, Excessive bleeding, Gland problems, HIV and Persistent Infections.  Vitals Weight: 157 lb Height: 61in Body Surface Area: 1.7 m Body Mass Index: 29.66 kg/m  Temp.: 97.42F(Temporal)  Pulse: 76 (Regular)  BP: 126/80 (Sitting, Left Arm, Standard)   Physical Exam The physical exam findings are as follows: Note:General - appears comfortable, no distress; not diaphorectic  HEENT - normocephalic; sclerae clear, gaze conjugate; mucous membranes moist, dentition good; voice normal  Neck - symmetric on extension; no palpable anterior or posterior cervical adenopathy; no palpable masses in the thyroid bed  Chest - clear bilaterally without rhonchi, rales, or wheeze  Cor - regular rhythm with normal rate; no significant murmur  Abd - soft without distension; no hepatosplenomegaly; no tenderness in the epigastrium  Ext - non-tender without significant edema or lymphedema  Neuro - grossly intact; no tremor    Assessment & Plan  BILIARY 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 We reviewed her diagnostic studies including ultrasound and nuclear medicine hepatobiliary scan. Patient is provided with written literature on gallbladder surgery to review at home.  Patient does have biliary dyskinesia. I believe she is symptomatic. We discussed laparoscopic cholecystectomy with intraoperative cholangiography as treatment. I explained to her that there was no evidence that she had gallstones. The rate of symptomatic improvement with cholecystectomy is approximately 60-65%. We discussed the procedure, the hospital stay, in the postoperative recovery. Patient understands and wishes to proceed with surgery in the near future.  If symptoms persist following surgery, we will consider referral to gastroenterology for further evaluation and recommendations. Hopefully her symptoms will be relieved by cholecystectomy.  The risks and benefits of the procedure have been discussed at length with the patient. The patient understands the proposed procedure, potential alternative treatments, and the course of recovery to be expected. All of the patient's questions have been answered at this time. The patient wishes to proceed with surgery.  Earnstine Regal, MD, [facility] Office: [phone]

## 2016-11-20 NOTE — Patient Instructions (Signed)
Jane Howe  11/20/2016   Your procedure is scheduled on: 11-22-16  Report to [facility] Main  Entrance take [facility]  elevators to 3rd floor to  [place] at  0800  AM.  Call this number if you have problems the morning of surgery [phone]   Remember: ONLY 1 PERSON MAY GO WITH YOU TO SHORT STAY TO GET  READY MORNING OF [place].  Do not eat food or drink liquids :After Midnight.     Take these medicines the morning of surgery with A SIP OF WATER: Cymbalta. Omeprazole. Propranolol. Tramadol-if need. DO NOT TAKE ANY DIABETIC MEDICATIONS DAY OF YOUR SURGERY                               You may not have any metal on your body including hair pins and              piercings  Do not wear jewelry, make-up, lotions, powders or perfumes, deodorant             Do not wear nail polish.  Do not shave  48 hours prior to surgery.              Men may shave face and neck.   Do not bring valuables to the hospital. [place].  Contacts, dentures or bridgework may not be worn into surgery.  Leave suitcase in the car. After surgery it may be brought to your room.     Patients discharged the day of surgery will not be allowed to drive home.  Name and phone number of your driver: don-spouse [phone] cell  Special Instructions: N/A              Please read over the following fact sheets you were given: _____________________________________________________________________             [facility] - Preparing for Surgery Before surgery, you can play an important role.  Because skin is not sterile, your skin needs to be as free of germs as possible.  You can reduce the number of germs on your skin by washing with CHG (chlorahexidine gluconate) soap before surgery.  CHG is an antiseptic cleaner which kills germs and bonds with the skin to continue killing germs even after washing. Please DO NOT use if you have  an allergy to CHG or antibacterial soaps.  If your skin becomes reddened/irritated stop using the CHG and inform your nurse when you arrive at Short Stay. Do not shave (including legs and underarms) for at least 48 hours prior to the first CHG shower.  You may shave your face/neck. Please follow these instructions carefully:  1.  Shower with CHG Soap the night before surgery and the  morning of Surgery.  2.  If you choose to wash your hair, wash your hair first as usual with your  normal  shampoo.  3.  After you shampoo, rinse your hair and body thoroughly to remove the  shampoo.                           4.  Use CHG as you would any other liquid soap.  You can apply chg  directly  to the skin and wash                       Gently with a scrungie or clean washcloth.  5.  Apply the CHG Soap to your body ONLY FROM THE NECK DOWN.   Do not use on face/ open                           Wound or open sores. Avoid contact with eyes, ears mouth and genitals (private parts).                       Wash face,  Genitals (private parts) with your normal soap.             6.  Wash thoroughly, paying special attention to the area where your surgery  will be performed.  7.  Thoroughly rinse your body with warm water from the neck down.  8.  DO NOT shower/wash with your normal soap after using and rinsing off  the CHG Soap.                9.  Pat yourself dry with a clean towel.            10.  Wear clean pajamas.            11.  Place clean sheets on your bed the night of your first shower and do not  sleep with pets. Day of Surgery : Do not apply any lotions/deodorants the morning of surgery.  Please wear clean clothes to the hospital/surgery center.  FAILURE TO FOLLOW THESE INSTRUCTIONS MAY RESULT IN THE CANCELLATION OF YOUR SURGERY PATIENT SIGNATURE_________________________________  NURSE SIGNATURE__________________________________  ________________________________________________________________________

## 2016-11-22 ENCOUNTER — Encounter (HOSPITAL_COMMUNITY)
Admission: RE | Disposition: A | Discharge status: Home / Self Care | Payer: Self-pay | Source: Ambulatory Visit | Attending: Surgery

## 2016-11-22 ENCOUNTER — Ambulatory Visit (HOSPITAL_COMMUNITY): Payer: Medicare Other | Admitting: Certified Registered"

## 2016-11-22 ENCOUNTER — Encounter (HOSPITAL_COMMUNITY): Payer: Self-pay | Admitting: *Deleted

## 2016-11-22 ENCOUNTER — Ambulatory Visit (HOSPITAL_COMMUNITY): Payer: Medicare Other

## 2016-11-22 ENCOUNTER — Observation Stay (HOSPITAL_COMMUNITY)
Admission: RE | Admit: 2016-11-22 | Discharge: 2016-11-23 | Disposition: A | Discharge status: Home / Self Care | Payer: Medicare Other | Source: Ambulatory Visit | Attending: Surgery | Admitting: Surgery

## 2016-11-22 DIAGNOSIS — Z90711 Acquired absence of uterus with remaining cervical stump: Secondary | ICD-10-CM | POA: Diagnosis not present

## 2016-11-22 DIAGNOSIS — M1990 Unspecified osteoarthritis, unspecified site: Secondary | ICD-10-CM | POA: Diagnosis not present

## 2016-11-22 DIAGNOSIS — Z87891 Personal history of nicotine dependence: Secondary | ICD-10-CM | POA: Diagnosis not present

## 2016-11-22 DIAGNOSIS — M62838 Other muscle spasm: Secondary | ICD-10-CM | POA: Insufficient documentation

## 2016-11-22 DIAGNOSIS — K828 Other specified diseases of gallbladder: Secondary | ICD-10-CM | POA: Diagnosis not present

## 2016-11-22 DIAGNOSIS — K819 Cholecystitis, unspecified: Secondary | ICD-10-CM | POA: Diagnosis not present

## 2016-11-22 DIAGNOSIS — M797 Fibromyalgia: Secondary | ICD-10-CM | POA: Diagnosis not present

## 2016-11-22 DIAGNOSIS — K811 Chronic cholecystitis: Secondary | ICD-10-CM | POA: Diagnosis not present

## 2016-11-22 DIAGNOSIS — K219 Gastro-esophageal reflux disease without esophagitis: Secondary | ICD-10-CM | POA: Insufficient documentation

## 2016-11-22 DIAGNOSIS — F329 Major depressive disorder, single episode, unspecified: Secondary | ICD-10-CM | POA: Insufficient documentation

## 2016-11-22 DIAGNOSIS — M179 Osteoarthritis of knee, unspecified: Secondary | ICD-10-CM | POA: Diagnosis not present

## 2016-11-22 DIAGNOSIS — E7800 Pure hypercholesterolemia, unspecified: Secondary | ICD-10-CM | POA: Diagnosis not present

## 2016-11-22 DIAGNOSIS — Z9049 Acquired absence of other specified parts of digestive tract: Secondary | ICD-10-CM | POA: Diagnosis not present

## 2016-11-22 HISTORY — PX: CHOLECYSTECTOMY: SHX55

## 2016-11-22 SURGERY — LAPAROSCOPIC CHOLECYSTECTOMY WITH INTRAOPERATIVE CHOLANGIOGRAM
Anesthesia: General

## 2016-11-22 MED ORDER — SUCCINYLCHOLINE CHLORIDE 200 MG/10ML IV SOSY
PREFILLED_SYRINGE | INTRAVENOUS | Status: DC | PRN
  Administered 2016-11-22: 100 mg via INTRAVENOUS

## 2016-11-22 MED ORDER — SUGAMMADEX SODIUM 200 MG/2ML IV SOLN
INTRAVENOUS | Status: DC | PRN
  Administered 2016-11-22: 200 mg via INTRAVENOUS

## 2016-11-22 MED ORDER — KCL IN DEXTROSE-NACL 20-5-0.45 MEQ/L-%-% IV SOLN
INTRAVENOUS | Status: DC
  Administered 2016-11-22: 14:00:00 via INTRAVENOUS
  Filled 2016-11-22 (×2): qty 1000

## 2016-11-22 MED ORDER — MIDAZOLAM HCL 2 MG/2ML IJ SOLN
INTRAMUSCULAR | Status: DC | PRN
  Administered 2016-11-22: 2 mg via INTRAVENOUS

## 2016-11-22 MED ORDER — BUPIVACAINE-EPINEPHRINE 0.5% -1:200000 IJ SOLN
INTRAMUSCULAR | Status: DC | PRN
  Administered 2016-11-22: 20 mL

## 2016-11-22 MED ORDER — FENTANYL CITRATE (PF) 250 MCG/5ML IJ SOLN
INTRAMUSCULAR | Status: DC | PRN
  Administered 2016-11-22: 50 ug via INTRAVENOUS
  Administered 2016-11-22: 100 ug via INTRAVENOUS
  Administered 2016-11-22 (×2): 50 ug via INTRAVENOUS

## 2016-11-22 MED ORDER — LIDOCAINE 2% (20 MG/ML) 5 ML SYRINGE
INTRAMUSCULAR | Status: DC | PRN
  Administered 2016-11-22: 160 mg via INTRAVENOUS

## 2016-11-22 MED ORDER — HYDROCODONE-ACETAMINOPHEN 5-325 MG PO TABS
1.0000 | ORAL_TABLET | ORAL | Status: DC | PRN
  Administered 2016-11-22: 2 via ORAL
  Administered 2016-11-22 (×2): 1 via ORAL
  Administered 2016-11-23: 2 via ORAL
  Filled 2016-11-22 (×2): qty 2
  Filled 2016-11-22 (×2): qty 1

## 2016-11-22 MED ORDER — ONDANSETRON HCL 4 MG/2ML IJ SOLN
INTRAMUSCULAR | Status: DC | PRN
  Administered 2016-11-22: 4 mg via INTRAVENOUS

## 2016-11-22 MED ORDER — EPHEDRINE SULFATE-NACL 50-0.9 MG/10ML-% IV SOSY
PREFILLED_SYRINGE | INTRAVENOUS | Status: DC | PRN
  Administered 2016-11-22: 10 mg via INTRAVENOUS

## 2016-11-22 MED ORDER — ROCURONIUM BROMIDE 10 MG/ML (PF) SYRINGE
PREFILLED_SYRINGE | INTRAVENOUS | Status: DC | PRN
  Administered 2016-11-22: 25 mg via INTRAVENOUS

## 2016-11-22 MED ORDER — PROMETHAZINE HCL 25 MG/ML IJ SOLN
6.2500 mg | INTRAMUSCULAR | Status: DC | PRN
Start: 2016-11-22 — End: 2016-11-22

## 2016-11-22 MED ORDER — ALPRAZOLAM 0.25 MG PO TABS
0.2500 mg | ORAL_TABLET | Freq: Every evening | ORAL | Status: DC | PRN
  Administered 2016-11-22: 0.25 mg via ORAL
  Filled 2016-11-22: qty 1

## 2016-11-22 MED ORDER — HYDROCODONE-ACETAMINOPHEN 5-325 MG PO TABS: 1.0000 | ORAL_TABLET | ORAL | 0 refills | Status: DC | PRN

## 2016-11-22 MED ORDER — IOPAMIDOL (ISOVUE-300) INJECTION 61%
INTRAVENOUS | Status: AC
  Filled 2016-11-22: qty 50

## 2016-11-22 MED ORDER — MIDAZOLAM HCL 2 MG/2ML IJ SOLN
INTRAMUSCULAR | Status: AC
  Filled 2016-11-22: qty 2

## 2016-11-22 MED ORDER — ACETAMINOPHEN 650 MG RE SUPP: 650.0000 mg | Freq: Four times a day (QID) | RECTAL | Status: DC | PRN

## 2016-11-22 MED ORDER — HYDROMORPHONE HCL 2 MG/ML IJ SOLN
1.0000 mg | INTRAMUSCULAR | Status: DC | PRN
  Filled 2016-11-22: qty 1

## 2016-11-22 MED ORDER — GLYCOPYRROLATE 0.2 MG/ML IV SOSY
PREFILLED_SYRINGE | INTRAVENOUS | Status: DC | PRN
  Administered 2016-11-22: .2 mg via INTRAVENOUS

## 2016-11-22 MED ORDER — CEFAZOLIN SODIUM-DEXTROSE 2-4 GM/100ML-% IV SOLN
2.0000 g | INTRAVENOUS | Status: AC
  Administered 2016-11-22: 2 g via INTRAVENOUS
  Filled 2016-11-22: qty 100

## 2016-11-22 MED ORDER — FENTANYL CITRATE (PF) 250 MCG/5ML IJ SOLN
INTRAMUSCULAR | Status: AC
  Filled 2016-11-22: qty 5

## 2016-11-22 MED ORDER — HYDROMORPHONE HCL 1 MG/ML IJ SOLN: 0.2500 mg | INTRAMUSCULAR | Status: DC | PRN

## 2016-11-22 MED ORDER — BUPIVACAINE HCL (PF) 0.5 % IJ SOLN
INTRAMUSCULAR | Status: AC
  Filled 2016-11-22: qty 30

## 2016-11-22 MED ORDER — PROPOFOL 10 MG/ML IV BOLUS
INTRAVENOUS | Status: DC | PRN
  Administered 2016-11-22: 130 mg via INTRAVENOUS

## 2016-11-22 MED ORDER — ONDANSETRON 4 MG PO TBDP: 4.0000 mg | ORAL_TABLET | Freq: Four times a day (QID) | ORAL | Status: DC | PRN

## 2016-11-22 MED ORDER — SODIUM CHLORIDE 0.9 % IV SOLN
INTRAVENOUS | Status: DC | PRN
  Administered 2016-11-22: 11 mL

## 2016-11-22 MED ORDER — CEFAZOLIN SODIUM-DEXTROSE 2-4 GM/100ML-% IV SOLN
INTRAVENOUS | Status: AC
  Filled 2016-11-22: qty 100

## 2016-11-22 MED ORDER — PROPOFOL 10 MG/ML IV BOLUS
INTRAVENOUS | Status: AC
Start: 2016-11-22 — End: 2016-11-22
  Filled 2016-11-22: qty 20

## 2016-11-22 MED ORDER — LACTATED RINGERS IV SOLN
INTRAVENOUS | Status: DC
  Administered 2016-11-22 (×3): via INTRAVENOUS

## 2016-11-22 MED ORDER — ACETAMINOPHEN 325 MG PO TABS: 650.0000 mg | ORAL_TABLET | Freq: Four times a day (QID) | ORAL | Status: DC | PRN

## 2016-11-22 MED ORDER — ONDANSETRON HCL 4 MG/2ML IJ SOLN: 4.0000 mg | Freq: Four times a day (QID) | INTRAMUSCULAR | Status: DC | PRN

## 2016-11-22 SURGICAL SUPPLY — 34 items
APPLIER CLIP ROT 10 11.4 M/L (STAPLE) ×2
APR CLP MED LRG 11.4X10 (STAPLE) ×1
BAG SPEC RTRVL LRG 6X4 10 (ENDOMECHANICALS) ×1
CABLE HIGH FREQUENCY MONO STRZ (ELECTRODE) ×2 IMPLANT
CHLORAPREP W/TINT 26ML (MISCELLANEOUS) ×3 IMPLANT
CLIP APPLIE ROT 10 11.4 M/L (STAPLE) ×1 IMPLANT
COVER MAYO STAND STRL (DRAPES) ×2 IMPLANT
COVER SURGICAL LIGHT HANDLE (MISCELLANEOUS) ×2 IMPLANT
DECANTER SPIKE VIAL GLASS SM (MISCELLANEOUS) ×2 IMPLANT
DRAPE C-ARM 42X120 X-RAY (DRAPES) ×2 IMPLANT
ELECT REM PT RETURN 9FT ADLT (ELECTROSURGICAL) ×2
ELECTRODE REM PT RTRN 9FT ADLT (ELECTROSURGICAL) ×1 IMPLANT
GAUZE SPONGE 2X2 8PLY STRL LF (GAUZE/BANDAGES/DRESSINGS) ×1 IMPLANT
GLOVE SURG ORTHO 8.0 STRL STRW (GLOVE) ×2 IMPLANT
GOWN STRL REUS W/TWL XL LVL3 (GOWN DISPOSABLE) ×6 IMPLANT
HEMOSTAT SURGICEL 4X8 (HEMOSTASIS) IMPLANT
IRRIG SUCT STRYKERFLOW 2 WTIP (MISCELLANEOUS) ×2
IRRIGATION SUCT STRKRFLW 2 WTP (MISCELLANEOUS) ×1 IMPLANT
KIT BASIN OR (CUSTOM PROCEDURE TRAY) ×2 IMPLANT
POUCH SPECIMEN RETRIEVAL 10MM (ENDOMECHANICALS) ×2 IMPLANT
SCISSORS LAP 5X35 DISP (ENDOMECHANICALS) ×2 IMPLANT
SET CHOLANGIOGRAPH MIX (MISCELLANEOUS) ×2 IMPLANT
SET IRRIG TUBING LAPAROSCOPIC (IRRIGATION / IRRIGATOR) ×1 IMPLANT
SLEEVE XCEL OPT CAN 5 100 (ENDOMECHANICALS) ×2 IMPLANT
SPONGE GAUZE 2X2 STER 10/PKG (GAUZE/BANDAGES/DRESSINGS) ×1
STRIP CLOSURE SKIN 1/2X4 (GAUZE/BANDAGES/DRESSINGS) ×2 IMPLANT
SUT MNCRL AB 4-0 PS2 18 (SUTURE) ×2 IMPLANT
TOWEL OR 17X26 10 PK STRL BLUE (TOWEL DISPOSABLE) ×2 IMPLANT
TOWEL OR NON WOVEN STRL DISP B (DISPOSABLE) ×2 IMPLANT
TRAY LAPAROSCOPIC (CUSTOM PROCEDURE TRAY) ×2 IMPLANT
TROCAR BLADELESS OPT 5 100 (ENDOMECHANICALS) ×2 IMPLANT
TROCAR XCEL BLUNT TIP 100MML (ENDOMECHANICALS) ×2 IMPLANT
TROCAR XCEL NON-BLD 11X100MML (ENDOMECHANICALS) ×2 IMPLANT
TUBING INSUF HEATED (TUBING) ×1 IMPLANT

## 2016-11-22 NOTE — Anesthesia Procedure Notes (Signed)
Procedure Name: Intubation Date/Time: 11/22/2016 10:22 AM Performed by: Cynda Familia Pre-anesthesia Checklist: Patient identified, Emergency Drugs available, Suction available and Patient being monitored Patient Re-evaluated:Patient Re-evaluated prior to inductionOxygen Delivery Method: Circle System Utilized Preoxygenation: Pre-oxygenation with 100% oxygen Intubation Type: IV induction, Rapid sequence and Cricoid Pressure applied Ventilation: Mask ventilation without difficulty Laryngoscope Size: Miller and 2 Grade View: Grade I Tube type: Oral Number of attempts: 1 Airway Equipment and Method: Stylet Placement Confirmation: ETT inserted through vocal cords under direct vision,  positive ETCO2 and breath sounds checked- equal and bilateral Secured at: 22 cm Tube secured with: Tape Dental Injury: Teeth and Oropharynx as per pre-operative assessment  Comments: Smooth IV induction  Massagee intubation AM CRNA--- atraumatic--- teeth and mouth as perop--- bilat BS Massagee

## 2016-11-22 NOTE — Anesthesia Preprocedure Evaluation (Addendum)
Anesthesia Evaluation  Patient identified by MRN, date of birth, ID band Patient awake    Reviewed: Allergy & Precautions, NPO status , Patient's Chart, lab work & pertinent test results  History of Anesthesia Complications Negative for: history of anesthetic complications  Airway Mallampati: I  TM Distance: >3 FB Neck ROM: Full    Dental  (+) Teeth Intact, Dental Advisory Given, Caps,    Pulmonary neg pulmonary ROS, former smoker,    breath sounds clear to auscultation       Cardiovascular negative cardio ROS   Rhythm:Regular Rate:Normal     Neuro/Psych negative neurological ROS     GI/Hepatic Neg liver ROS, GERD  ,  Endo/Other  negative endocrine ROS  Renal/GU negative Renal ROS     Musculoskeletal  (+) Arthritis , Fibromyalgia -  Abdominal   Peds  Hematology negative hematology ROS (+)   Anesthesia Other Findings   Reproductive/Obstetrics                            Anesthesia Physical Anesthesia Plan  ASA: II  Anesthesia Plan: General   Post-op Pain Management:    Induction: Intravenous  Airway Management Planned: Oral ETT  Additional Equipment:   Intra-op Plan:   Post-operative Plan: Extubation in OR  Informed Consent: I have reviewed the patients History and Physical, chart, labs and discussed the procedure including the risks, benefits and alternatives for the proposed anesthesia with the patient or authorized representative who has indicated his/her understanding and acceptance.   Dental advisory given  Plan Discussed with: CRNA  Anesthesia Plan Comments:        Anesthesia Quick Evaluation

## 2016-11-22 NOTE — Interval H&P Note (Signed)
History and Physical Interval Note:  11/22/2016 10:07 AM  Jane Howe  has presented today for surgery, with the diagnosis of Biliary dyskinesia.  The various methods of treatment have been discussed with the patient and family. After consideration of risks, benefits and other options for treatment, the patient has consented to    Procedure(s): LAPAROSCOPIC CHOLECYSTECTOMY WITH INTRAOPERATIVE CHOLANGIOGRAM (N/A) as a surgical intervention .    The patient's history has been reviewed, patient examined, no change in status, stable for surgery.  I have reviewed the patient's chart and labs.  Questions were answered to the patient's satisfaction.    Earnstine Regal, MD, [facility] Office: [place]

## 2016-11-22 NOTE — Transfer of Care (Signed)
Immediate Anesthesia Transfer of Care Note  Patient: Jane Howe  Procedure(s) Performed: Procedure(s): LAPAROSCOPIC CHOLECYSTECTOMY WITH INTRAOPERATIVE CHOLANGIOGRAM (N/A)  Patient Location: PACU  Anesthesia Type:General  Level of Consciousness: awake and alert   Airway & Oxygen Therapy: Patient Spontanous Breathing and Patient connected to face mask oxygen  Post-op Assessment: Report given to RN and Post -op Vital signs reviewed and stable  Post vital signs: Reviewed and stable  Last Vitals:  Vitals:   11/22/16 0829  BP: (!) 116/91  Pulse: 70  Resp: 18  Temp: 36.7 C    Last Pain:  Vitals:   11/22/16 0830  TempSrc:   PainSc: 2       Patients Stated Pain Goal: 3 ([id number])  Complications: No apparent anesthesia complications

## 2016-11-22 NOTE — Anesthesia Postprocedure Evaluation (Signed)
Anesthesia Post Note  Patient: Jane Howe  Procedure(s) Performed: Procedure(s) (LRB): LAPAROSCOPIC CHOLECYSTECTOMY WITH INTRAOPERATIVE CHOLANGIOGRAM (N/A)  Patient location during evaluation: PACU Anesthesia Type: General Level of consciousness: awake and alert Pain management: pain level controlled Vital Signs Assessment: post-procedure vital signs reviewed and stable Respiratory status: spontaneous breathing, nonlabored ventilation, respiratory function stable and patient connected to nasal cannula oxygen Cardiovascular status: blood pressure returned to baseline and stable Postop Assessment: no signs of nausea or vomiting Anesthetic complications: no    Last Vitals:  Vitals:   11/22/16 1519 11/22/16 1558  BP: (!) 95/41 (!) 97/48  Pulse: 69 69  Resp: 14 14  Temp: 36.7 C 36.8 C    Last Pain:  Vitals:   11/22/16 1558  TempSrc: Oral  PainSc:                  Wayne Brunker,JAMES TERRILL

## 2016-11-22 NOTE — Op Note (Signed)
Procedure Note  Pre-operative Diagnosis:  Biliary dyskinesia  Post-operative Diagnosis:  same  Surgeon:  Earnstine Regal, MD, FACS  Assistant:  Coralie Keens, MD, FACS   Procedure:  Laparoscopic cholecystectomy with intra-operative cholangiography  Anesthesia:  General  Estimated Blood Loss:  minimal  Drains: none         Specimen: Gallbladder to pathology  Indications:  Patient is referred by Dr. Cari Caraway for surgical management of biliary dyskinesia. Patient presents with a long-standing history of gastroesophageal reflux disease. She has been on medication for years. Over the past several years she has noted development of fatty food intolerance. She experiences "attacks" after fatty meals which involve epigastric abdominal pain, nausea, and sometimes radiation of pain to the back. These episodes may last for hours. Patient has been evaluated with abdominal ultrasound. This was a normal study. Patient underwent nuclear medicine hepatobiliary scan which did show a low gallbladder ejection fraction of 31%. Patient has a history of colonoscopy and upper endoscopy by Dr. Michail Sermon approximately 5 years ago. Patient now presents to discuss cholecystectomy for treatment of biliary dyskinesia.   Procedure Details:  The patient was seen in the pre-op holding area. The risks, benefits, complications, treatment options, and expected outcomes were previously discussed with the patient. The patient agreed with the proposed plan and has signed the informed consent form.  The patient was brought to the Operating Room, identified as Jane Howe and the procedure verified as laparoscopic cholecystectomy with intraoperative cholangiography. A "time out" was completed and the above information confirmed.  The patient was placed in the supine position. Following induction of general anesthesia, the abdomen was prepped and draped in the usual aseptic fashion.  An incision was made in  the skin near the umbilicus. The midline fascia was incised and the peritoneal cavity was entered and a Hasson canula was introduced under direct vision.  The Hasson canula was secured with a 0-Vicryl pursestring suture. Pneumoperitoneum was established with carbon dioxide. Additional trocars were introduced under direct vision along the right costal margin in the midline, mid-clavicular line, and anterior axillary line.   The gallbladder was identified and the fundus grasped and retracted cephalad. Moderate adhesions were taken down bluntly and the electrocautery was utilized as needed, taking care not to injure any adjacent structures. The infundibulum was grasped and retracted laterally, exposing the peritoneum overlying the triangle of Calot. The peritoneum was incised and structures exposed with blunt dissection. The cystic duct was clearly identified, bluntly dissected circumferentially, and clipped at the neck of the gallbladder.  An incision was made in the cystic duct and the cholangiogram catheter introduced. The catheter was secured using an ligaclip.  Real-time cholangiography was performed using C-arm fluoroscopy.  There was rapid filling of a normal caliber common bile duct.  There was reflux of contrast into the left and right hepatic ductal systems.  There was free flow distally into the duodenum without filling defect or obstruction.  The catheter was removed from the peritoneal cavity.  The cystic duct was then ligated with surgical clips and divided. The cystic artery was identified, dissected circumferentially, ligated with ligaclips, and divided.  The gallbladder was dissected away from the liver bed using the electrocautery for hemostasis. The gallbladder was completely removed from the liver and placed into an endocatch bag. The gallbladder was removed in the endocatch bag through the umbilical port site and submitted to pathology for review.  The right upper quadrant was irrigated  and the gallbladder bed was  inspected. Hemostasis was achieved with the electrocautery.  Pneumoperitoneum was released after viewing removal of the trocars with good hemostasis noted. The umbilical wound was irrigated and the fascia was then closed with the pursestring suture.  Local anesthetic was infiltrated at all port sites. The skin incisions were closed with 4-0 Monocril subcuticular sutures and steri-strips and dressings were applied.  Instrument, sponge, and needle counts were correct at the conclusion of the case.  The patient was awakened from anesthesia and brought to the recovery room in stable condition.  The patient tolerated the procedure well.   Earnstine Regal, MD, [facility] Office: [phone]

## 2016-11-23 DIAGNOSIS — K219 Gastro-esophageal reflux disease without esophagitis: Secondary | ICD-10-CM | POA: Diagnosis not present

## 2016-11-23 DIAGNOSIS — K828 Other specified diseases of gallbladder: Secondary | ICD-10-CM | POA: Diagnosis not present

## 2016-11-23 DIAGNOSIS — M62838 Other muscle spasm: Secondary | ICD-10-CM | POA: Diagnosis not present

## 2016-11-23 DIAGNOSIS — E7800 Pure hypercholesterolemia, unspecified: Secondary | ICD-10-CM | POA: Diagnosis not present

## 2016-11-23 DIAGNOSIS — K819 Cholecystitis, unspecified: Secondary | ICD-10-CM | POA: Diagnosis not present

## 2016-11-23 DIAGNOSIS — F329 Major depressive disorder, single episode, unspecified: Secondary | ICD-10-CM | POA: Diagnosis not present

## 2016-11-23 MED ORDER — HYDROCODONE-ACETAMINOPHEN 5-325 MG PO TABS: 1.0000 | ORAL_TABLET | ORAL | 0 refills | Status: DC | PRN

## 2016-11-23 NOTE — Discharge Summary (Signed)
Physician Discharge Summary [facility]  Patient ID: Jane Howe MRN: [id number] DOB/AGE: [birth year] 68 y.o.  Admit date: 11/22/2016 Discharge date: 11/23/2016  Admission Diagnoses:  Biliary dyskinesia   Discharge Diagnoses:  Principal Problem:   Biliary dyskinesia   Discharged Condition: good  Hospital Course: Patient was admitted for observation following gallbladder surgery.  Post op course was uncomplicated.  Pain was well controlled.  Tolerated diet.  Patient was prepared for discharge home on POD#1.  Consults: None  Treatments: surgery: lap chole with IOC  Discharge Exam: Blood pressure (!) 132/51, pulse 79, temperature 98.1 F (36.7 C), temperature source Oral, resp. rate 15, height 5\' 1"  (1.549 m), weight 73.9 kg (163 lb), SpO2 95 %. HEENT - clear Neck - soft Chest - clear bilaterally Cor - RRR Abd - soft without distension; dressings dry and intact; mild tenderness diffusely  Disposition: Home  Discharge Instructions    Diet - low sodium heart healthy    Complete by:  As directed    Discharge instructions    Complete by:  As directed    [facility]  LAPAROSCOPIC SURGERY:  POST-OP INSTRUCTIONS  Always review your discharge instruction sheet given to you by the facility where your surgery was performed.  A prescription for pain medication may be given to you upon discharge.  Take your pain medication as prescribed.  If narcotic pain medicine is not needed, then you may take acetaminophen (Tylenol) or ibuprofen (Advil) as needed.  Take your usually prescribed medications unless otherwise directed.  If you need a refill on your pain medication, please contact your pharmacy.  They will contact our office to request authorization. Prescriptions will not be filled after 5 P.M. or on weekends.  You should follow a light diet the first few days after arrival home, such as soup and crackers or toast.  Be sure to include  plenty of fluids daily.  Most patients will experience some swelling and bruising in the area of the incisions.  Ice packs will help.  Swelling and bruising can take several days to resolve.   It is common to experience some constipation after surgery.  Increasing fluid intake and taking a stool softener (such as Colace) will usually help or prevent this problem from occurring.  A mild laxative (Milk of Magnesia or Miralax) should be taken according to package instructions if there has been no bowel movement after 48 hours.  You will have steri-strips and a gauze dressing over your incisions.  You may remove the gauze bandage on the second day after surgery, and you may shower at that time.  Leave your steri-strips (small skin tapes) in place directly over the incision.  These strips should remain on the skin for 5-7 days and then be removed.  You may get them wet in the shower and pat them dry.  Any sutures or staples will be removed at the office during your follow-up visit.  ACTIVITIES:  You may resume regular (light) daily activities beginning the next day - such as daily self-care, walking, climbing stairs - gradually increasing activities as tolerated.  You may have sexual intercourse when it is comfortable.  Refrain from any heavy lifting or straining until approved by your doctor.  You may drive when you are no longer taking prescription pain medication, you can comfortably wear a seatbelt, and you can safely maneuver your car and apply brakes.  You should see your doctor in the office for a follow-up appointment  approximately 2-3 weeks after your surgery.  Make sure that you call for this appointment within a day or two after you arrive home to insure a convenient appointment time.  WHEN TO CALL YOUR DOCTOR: Fever over 101.0 Inability to urinate Continued bleeding from incision Increased pain, redness, or drainage from the incision Increasing abdominal pain  The clinic staff is  available to answer your questions during regular business hours.  Please don't hesitate to call and ask to speak to one of the nurses for clinical concerns.  If you have a medical emergency, go to the nearest emergency room or call 911.  A surgeon from [facility] is always on call for the hospital.  Earnstine Regal, MD, [facility] Office: [place]:  [place] [phone]  Website: [email or web address]   Increase activity slowly    Complete by:  As directed    Remove dressing in 24 hours    Complete by:  As directed        Medication List    TAKE these medications   alendronate 70 MG tablet Commonly known as:  FOSAMAX Take 70 mg by mouth every Sunday. Take with a full glass of water on an empty stomach.   ALPRAZolam 0.25 MG tablet Commonly known as:  XANAX Take 0.25 mg by mouth at bedtime as needed for sleep.   b complex vitamins tablet Take 1 tablet by mouth daily.   cyclobenzaprine 10 MG tablet Commonly known as:  FLEXERIL Take 1 tablet (10 mg total) by mouth 3 (three) times daily as needed for muscle spasms. What changed:  how much to take  when to take this   DULoxetine 30 MG capsule Commonly known as:  CYMBALTA Take 90 mg by mouth daily.   HYDROcodone-acetaminophen 5-325 MG tablet Commonly known as:  NORCO/VICODIN Take 1-2 tablets by mouth every 4 (four) hours as needed for moderate pain.   HYDROcodone-acetaminophen 5-325 MG tablet Commonly known as:  NORCO/VICODIN Take 1-2 tablets by mouth every 4 (four) hours as needed for moderate pain.   magnesium oxide 400 MG tablet Commonly known as:  MAG-OX Take 400 mg by mouth at bedtime.   meloxicam 15 MG tablet Commonly known as:  MOBIC Take 15 mg by mouth daily.   omeprazole 20 MG capsule Commonly known as:  PRILOSEC Take 20 mg by mouth daily.   polyethylene glycol packet Commonly known as:  MIRALAX / GLYCOLAX Take 17 g by mouth  daily.   pravastatin 40 MG tablet Commonly known as:  PRAVACHOL Take 40 mg by mouth every other day. Every other day in the evening.   PROBIOTIC DAILY PO Take 1 capsule by mouth daily.   propranolol 10 MG tablet Commonly known as:  INDERAL Take 10 mg by mouth daily as needed (non-essential tremors).   traMADol 50 MG tablet Commonly known as:  ULTRAM Take 1 tablet (50 mg total) by mouth every 6 (six) hours as needed (mild pain).   Vitamin D3 2000 units Tabs Take 4,000 Units by mouth daily.      Follow-up Information    Ashten Sarnowski M, MD Follow up in 3 week(s).   Specialty:  General Surgery Why:  For wound re-check Contact information: [place] [address] [phone]           Earnstine Regal, MD, [facility] Office: [phone]   Signed: Earnstine Regal 11/23/2016, 7:56 AM

## 2016-11-23 NOTE — Progress Notes (Signed)
Discharge instructions and prescription provided to patient.  Vital signs stable and pain controlled. Questions answered.

## 2016-12-17 HISTORY — PX: ROTATOR CUFF REPAIR: SHX139

## 2017-01-08 DIAGNOSIS — M359 Systemic involvement of connective tissue, unspecified: Secondary | ICD-10-CM | POA: Diagnosis not present

## 2017-01-08 DIAGNOSIS — M150 Primary generalized (osteo)arthritis: Secondary | ICD-10-CM | POA: Diagnosis not present

## 2017-01-08 DIAGNOSIS — M797 Fibromyalgia: Secondary | ICD-10-CM | POA: Diagnosis not present

## 2017-01-08 DIAGNOSIS — M549 Dorsalgia, unspecified: Secondary | ICD-10-CM | POA: Diagnosis not present

## 2017-02-27 ENCOUNTER — Other Ambulatory Visit: Payer: Self-pay | Admitting: Family Medicine

## 2017-02-27 DIAGNOSIS — Z1231 Encounter for screening mammogram for malignant neoplasm of breast: Secondary | ICD-10-CM

## 2017-02-28 ENCOUNTER — Ambulatory Visit
Admission: RE | Admit: 2017-02-28 | Discharge: 2017-02-28 | Disposition: A | Discharge status: Home / Self Care | Payer: Medicare Other | Source: Ambulatory Visit | Attending: Family Medicine | Admitting: Family Medicine

## 2017-02-28 DIAGNOSIS — Z1231 Encounter for screening mammogram for malignant neoplasm of breast: Secondary | ICD-10-CM

## 2017-03-04 DIAGNOSIS — M351 Other overlap syndromes: Secondary | ICD-10-CM | POA: Diagnosis not present

## 2017-03-04 DIAGNOSIS — R7301 Impaired fasting glucose: Secondary | ICD-10-CM | POA: Diagnosis not present

## 2017-03-04 DIAGNOSIS — Z791 Long term (current) use of non-steroidal anti-inflammatories (NSAID): Secondary | ICD-10-CM | POA: Diagnosis not present

## 2017-03-04 DIAGNOSIS — M8589 Other specified disorders of bone density and structure, multiple sites: Secondary | ICD-10-CM | POA: Diagnosis not present

## 2017-03-04 DIAGNOSIS — R251 Tremor, unspecified: Secondary | ICD-10-CM | POA: Diagnosis not present

## 2017-03-04 DIAGNOSIS — K219 Gastro-esophageal reflux disease without esophagitis: Secondary | ICD-10-CM | POA: Diagnosis not present

## 2017-03-04 DIAGNOSIS — Z8371 Family history of colonic polyps: Secondary | ICD-10-CM | POA: Diagnosis not present

## 2017-03-04 DIAGNOSIS — F3342 Major depressive disorder, recurrent, in full remission: Secondary | ICD-10-CM | POA: Diagnosis not present

## 2017-03-04 DIAGNOSIS — E782 Mixed hyperlipidemia: Secondary | ICD-10-CM | POA: Diagnosis not present

## 2017-03-04 DIAGNOSIS — Z0000 Encounter for general adult medical examination without abnormal findings: Secondary | ICD-10-CM | POA: Diagnosis not present

## 2017-03-04 DIAGNOSIS — E559 Vitamin D deficiency, unspecified: Secondary | ICD-10-CM | POA: Diagnosis not present

## 2017-03-04 DIAGNOSIS — M1990 Unspecified osteoarthritis, unspecified site: Secondary | ICD-10-CM | POA: Diagnosis not present

## 2017-05-22 DIAGNOSIS — H5213 Myopia, bilateral: Secondary | ICD-10-CM | POA: Diagnosis not present

## 2017-05-22 DIAGNOSIS — H25813 Combined forms of age-related cataract, bilateral: Secondary | ICD-10-CM | POA: Diagnosis not present

## 2017-05-22 DIAGNOSIS — H52223 Regular astigmatism, bilateral: Secondary | ICD-10-CM | POA: Diagnosis not present

## 2017-05-24 DIAGNOSIS — M19011 Primary osteoarthritis, right shoulder: Secondary | ICD-10-CM | POA: Diagnosis not present

## 2017-05-24 DIAGNOSIS — M25511 Pain in right shoulder: Secondary | ICD-10-CM | POA: Diagnosis not present

## 2017-06-17 DIAGNOSIS — M25511 Pain in right shoulder: Secondary | ICD-10-CM | POA: Diagnosis not present

## 2017-06-20 DIAGNOSIS — M25511 Pain in right shoulder: Secondary | ICD-10-CM | POA: Diagnosis not present

## 2017-06-25 DIAGNOSIS — M25511 Pain in right shoulder: Secondary | ICD-10-CM | POA: Diagnosis not present

## 2017-06-27 DIAGNOSIS — M25511 Pain in right shoulder: Secondary | ICD-10-CM | POA: Diagnosis not present

## 2017-07-02 DIAGNOSIS — M25511 Pain in right shoulder: Secondary | ICD-10-CM | POA: Diagnosis not present

## 2017-07-08 DIAGNOSIS — M19041 Primary osteoarthritis, right hand: Secondary | ICD-10-CM | POA: Diagnosis not present

## 2017-07-08 DIAGNOSIS — M19042 Primary osteoarthritis, left hand: Secondary | ICD-10-CM | POA: Diagnosis not present

## 2017-07-08 DIAGNOSIS — M797 Fibromyalgia: Secondary | ICD-10-CM | POA: Diagnosis not present

## 2017-07-08 DIAGNOSIS — M25511 Pain in right shoulder: Secondary | ICD-10-CM | POA: Diagnosis not present

## 2017-07-08 DIAGNOSIS — M549 Dorsalgia, unspecified: Secondary | ICD-10-CM | POA: Diagnosis not present

## 2017-07-08 DIAGNOSIS — M150 Primary generalized (osteo)arthritis: Secondary | ICD-10-CM | POA: Diagnosis not present

## 2017-07-08 DIAGNOSIS — D8989 Other specified disorders involving the immune mechanism, not elsewhere classified: Secondary | ICD-10-CM | POA: Diagnosis not present

## 2017-07-08 DIAGNOSIS — M359 Systemic involvement of connective tissue, unspecified: Secondary | ICD-10-CM | POA: Diagnosis not present

## 2017-07-08 DIAGNOSIS — M79643 Pain in unspecified hand: Secondary | ICD-10-CM | POA: Diagnosis not present

## 2017-07-12 DIAGNOSIS — M25511 Pain in right shoulder: Secondary | ICD-10-CM | POA: Diagnosis not present

## 2017-07-23 DIAGNOSIS — M19011 Primary osteoarthritis, right shoulder: Secondary | ICD-10-CM | POA: Diagnosis not present

## 2017-07-23 DIAGNOSIS — M75121 Complete rotator cuff tear or rupture of right shoulder, not specified as traumatic: Secondary | ICD-10-CM | POA: Diagnosis not present

## 2017-08-01 DIAGNOSIS — Z01810 Encounter for preprocedural cardiovascular examination: Secondary | ICD-10-CM | POA: Diagnosis not present

## 2017-08-01 DIAGNOSIS — M12811 Other specific arthropathies, not elsewhere classified, right shoulder: Secondary | ICD-10-CM | POA: Diagnosis not present

## 2017-08-01 DIAGNOSIS — R7301 Impaired fasting glucose: Secondary | ICD-10-CM | POA: Diagnosis not present

## 2017-08-01 DIAGNOSIS — K219 Gastro-esophageal reflux disease without esophagitis: Secondary | ICD-10-CM | POA: Diagnosis not present

## 2017-08-01 DIAGNOSIS — E782 Mixed hyperlipidemia: Secondary | ICD-10-CM | POA: Diagnosis not present

## 2017-08-14 DIAGNOSIS — M7551 Bursitis of right shoulder: Secondary | ICD-10-CM | POA: Diagnosis not present

## 2017-08-14 DIAGNOSIS — M75121 Complete rotator cuff tear or rupture of right shoulder, not specified as traumatic: Secondary | ICD-10-CM | POA: Diagnosis not present

## 2017-08-14 DIAGNOSIS — M7521 Bicipital tendinitis, right shoulder: Secondary | ICD-10-CM | POA: Diagnosis not present

## 2017-08-14 DIAGNOSIS — M24111 Other articular cartilage disorders, right shoulder: Secondary | ICD-10-CM | POA: Diagnosis not present

## 2017-08-14 DIAGNOSIS — G8918 Other acute postprocedural pain: Secondary | ICD-10-CM | POA: Diagnosis not present

## 2017-08-14 DIAGNOSIS — S46891A Other injury of other muscles, fascia and tendons at shoulder and upper arm level, right arm, initial encounter: Secondary | ICD-10-CM | POA: Diagnosis not present

## 2017-08-14 DIAGNOSIS — M7541 Impingement syndrome of right shoulder: Secondary | ICD-10-CM | POA: Diagnosis not present

## 2017-08-14 DIAGNOSIS — M19011 Primary osteoarthritis, right shoulder: Secondary | ICD-10-CM | POA: Diagnosis not present

## 2017-08-22 DIAGNOSIS — Z4789 Encounter for other orthopedic aftercare: Secondary | ICD-10-CM | POA: Diagnosis not present

## 2017-08-22 DIAGNOSIS — M75121 Complete rotator cuff tear or rupture of right shoulder, not specified as traumatic: Secondary | ICD-10-CM | POA: Diagnosis not present

## 2017-09-04 DIAGNOSIS — Z23 Encounter for immunization: Secondary | ICD-10-CM | POA: Diagnosis not present

## 2017-09-04 DIAGNOSIS — R7301 Impaired fasting glucose: Secondary | ICD-10-CM | POA: Diagnosis not present

## 2017-09-04 DIAGNOSIS — E559 Vitamin D deficiency, unspecified: Secondary | ICD-10-CM | POA: Diagnosis not present

## 2017-09-04 DIAGNOSIS — Z791 Long term (current) use of non-steroidal anti-inflammatories (NSAID): Secondary | ICD-10-CM | POA: Diagnosis not present

## 2017-09-04 DIAGNOSIS — M12811 Other specific arthropathies, not elsewhere classified, right shoulder: Secondary | ICD-10-CM | POA: Diagnosis not present

## 2017-09-04 DIAGNOSIS — R251 Tremor, unspecified: Secondary | ICD-10-CM | POA: Diagnosis not present

## 2017-09-04 DIAGNOSIS — M6089 Other myositis, multiple sites: Secondary | ICD-10-CM | POA: Diagnosis not present

## 2017-09-04 DIAGNOSIS — E782 Mixed hyperlipidemia: Secondary | ICD-10-CM | POA: Diagnosis not present

## 2017-09-04 DIAGNOSIS — K5900 Constipation, unspecified: Secondary | ICD-10-CM | POA: Diagnosis not present

## 2017-09-04 DIAGNOSIS — R12 Heartburn: Secondary | ICD-10-CM | POA: Diagnosis not present

## 2017-09-04 DIAGNOSIS — M8589 Other specified disorders of bone density and structure, multiple sites: Secondary | ICD-10-CM | POA: Diagnosis not present

## 2017-09-04 DIAGNOSIS — G479 Sleep disorder, unspecified: Secondary | ICD-10-CM | POA: Diagnosis not present

## 2017-09-17 DIAGNOSIS — M75121 Complete rotator cuff tear or rupture of right shoulder, not specified as traumatic: Secondary | ICD-10-CM | POA: Diagnosis not present

## 2017-09-17 DIAGNOSIS — Z4789 Encounter for other orthopedic aftercare: Secondary | ICD-10-CM | POA: Diagnosis not present

## 2017-09-23 DIAGNOSIS — M25611 Stiffness of right shoulder, not elsewhere classified: Secondary | ICD-10-CM | POA: Diagnosis not present

## 2017-09-30 DIAGNOSIS — M25611 Stiffness of right shoulder, not elsewhere classified: Secondary | ICD-10-CM | POA: Diagnosis not present

## 2017-10-07 DIAGNOSIS — M25611 Stiffness of right shoulder, not elsewhere classified: Secondary | ICD-10-CM | POA: Diagnosis not present

## 2017-10-10 DIAGNOSIS — M25611 Stiffness of right shoulder, not elsewhere classified: Secondary | ICD-10-CM | POA: Diagnosis not present

## 2017-10-16 DIAGNOSIS — Z4789 Encounter for other orthopedic aftercare: Secondary | ICD-10-CM | POA: Diagnosis not present

## 2017-10-16 DIAGNOSIS — M25611 Stiffness of right shoulder, not elsewhere classified: Secondary | ICD-10-CM | POA: Diagnosis not present

## 2017-10-16 DIAGNOSIS — M75121 Complete rotator cuff tear or rupture of right shoulder, not specified as traumatic: Secondary | ICD-10-CM | POA: Diagnosis not present

## 2017-10-23 DIAGNOSIS — M25611 Stiffness of right shoulder, not elsewhere classified: Secondary | ICD-10-CM | POA: Diagnosis not present

## 2017-10-29 DIAGNOSIS — M25611 Stiffness of right shoulder, not elsewhere classified: Secondary | ICD-10-CM | POA: Diagnosis not present

## 2017-10-31 DIAGNOSIS — M25611 Stiffness of right shoulder, not elsewhere classified: Secondary | ICD-10-CM | POA: Diagnosis not present

## 2017-11-13 DIAGNOSIS — M25611 Stiffness of right shoulder, not elsewhere classified: Secondary | ICD-10-CM | POA: Diagnosis not present

## 2017-11-14 DIAGNOSIS — M75121 Complete rotator cuff tear or rupture of right shoulder, not specified as traumatic: Secondary | ICD-10-CM | POA: Diagnosis not present

## 2017-11-21 DIAGNOSIS — M25611 Stiffness of right shoulder, not elsewhere classified: Secondary | ICD-10-CM | POA: Diagnosis not present

## 2017-12-04 DIAGNOSIS — M25611 Stiffness of right shoulder, not elsewhere classified: Secondary | ICD-10-CM | POA: Diagnosis not present

## 2018-01-08 DIAGNOSIS — M797 Fibromyalgia: Secondary | ICD-10-CM | POA: Diagnosis not present

## 2018-01-08 DIAGNOSIS — M810 Age-related osteoporosis without current pathological fracture: Secondary | ICD-10-CM | POA: Diagnosis not present

## 2018-01-08 DIAGNOSIS — M064 Inflammatory polyarthropathy: Secondary | ICD-10-CM | POA: Diagnosis not present

## 2018-01-08 DIAGNOSIS — M359 Systemic involvement of connective tissue, unspecified: Secondary | ICD-10-CM | POA: Diagnosis not present

## 2018-01-08 DIAGNOSIS — M150 Primary generalized (osteo)arthritis: Secondary | ICD-10-CM | POA: Diagnosis not present

## 2018-01-08 DIAGNOSIS — M79643 Pain in unspecified hand: Secondary | ICD-10-CM | POA: Diagnosis not present

## 2018-01-08 DIAGNOSIS — M549 Dorsalgia, unspecified: Secondary | ICD-10-CM | POA: Diagnosis not present

## 2018-01-08 DIAGNOSIS — I700 Atherosclerosis of aorta: Secondary | ICD-10-CM | POA: Diagnosis not present

## 2018-01-29 DIAGNOSIS — M75120 Complete rotator cuff tear or rupture of unspecified shoulder, not specified as traumatic: Secondary | ICD-10-CM | POA: Insufficient documentation

## 2018-01-29 DIAGNOSIS — M75121 Complete rotator cuff tear or rupture of right shoulder, not specified as traumatic: Secondary | ICD-10-CM | POA: Diagnosis not present

## 2018-02-05 DIAGNOSIS — M064 Inflammatory polyarthropathy: Secondary | ICD-10-CM | POA: Diagnosis not present

## 2018-02-05 DIAGNOSIS — M150 Primary generalized (osteo)arthritis: Secondary | ICD-10-CM | POA: Diagnosis not present

## 2018-02-05 DIAGNOSIS — M549 Dorsalgia, unspecified: Secondary | ICD-10-CM | POA: Diagnosis not present

## 2018-02-05 DIAGNOSIS — M79643 Pain in unspecified hand: Secondary | ICD-10-CM | POA: Diagnosis not present

## 2018-02-05 DIAGNOSIS — M797 Fibromyalgia: Secondary | ICD-10-CM | POA: Diagnosis not present

## 2018-02-05 DIAGNOSIS — M359 Systemic involvement of connective tissue, unspecified: Secondary | ICD-10-CM | POA: Diagnosis not present

## 2018-02-05 DIAGNOSIS — M810 Age-related osteoporosis without current pathological fracture: Secondary | ICD-10-CM | POA: Diagnosis not present

## 2018-03-06 DIAGNOSIS — Z124 Encounter for screening for malignant neoplasm of cervix: Secondary | ICD-10-CM | POA: Diagnosis not present

## 2018-03-06 DIAGNOSIS — F3342 Major depressive disorder, recurrent, in full remission: Secondary | ICD-10-CM | POA: Diagnosis not present

## 2018-03-06 DIAGNOSIS — Z8371 Family history of colonic polyps: Secondary | ICD-10-CM | POA: Diagnosis not present

## 2018-03-06 DIAGNOSIS — M351 Other overlap syndromes: Secondary | ICD-10-CM | POA: Diagnosis not present

## 2018-03-06 DIAGNOSIS — M8589 Other specified disorders of bone density and structure, multiple sites: Secondary | ICD-10-CM | POA: Diagnosis not present

## 2018-03-06 DIAGNOSIS — E559 Vitamin D deficiency, unspecified: Secondary | ICD-10-CM | POA: Diagnosis not present

## 2018-03-06 DIAGNOSIS — Z0000 Encounter for general adult medical examination without abnormal findings: Secondary | ICD-10-CM | POA: Diagnosis not present

## 2018-03-06 DIAGNOSIS — G479 Sleep disorder, unspecified: Secondary | ICD-10-CM | POA: Diagnosis not present

## 2018-03-06 DIAGNOSIS — L659 Nonscarring hair loss, unspecified: Secondary | ICD-10-CM | POA: Diagnosis not present

## 2018-03-06 DIAGNOSIS — K219 Gastro-esophageal reflux disease without esophagitis: Secondary | ICD-10-CM | POA: Diagnosis not present

## 2018-03-06 DIAGNOSIS — R251 Tremor, unspecified: Secondary | ICD-10-CM | POA: Diagnosis not present

## 2018-03-06 DIAGNOSIS — Z791 Long term (current) use of non-steroidal anti-inflammatories (NSAID): Secondary | ICD-10-CM | POA: Diagnosis not present

## 2018-03-06 DIAGNOSIS — E782 Mixed hyperlipidemia: Secondary | ICD-10-CM | POA: Diagnosis not present

## 2018-04-02 ENCOUNTER — Other Ambulatory Visit: Payer: Self-pay | Admitting: Family Medicine

## 2018-04-02 DIAGNOSIS — Z1231 Encounter for screening mammogram for malignant neoplasm of breast: Secondary | ICD-10-CM

## 2018-04-03 DIAGNOSIS — M797 Fibromyalgia: Secondary | ICD-10-CM | POA: Diagnosis not present

## 2018-04-03 DIAGNOSIS — M064 Inflammatory polyarthropathy: Secondary | ICD-10-CM | POA: Diagnosis not present

## 2018-04-03 DIAGNOSIS — M810 Age-related osteoporosis without current pathological fracture: Secondary | ICD-10-CM | POA: Diagnosis not present

## 2018-04-03 DIAGNOSIS — M549 Dorsalgia, unspecified: Secondary | ICD-10-CM | POA: Diagnosis not present

## 2018-04-03 DIAGNOSIS — M79643 Pain in unspecified hand: Secondary | ICD-10-CM | POA: Diagnosis not present

## 2018-04-03 DIAGNOSIS — M150 Primary generalized (osteo)arthritis: Secondary | ICD-10-CM | POA: Diagnosis not present

## 2018-04-03 DIAGNOSIS — M359 Systemic involvement of connective tissue, unspecified: Secondary | ICD-10-CM | POA: Diagnosis not present

## 2018-04-30 ENCOUNTER — Ambulatory Visit
Admission: RE | Admit: 2018-04-30 | Discharge: 2018-04-30 | Disposition: A | Discharge status: Home / Self Care | Payer: Medicare Other | Source: Ambulatory Visit | Attending: Family Medicine | Admitting: Family Medicine

## 2018-04-30 DIAGNOSIS — Z1231 Encounter for screening mammogram for malignant neoplasm of breast: Secondary | ICD-10-CM | POA: Diagnosis not present

## 2018-05-21 DIAGNOSIS — M8588 Other specified disorders of bone density and structure, other site: Secondary | ICD-10-CM | POA: Diagnosis not present

## 2018-06-05 DIAGNOSIS — H52223 Regular astigmatism, bilateral: Secondary | ICD-10-CM | POA: Diagnosis not present

## 2018-06-05 DIAGNOSIS — H524 Presbyopia: Secondary | ICD-10-CM | POA: Diagnosis not present

## 2018-06-05 DIAGNOSIS — H25813 Combined forms of age-related cataract, bilateral: Secondary | ICD-10-CM | POA: Diagnosis not present

## 2018-06-05 DIAGNOSIS — H5213 Myopia, bilateral: Secondary | ICD-10-CM | POA: Diagnosis not present

## 2018-06-06 DIAGNOSIS — M1380 Other specified arthritis, unspecified site: Secondary | ICD-10-CM | POA: Diagnosis not present

## 2018-06-06 DIAGNOSIS — M545 Low back pain: Secondary | ICD-10-CM | POA: Diagnosis not present

## 2018-06-06 DIAGNOSIS — M7989 Other specified soft tissue disorders: Secondary | ICD-10-CM | POA: Diagnosis not present

## 2018-06-06 DIAGNOSIS — M810 Age-related osteoporosis without current pathological fracture: Secondary | ICD-10-CM | POA: Diagnosis not present

## 2018-06-06 DIAGNOSIS — M150 Primary generalized (osteo)arthritis: Secondary | ICD-10-CM | POA: Diagnosis not present

## 2018-06-06 DIAGNOSIS — M79643 Pain in unspecified hand: Secondary | ICD-10-CM | POA: Diagnosis not present

## 2018-06-06 DIAGNOSIS — M797 Fibromyalgia: Secondary | ICD-10-CM | POA: Diagnosis not present

## 2018-06-06 DIAGNOSIS — M064 Inflammatory polyarthropathy: Secondary | ICD-10-CM | POA: Diagnosis not present

## 2018-06-06 DIAGNOSIS — M359 Systemic involvement of connective tissue, unspecified: Secondary | ICD-10-CM | POA: Diagnosis not present

## 2018-06-06 DIAGNOSIS — M0609 Rheumatoid arthritis without rheumatoid factor, multiple sites: Secondary | ICD-10-CM | POA: Diagnosis not present

## 2018-06-25 DIAGNOSIS — M359 Systemic involvement of connective tissue, unspecified: Secondary | ICD-10-CM | POA: Diagnosis not present

## 2018-06-25 DIAGNOSIS — M0609 Rheumatoid arthritis without rheumatoid factor, multiple sites: Secondary | ICD-10-CM | POA: Diagnosis not present

## 2018-06-26 DIAGNOSIS — M1712 Unilateral primary osteoarthritis, left knee: Secondary | ICD-10-CM | POA: Diagnosis not present

## 2018-06-26 DIAGNOSIS — Z96651 Presence of right artificial knee joint: Secondary | ICD-10-CM | POA: Diagnosis not present

## 2018-06-26 DIAGNOSIS — M25562 Pain in left knee: Secondary | ICD-10-CM | POA: Insufficient documentation

## 2018-06-26 DIAGNOSIS — M170 Bilateral primary osteoarthritis of knee: Secondary | ICD-10-CM | POA: Diagnosis not present

## 2018-06-26 DIAGNOSIS — Z471 Aftercare following joint replacement surgery: Secondary | ICD-10-CM | POA: Diagnosis not present

## 2018-07-09 DIAGNOSIS — M0609 Rheumatoid arthritis without rheumatoid factor, multiple sites: Secondary | ICD-10-CM | POA: Diagnosis not present

## 2018-07-24 DIAGNOSIS — M0609 Rheumatoid arthritis without rheumatoid factor, multiple sites: Secondary | ICD-10-CM | POA: Diagnosis not present

## 2018-08-06 DIAGNOSIS — M0609 Rheumatoid arthritis without rheumatoid factor, multiple sites: Secondary | ICD-10-CM | POA: Diagnosis not present

## 2018-08-06 DIAGNOSIS — M79643 Pain in unspecified hand: Secondary | ICD-10-CM | POA: Diagnosis not present

## 2018-08-06 DIAGNOSIS — M359 Systemic involvement of connective tissue, unspecified: Secondary | ICD-10-CM | POA: Diagnosis not present

## 2018-08-06 DIAGNOSIS — M7989 Other specified soft tissue disorders: Secondary | ICD-10-CM | POA: Diagnosis not present

## 2018-08-06 DIAGNOSIS — M810 Age-related osteoporosis without current pathological fracture: Secondary | ICD-10-CM | POA: Diagnosis not present

## 2018-08-06 DIAGNOSIS — M545 Low back pain: Secondary | ICD-10-CM | POA: Diagnosis not present

## 2018-08-06 DIAGNOSIS — M150 Primary generalized (osteo)arthritis: Secondary | ICD-10-CM | POA: Diagnosis not present

## 2018-08-06 DIAGNOSIS — M797 Fibromyalgia: Secondary | ICD-10-CM | POA: Diagnosis not present

## 2018-08-06 DIAGNOSIS — M064 Inflammatory polyarthropathy: Secondary | ICD-10-CM | POA: Diagnosis not present

## 2018-08-21 DIAGNOSIS — M0609 Rheumatoid arthritis without rheumatoid factor, multiple sites: Secondary | ICD-10-CM | POA: Diagnosis not present

## 2018-08-21 DIAGNOSIS — M359 Systemic involvement of connective tissue, unspecified: Secondary | ICD-10-CM | POA: Diagnosis not present

## 2018-08-27 DIAGNOSIS — R110 Nausea: Secondary | ICD-10-CM | POA: Diagnosis not present

## 2018-08-27 DIAGNOSIS — Z1211 Encounter for screening for malignant neoplasm of colon: Secondary | ICD-10-CM | POA: Diagnosis not present

## 2018-08-27 DIAGNOSIS — K219 Gastro-esophageal reflux disease without esophagitis: Secondary | ICD-10-CM | POA: Diagnosis not present

## 2018-08-27 DIAGNOSIS — K648 Other hemorrhoids: Secondary | ICD-10-CM | POA: Diagnosis not present

## 2018-08-27 DIAGNOSIS — K5900 Constipation, unspecified: Secondary | ICD-10-CM | POA: Diagnosis not present

## 2018-08-27 DIAGNOSIS — K625 Hemorrhage of anus and rectum: Secondary | ICD-10-CM | POA: Diagnosis not present

## 2018-08-27 DIAGNOSIS — M4316 Spondylolisthesis, lumbar region: Secondary | ICD-10-CM | POA: Insufficient documentation

## 2018-08-27 DIAGNOSIS — M5136 Other intervertebral disc degeneration, lumbar region: Secondary | ICD-10-CM | POA: Insufficient documentation

## 2018-08-28 DIAGNOSIS — Z1211 Encounter for screening for malignant neoplasm of colon: Secondary | ICD-10-CM | POA: Diagnosis not present

## 2018-09-02 DIAGNOSIS — Z23 Encounter for immunization: Secondary | ICD-10-CM | POA: Diagnosis not present

## 2018-09-02 DIAGNOSIS — F3342 Major depressive disorder, recurrent, in full remission: Secondary | ICD-10-CM | POA: Diagnosis not present

## 2018-09-02 DIAGNOSIS — K219 Gastro-esophageal reflux disease without esophagitis: Secondary | ICD-10-CM | POA: Diagnosis not present

## 2018-09-02 DIAGNOSIS — M1990 Unspecified osteoarthritis, unspecified site: Secondary | ICD-10-CM | POA: Diagnosis not present

## 2018-09-02 DIAGNOSIS — G479 Sleep disorder, unspecified: Secondary | ICD-10-CM | POA: Diagnosis not present

## 2018-09-02 DIAGNOSIS — E782 Mixed hyperlipidemia: Secondary | ICD-10-CM | POA: Diagnosis not present

## 2018-09-02 DIAGNOSIS — E559 Vitamin D deficiency, unspecified: Secondary | ICD-10-CM | POA: Diagnosis not present

## 2018-09-02 DIAGNOSIS — R251 Tremor, unspecified: Secondary | ICD-10-CM | POA: Diagnosis not present

## 2018-09-02 DIAGNOSIS — K5900 Constipation, unspecified: Secondary | ICD-10-CM | POA: Diagnosis not present

## 2018-09-02 DIAGNOSIS — M351 Other overlap syndromes: Secondary | ICD-10-CM | POA: Diagnosis not present

## 2018-09-02 DIAGNOSIS — Z791 Long term (current) use of non-steroidal anti-inflammatories (NSAID): Secondary | ICD-10-CM | POA: Diagnosis not present

## 2018-09-02 DIAGNOSIS — Z6833 Body mass index (BMI) 33.0-33.9, adult: Secondary | ICD-10-CM | POA: Diagnosis not present

## 2018-09-09 DIAGNOSIS — M4316 Spondylolisthesis, lumbar region: Secondary | ICD-10-CM | POA: Diagnosis not present

## 2018-09-16 DIAGNOSIS — M4316 Spondylolisthesis, lumbar region: Secondary | ICD-10-CM | POA: Diagnosis not present

## 2018-09-18 DIAGNOSIS — M0609 Rheumatoid arthritis without rheumatoid factor, multiple sites: Secondary | ICD-10-CM | POA: Diagnosis not present

## 2018-09-23 DIAGNOSIS — M4316 Spondylolisthesis, lumbar region: Secondary | ICD-10-CM | POA: Diagnosis not present

## 2018-09-26 DIAGNOSIS — M4316 Spondylolisthesis, lumbar region: Secondary | ICD-10-CM | POA: Diagnosis not present

## 2018-09-30 DIAGNOSIS — M4316 Spondylolisthesis, lumbar region: Secondary | ICD-10-CM | POA: Diagnosis not present

## 2018-10-03 DIAGNOSIS — D2371 Other benign neoplasm of skin of right lower limb, including hip: Secondary | ICD-10-CM | POA: Diagnosis not present

## 2018-10-03 DIAGNOSIS — D225 Melanocytic nevi of trunk: Secondary | ICD-10-CM | POA: Diagnosis not present

## 2018-10-03 DIAGNOSIS — M4316 Spondylolisthesis, lumbar region: Secondary | ICD-10-CM | POA: Diagnosis not present

## 2018-10-03 DIAGNOSIS — Z85828 Personal history of other malignant neoplasm of skin: Secondary | ICD-10-CM | POA: Diagnosis not present

## 2018-10-03 DIAGNOSIS — L738 Other specified follicular disorders: Secondary | ICD-10-CM | POA: Diagnosis not present

## 2018-10-03 DIAGNOSIS — L821 Other seborrheic keratosis: Secondary | ICD-10-CM | POA: Diagnosis not present

## 2018-10-03 DIAGNOSIS — L578 Other skin changes due to chronic exposure to nonionizing radiation: Secondary | ICD-10-CM | POA: Diagnosis not present

## 2018-10-03 DIAGNOSIS — D1801 Hemangioma of skin and subcutaneous tissue: Secondary | ICD-10-CM | POA: Diagnosis not present

## 2018-10-03 DIAGNOSIS — L814 Other melanin hyperpigmentation: Secondary | ICD-10-CM | POA: Diagnosis not present

## 2018-10-03 DIAGNOSIS — L918 Other hypertrophic disorders of the skin: Secondary | ICD-10-CM | POA: Diagnosis not present

## 2018-10-07 DIAGNOSIS — M4316 Spondylolisthesis, lumbar region: Secondary | ICD-10-CM | POA: Diagnosis not present

## 2018-10-10 DIAGNOSIS — M4316 Spondylolisthesis, lumbar region: Secondary | ICD-10-CM | POA: Diagnosis not present

## 2018-10-14 DIAGNOSIS — M4316 Spondylolisthesis, lumbar region: Secondary | ICD-10-CM | POA: Diagnosis not present

## 2018-10-16 DIAGNOSIS — M0609 Rheumatoid arthritis without rheumatoid factor, multiple sites: Secondary | ICD-10-CM | POA: Diagnosis not present

## 2018-10-20 DIAGNOSIS — M4316 Spondylolisthesis, lumbar region: Secondary | ICD-10-CM | POA: Diagnosis not present

## 2018-11-04 DIAGNOSIS — C169 Malignant neoplasm of stomach, unspecified: Secondary | ICD-10-CM | POA: Diagnosis not present

## 2018-11-04 DIAGNOSIS — R12 Heartburn: Secondary | ICD-10-CM | POA: Diagnosis not present

## 2018-11-04 DIAGNOSIS — K3189 Other diseases of stomach and duodenum: Secondary | ICD-10-CM | POA: Diagnosis not present

## 2018-11-04 DIAGNOSIS — K219 Gastro-esophageal reflux disease without esophagitis: Secondary | ICD-10-CM | POA: Diagnosis not present

## 2018-11-04 DIAGNOSIS — K317 Polyp of stomach and duodenum: Secondary | ICD-10-CM | POA: Diagnosis not present

## 2018-11-04 DIAGNOSIS — K2930 Chronic superficial gastritis without bleeding: Secondary | ICD-10-CM | POA: Diagnosis not present

## 2018-11-04 DIAGNOSIS — K2900 Acute gastritis without bleeding: Secondary | ICD-10-CM | POA: Diagnosis not present

## 2018-11-06 DIAGNOSIS — M7989 Other specified soft tissue disorders: Secondary | ICD-10-CM | POA: Diagnosis not present

## 2018-11-06 DIAGNOSIS — M545 Low back pain: Secondary | ICD-10-CM | POA: Diagnosis not present

## 2018-11-06 DIAGNOSIS — M797 Fibromyalgia: Secondary | ICD-10-CM | POA: Diagnosis not present

## 2018-11-06 DIAGNOSIS — M150 Primary generalized (osteo)arthritis: Secondary | ICD-10-CM | POA: Diagnosis not present

## 2018-11-06 DIAGNOSIS — M359 Systemic involvement of connective tissue, unspecified: Secondary | ICD-10-CM | POA: Diagnosis not present

## 2018-11-06 DIAGNOSIS — M0609 Rheumatoid arthritis without rheumatoid factor, multiple sites: Secondary | ICD-10-CM | POA: Diagnosis not present

## 2018-11-06 DIAGNOSIS — M064 Inflammatory polyarthropathy: Secondary | ICD-10-CM | POA: Diagnosis not present

## 2018-11-06 DIAGNOSIS — M79643 Pain in unspecified hand: Secondary | ICD-10-CM | POA: Diagnosis not present

## 2018-11-06 DIAGNOSIS — M810 Age-related osteoporosis without current pathological fracture: Secondary | ICD-10-CM | POA: Diagnosis not present

## 2018-11-11 DIAGNOSIS — K2930 Chronic superficial gastritis without bleeding: Secondary | ICD-10-CM | POA: Diagnosis not present

## 2018-11-11 DIAGNOSIS — C169 Malignant neoplasm of stomach, unspecified: Secondary | ICD-10-CM | POA: Diagnosis not present

## 2018-11-12 ENCOUNTER — Other Ambulatory Visit: Payer: Self-pay | Admitting: Gastroenterology

## 2018-11-12 DIAGNOSIS — C169 Malignant neoplasm of stomach, unspecified: Secondary | ICD-10-CM

## 2018-11-17 ENCOUNTER — Ambulatory Visit
Admission: RE | Admit: 2018-11-17 | Discharge: 2018-11-17 | Disposition: A | Discharge status: Home / Self Care | Payer: Medicare Other | Source: Ambulatory Visit | Attending: Gastroenterology | Admitting: Gastroenterology

## 2018-11-17 DIAGNOSIS — C169 Malignant neoplasm of stomach, unspecified: Secondary | ICD-10-CM

## 2018-11-17 DIAGNOSIS — D490 Neoplasm of unspecified behavior of digestive system: Secondary | ICD-10-CM | POA: Diagnosis not present

## 2018-11-17 MED ORDER — IOHEXOL 300 MG/ML  SOLN
100.0000 mL | Freq: Once | INTRAMUSCULAR | Status: AC | PRN
  Administered 2018-11-17: 100 mL via INTRAVENOUS

## 2018-11-24 ENCOUNTER — Other Ambulatory Visit: Payer: Self-pay | Admitting: General Surgery

## 2018-11-24 DIAGNOSIS — M069 Rheumatoid arthritis, unspecified: Secondary | ICD-10-CM | POA: Diagnosis not present

## 2018-11-24 DIAGNOSIS — C162 Malignant neoplasm of body of stomach: Secondary | ICD-10-CM | POA: Diagnosis not present

## 2018-11-24 DIAGNOSIS — Z809 Family history of malignant neoplasm, unspecified: Secondary | ICD-10-CM | POA: Diagnosis not present

## 2018-11-27 DIAGNOSIS — C169 Malignant neoplasm of stomach, unspecified: Secondary | ICD-10-CM | POA: Diagnosis not present

## 2018-11-27 DIAGNOSIS — K3189 Other diseases of stomach and duodenum: Secondary | ICD-10-CM | POA: Diagnosis not present

## 2018-11-27 DIAGNOSIS — K259 Gastric ulcer, unspecified as acute or chronic, without hemorrhage or perforation: Secondary | ICD-10-CM | POA: Diagnosis not present

## 2018-12-02 ENCOUNTER — Other Ambulatory Visit: Payer: Self-pay | Admitting: General Surgery

## 2018-12-02 DIAGNOSIS — C169 Malignant neoplasm of stomach, unspecified: Secondary | ICD-10-CM | POA: Diagnosis not present

## 2018-12-02 DIAGNOSIS — R110 Nausea: Secondary | ICD-10-CM | POA: Diagnosis not present

## 2018-12-02 DIAGNOSIS — C162 Malignant neoplasm of body of stomach: Secondary | ICD-10-CM | POA: Diagnosis not present

## 2018-12-02 DIAGNOSIS — Z6833 Body mass index (BMI) 33.0-33.9, adult: Secondary | ICD-10-CM | POA: Diagnosis not present

## 2018-12-02 DIAGNOSIS — M351 Other overlap syndromes: Secondary | ICD-10-CM | POA: Diagnosis not present

## 2018-12-02 DIAGNOSIS — E782 Mixed hyperlipidemia: Secondary | ICD-10-CM | POA: Diagnosis not present

## 2018-12-02 DIAGNOSIS — G479 Sleep disorder, unspecified: Secondary | ICD-10-CM | POA: Diagnosis not present

## 2018-12-02 DIAGNOSIS — I700 Atherosclerosis of aorta: Secondary | ICD-10-CM | POA: Diagnosis not present

## 2018-12-02 DIAGNOSIS — Z01810 Encounter for preprocedural cardiovascular examination: Secondary | ICD-10-CM | POA: Diagnosis not present

## 2018-12-03 ENCOUNTER — Telehealth: Payer: Self-pay | Admitting: Oncology

## 2018-12-03 ENCOUNTER — Other Ambulatory Visit: Payer: Medicare Other

## 2018-12-03 NOTE — Telephone Encounter (Signed)
Pt has been cld and scheduled to see Dr. Benay Spice on 12/30 at 2pm and have genetic counseling on the same day at 11am. Pt agreed to both appointments.

## 2018-12-15 ENCOUNTER — Inpatient Hospital Stay: Payer: Medicare Other | Attending: Oncology | Admitting: Oncology

## 2018-12-15 ENCOUNTER — Encounter: Payer: Self-pay | Admitting: Genetics

## 2018-12-15 ENCOUNTER — Inpatient Hospital Stay (HOSPITAL_BASED_OUTPATIENT_CLINIC_OR_DEPARTMENT_OTHER): Payer: Medicare Other | Admitting: Genetics

## 2018-12-15 ENCOUNTER — Inpatient Hospital Stay: Payer: Medicare Other

## 2018-12-15 ENCOUNTER — Telehealth: Payer: Self-pay | Admitting: Oncology

## 2018-12-15 VITALS — BP 160/75 | HR 70 | Temp 98.4°F | Resp 18 | Ht 61.0 in | Wt 173.7 lb

## 2018-12-15 DIAGNOSIS — C169 Malignant neoplasm of stomach, unspecified: Secondary | ICD-10-CM | POA: Diagnosis not present

## 2018-12-15 DIAGNOSIS — R911 Solitary pulmonary nodule: Secondary | ICD-10-CM

## 2018-12-15 DIAGNOSIS — Z807 Family history of other malignant neoplasms of lymphoid, hematopoietic and related tissues: Secondary | ICD-10-CM

## 2018-12-15 DIAGNOSIS — Z803 Family history of malignant neoplasm of breast: Secondary | ICD-10-CM

## 2018-12-15 DIAGNOSIS — Z808 Family history of malignant neoplasm of other organs or systems: Secondary | ICD-10-CM

## 2018-12-15 DIAGNOSIS — C162 Malignant neoplasm of body of stomach: Secondary | ICD-10-CM

## 2018-12-15 DIAGNOSIS — M351 Other overlap syndromes: Secondary | ICD-10-CM | POA: Diagnosis not present

## 2018-12-15 DIAGNOSIS — Z8041 Family history of malignant neoplasm of ovary: Secondary | ICD-10-CM | POA: Diagnosis not present

## 2018-12-15 DIAGNOSIS — Z800 Family history of malignant neoplasm of digestive organs: Secondary | ICD-10-CM | POA: Insufficient documentation

## 2018-12-15 DIAGNOSIS — Z87891 Personal history of nicotine dependence: Secondary | ICD-10-CM | POA: Diagnosis not present

## 2018-12-15 DIAGNOSIS — Z801 Family history of malignant neoplasm of trachea, bronchus and lung: Secondary | ICD-10-CM | POA: Diagnosis not present

## 2018-12-15 HISTORY — DX: Malignant neoplasm of stomach, unspecified: C16.9

## 2018-12-15 NOTE — Progress Notes (Signed)
Patient presents to office with her husband and daughter for new patient appointment. Denies any GI pain. Frequent nausea, reflux,heartburn. Chronic pain in joints/back--was on methotrexate and this was stopped 2 months ago.

## 2018-12-15 NOTE — Progress Notes (Signed)
  Oncology Nurse Navigator Documentation  Met with Koryn Charlot, husband and adult daughter. Explained role of nurse navigator. Contact names and phone numbers were provided for entire [facility] team.  Patient shared that she is apprehensive about upcoming gastric surgery. Provided information on potential physical therapy  referral for prehab . Patient will think about it and call if she is interested.  No barriers to care identified at present time.  Will continue to follow as needed

## 2018-12-15 NOTE — Patient Instructions (Signed)
Please provide copy of your Advanced Directive/Living Will at next visit to have scanned into your record.

## 2018-12-15 NOTE — Telephone Encounter (Signed)
Gave patient avs report and appointments for February.  °

## 2018-12-15 NOTE — Progress Notes (Signed)
[place] New Patient Consult   Requesting VO:[id number] Samarra Ridgely 72 y.o.  1946/09/20    Reason for Consult: Gastric cancer   HPI: Ms. Windholz reports nausea and reflux symptoms for several years.  She reports treating herself for GERD.  She was referred to Dr. Michail Sermon for persistent nausea.  She was taken to an upper endoscopy on 11/04/2018.  The esophagus appeared normal.  Linear erosions were found in the gastric antrum.  The area was biopsied.  A single 6 mm nodule with bleeding was found in the gastric body.  A biopsy was obtained.  A few small sessile fundic gland polyps were found in the gastric fundus.  The duodenum was normal. The pathology [id number]) revealed chronic inactive gastritis with no helical factor on biopsies from the distal stomach.  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 CTs of the chest, abdomen, and pelvis on 11/17/2018.  There are several small pulmonary nodules, largest 4 mm, no suspicious appearing pulmonary nodules.  No liver lesions.  The stomach appears normal.  No lymphadenopathy in the abdomen or pelvis.  No ascites.  She was referred to Dr. Barry Dienes.  She was referred to Dr. Paulita Fujita for an endoscopic ultrasound.  A cratered ulcer was found on the greater curvature of the gastric body measuring 6 mm.  The area was tattooed.  An oval subepithelial lesion was found, hypoechoic.  The lesion appeared to originate from the superficial mucosa and involve the submucosa and abuts the muscularis propria with potential invasion.  No lymphadenopathy.  No abnormality in the left lobe of the liver.  The lesion was staged as a late T1 versus early T2, N0 tumor.  She is scheduled for surgery with Dr. Barry Dienes on 01/01/2019  Past Medical History:  Diagnosis Date  . Basal cell carcinoma    back  . Depression   . Family history of breast cancer   . Family history of colon  cancer   . Family history of lung cancer   . Family history of melanoma   . Fibromyalgia   . Gastric cancer ([place]) 12/15/2018  . GERD (gastroesophageal reflux disease)   . Hyperlipidemia   . Lupus ([place])    no active Lupus symptoms  . Mixed connective tissue disease ([place])   . Osteoarthritis    knee, hand, and feet  . 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  G2 P2   .  Degenerative disc disease  Past Surgical History:  Procedure Laterality Date  . ABDOMINAL HYSTERECTOMY    . APPENDECTOMY    . BREAST EXCISIONAL BIOPSY Left 07/1991  . BREAST SURGERY Left    biopsy  . CHOLECYSTECTOMY N/A 11/22/2016   Procedure: LAPAROSCOPIC CHOLECYSTECTOMY WITH INTRAOPERATIVE CHOLANGIOGRAM;  Surgeon: Armandina Gemma, MD;  Location: [facility] ORS;  Service: General;  Laterality: N/A;  . TONSILLECTOMY    . TOTAL KNEE ARTHROPLASTY Right 07/16/2016   Procedure: RIGHT TOTAL KNEE ARTHROPLASTY;  Surgeon: Gaynelle Arabian, MD;  Location: [facility] ORS;  Service: Orthopedics;  Laterality: Right;    Medications: Reviewed  Allergies: No Known Allergies  Family history: Her mother had breast cancer, a sister had breast cancer twice, her daughter had melanoma  Social History:   She lives with her husband in [place].  She works in a Armed forces training and education officer.  She does not use cigarettes-quit in 1992, she reports social alcohol use, she has been a blood  donor, no risk  factor for HIV or hepatitis.  ROS:   Positives include: Nausea, arthritis in the "feet, ankles, knees, and shoulders "  A complete ROS was otherwise negative.  Physical Exam:  Blood pressure (!) 160/75, pulse 70, temperature 98.4 F (36.9 C), temperature source Oral, resp. rate 18, height 5\' 1"  (1.549 m), weight 173 lb 11.2 oz (78.8 kg), SpO2 97 %.  HEENT: Oropharynx without visible mass, neck without mass Lungs: Clear bilaterally Cardiac: Regular rate and rhythm Abdomen: No hepatosplenomegaly, nontender, no mass  Vascular: No leg edema Lymph nodes: No cervical,  supraclavicular, axillary, or inguinal nodes Neurologic: Alert and oriented, the motor exam appears intact in the upper and lower extremities bilaterally Skin: No rash Musculoskeletal: Spine tenderness    Imaging:  As per HPI   Assessment/Plan:   1. 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 Gastritis noted on endoscopy 11/04/2018, biopsy-chronic inactive gastritis, negative for Helicobacter 3. Nausea and "heartburn"- reflux? 4. 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 Family history of multiple cancers   Disposition:   Ms. Cancio has been diagnosed with gastric cancer.  She appears to have an early stage tumor based on the staging EUS and CTs.  I discussed treatment of gastric cancer with Ms. Altieri and her family.  We discussed neoadjuvant chemotherapy.  I do not recommend neoadjuvant therapy in her case secondary to the T1 versus early T2 lesion without evidence of lymph node involvement.  She has been scheduled for surgery with Dr. Barry Dienes.  I will see her after surgery to review the pathology and recommend adjuvant therapy.  She saw the genetics also earlier today a genetic panel was submitted.  Her case will be presented at the multidisciplinary GI tumor conference.  She has been maintained on abatacept for mixed connective tissue disease.  There is no clear association of the abatacept with the development of gastric cancer.  I agree with holding immunosuppressive therapy for now.  Betsy Coder, MD  12/15/2018, 2:16 PM

## 2018-12-15 NOTE — Progress Notes (Signed)
REFERRING PROVIDER: [name], MD [address] [place], [place] [address]  PRIMARY PROVIDER:  Cari Caraway, MD  PRIMARY REASON FOR VISIT:  1. Family history of breast cancer   2. Family history of colon cancer   3. Family history of lung cancer   4. Family history of melanoma   5. Malignant neoplasm of stomach, unspecified location [facility])     HISTORY OF PRESENT ILLNESS:   Jane Howe, a 72 y.o. female, was seen for a [facility] cancer genetics consultation at the request of Dr. Barry Dienes due to a personal and family history of cancer.  Ms. Gest presents to clinic today to discuss the possibility of a hereditary predisposition to cancer, genetic testing, and to further clarify her future cancer risks, as well as potential cancer risks for family members.   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  She will be having a open partial gastrectomy on 01/01/2018.  She has a history of a basal cell carcinoma on her skin.   HORMONAL RISK FACTORS:  Menarche was at age 69.  First live birth at age 36.  Ovaries intact: yes.  Hysterectomy: yes.  Menopausal status: postmenopausal.  HRT use: 0 years. Colonoscopy: yes; 2012- no polyps reported, was told to have again in 5-10 years.. Mammogram within the last year: yes.   Past Medical History:  Diagnosis Date  . Basal cell carcinoma    back  . Depression   . Family history of breast cancer   . Family history of colon cancer   . Family history of lung cancer   . Family history of melanoma   . Fibromyalgia   . Gastric cancer ([place]) 12/15/2018  . GERD (gastroesophageal reflux disease)   . Hyperlipidemia   . Lupus ([place])    no active Lupus symptoms  . Mixed connective tissue disease ([place])   . Osteoarthritis    knee, hand, and feet  . Stomach cancer [facility])     Past Surgical History:  Procedure Laterality Date  . ABDOMINAL HYSTERECTOMY    .  APPENDECTOMY    . BREAST EXCISIONAL BIOPSY Left 07/1991  . BREAST SURGERY Left    biopsy  . CHOLECYSTECTOMY N/A 11/22/2016   Procedure: LAPAROSCOPIC CHOLECYSTECTOMY WITH INTRAOPERATIVE CHOLANGIOGRAM;  Surgeon: Armandina Gemma, MD;  [facility];  Service: General;  Laterality: N/A;  . TONSILLECTOMY    . TOTAL KNEE ARTHROPLASTY Right 07/16/2016   Procedure: RIGHT TOTAL KNEE ARTHROPLASTY;  Surgeon: Gaynelle Arabian, MD;  [facility];  Service: Orthopedics;  Laterality: Right;    Social History   Socioeconomic History  . Marital status: Married    Spouse name: Not on file  . Number of children: Not on file  . Years of education: Not on file  . Highest education level: Not on file  Occupational History  . Not on file  Social Needs  . Financial resource strain: Not on file  . Food insecurity:    Worry: Not on file    Inability: Not on file  . Transportation needs:    Medical: Not on file    Non-medical: Not on file  Tobacco Use  . Smoking status: Former Smoker    Types: Cigarettes    Last attempt to quit: 04/18/1991    Years since quitting: 27.6  . Smokeless tobacco: Never Used  Substance and Sexual Activity  . Alcohol use: Yes    Comment: 0-1  mixed drink week  . Drug use: No  . Sexual activity: Not on file  Lifestyle  . Physical activity:    Days per week: Not on file    Minutes per session: Not on file  . Stress: Not on file  Relationships  . Social connections:    Talks on phone: Not on file    Gets together: Not on file    Attends religious service: Not on file    Active member of club or organization: Not on file    Attends meetings of clubs or organizations: Not on file    Relationship status: Not on file  Other Topics Concern  . Not on file  Social History Narrative  . Not on file     FAMILY HISTORY:  We obtained a detailed, 4-generation family history.  Significant diagnoses are listed below: Family History  Problem Relation Age of Onset  . Breast  cancer Mother 33       used tamoxifen, thinks it was ductal but not sure  . Breast cancer Sister 21       again, triple neg at age 66.  GT neg 'breast genes only'  . Lung cancer Paternal Uncle 29  . Stroke Maternal Grandmother   . Heart disease Maternal Grandfather   . Stroke Paternal Grandmother   . Breast cancer Cousin 10  . Colon cancer Cousin 81  . Melanoma Daughter 32  . Squamous cell carcinoma Daughter   . Breast cancer Cousin 30  . Ovarian cancer Cousin   . Breast cancer Maternal Aunt        dx >50  . Hodgkin's lymphoma Maternal Aunt     Ms. Peoples has 2 daughters ages 81 and 67.  Her oldest daughter had melanoma at 39 and 1 SCC.  She has 4 grandchildren.   Ms. Dai has 1 sister who is 64 and has a history of 2 breast cancers.  The first was dx at 43.  She 2nd one was dx 1-2 years ago and was triple negative.  She had genetic testing at that time that was negative, but only had 'breast genes'.   Ms. Sitzer father: died at 107 in motor vehicle accident.  Paternal Aunts/Uncles: 2 paternal uncles, 1 had lung cancer in ihs 9's, hx of smoking.  Paternal cousins: no hx of cancer. Paternal grandfather: died at 36 due to age-related disease.  Paternal grandmother:died at 32 due to stroke.   Ms. Woodhead mother: deceased, hx of breast cancer dx at 49, she had tamoxifen, she thinks it was ductal but is not sure.  Maternal Aunts/Uncles: 1 maternal aunt, 2 maternal uncles, no hx of cancer.  Maternal cousins: 1 maternal cousin had colon cancer at 61, another had breast caner in her 79's.  Maternal grandfather: died at 57 due to heart disease.  Maternal grandmother:died at 56, no hx of cancer.  She had 9 siblings, 1 sister had hodgkin's lymphoma, 1 sister had breast cancer dx >50.  This grandmother had a niece who had breast and ovarian cancer and died I her 41's and a niece who had many different  Cancers, 1 was lung.    risks. Patient's maternal ancestors are of N. European descent, and  paternal ancestors are of N. European descent. There is no reported Ashkenazi Jewish ancestry. There is no known consanguinity.  GENETIC COUNSELING ASSESSMENT: MARIYAM REMINGTON is a 72 y.o. female with a personal and family history which is somewhat suggestive of a Hereditary Cancer Predisposition  Syndrome. We, therefore, discussed and recommended the following at today's visit.   DISCUSSION: We reviewed the characteristics, features and inheritance patterns of hereditary cancer syndromes. We also discussed genetic testing, including the appropriate family members to test, the process of testing, insurance coverage and turn-around-time for results. We discussed the implications of a negative, positive and/or variant of uncertain significant result. We recommended Ms. Sargeant pursue genetic testing for the STAT gene panel with reflex to Multi-Cancer Panel.   The Multi-Cancer Panel offered by [facility] includes sequencing and/or deletion duplication testing of the following 91 genes: AIP, ALK, APC, ATM, AXIN2, BAP1, BARD1, BLM, BMPR1A, BRCA1, BRCA2, BRIP1, BUB1B, CASR, CDC73, CDH1, CDK4, CDKN1B, CDKN1C, CDKN2A, CEBPA, CEP57, CHEK2, CTNNA1, DICER1, DIS3L2, EGFR, ENG, EPCAM, FH, FLCN, GALNT12, GATA2, GPC3, GREM1, HOXB13, HRAS, KIT, MAX, MEN1, MET, MITF, MLH1, MLH3, MSH2, MSH3, MSH6, MUTYH, NBN, NF1, NF2, NTHL1, PALB2, PDGFRA, PHOX2B, PMS2, POLD1, POLE, POT1, PRKAR1A, PTCH1, PTEN, RAD50, RAD51C, RAD51D, RB1, RECQL4, RET, RNF43, RPS20, RUNX1, SDHA, SDHAF2, SDHB, SDHC, SDHD, SMAD4, SMARCA4, SMARCB1, SMARCE1, STK11, SUFU, TERC, TERT, TMEM127, TP53, TSC1, TSC2, VHL, WRN, WT1  We discussed that only 5-10% of cancers are associated with a Hereditary cancer predisposition syndrome.    One of the most common hereditary cancer syndromes that increases breast cancer risk is called Hereditary Breast and Ovarian Cancer (HBOC) syndrome.  This syndrome is caused by mutations in the BRCA1 and BRCA2 genes.  This syndrome increases  an individual's lifetime risk to develop breast, ovarian, pancreatic, and other types of cancer.  There are also many other cancer predisposition syndromes caused by mutations in several other genes. (for example, we pointed out Lynch Syndrome and CDH1 mutations).   We discussed that if she is found to have a mutation in one of these genes, it may impact future medical management recommendations such as increased cancer screenings and consideration of risk reducing surgeries.  A positive result could also have implications for the patient's family members.  A Negative result would mean we were unable to identify a hereditary component to her cancer, but does not rule out the possibility of a hereditary basis for her cancer.  There could be mutations that are undetectable by current technology, or in genes not yet tested or identified to increase cancer risk.    We discussed the potential to find a Variant of Uncertain Significance or VUS.  These are variants that have not yet been identified as pathogenic or benign, and it is unknown if this variant is associated with increased cancer risk or if this is a normal finding.  Most VUS's are reclassified to benign or likely benign.   It should not be used to make medical management decisions. With time, we suspect the lab will determine the significance of any VUS's identified if any.   Based on Ms. Vogler's personal and family history of cancer, she meets medical criteria for genetic testing. Despite that she meets criteria, she may still have an out of pocket cost. The laboratory can provide her with an estimate of her OOP cost.  she was given the contact information for the laboratory if she has further questions. .   Based on the patient's personal and family history, the statistical model (Tyrer Cusik)   Was used to estimate her risk of developing breast cancer. This estimates her lifetime risk of developing breast cancer to be approximately 12.8%. This  estimation is performed in the setting negative genetic test results.  A positive result may significantly impact this  risk assessment.  The patient's lifetime breast cancer risk is a preliminary estimate based on available information using one of several models endorsed by the [place] (ACS). The ACS recommends consideration of breast MRI screening as an adjunct to mammography for patients at high risk (defined as 20% or greater lifetime risk). A more detailed breast cancer risk assessment can be considered, if clinically indicated.     PLAN: After considering the risks, benefits, and limitations, Ms. Clouatre  provided informed consent to pursue genetic testing and the blood sample was sent to [facility] for analysis of the STAT panel with reflex to Multi-cancer panel. Preliminary results should be available within approximately 5-12 days' time, at which point they will be disclosed by telephone to Ms. Vigna, as will any additional recommendations warranted by these results. Ms. Dissinger will receive a summary of her genetic counseling visit and a copy of her results once available. This information will also be available in Epic. We encouraged Ms. Goodin to remain in contact with cancer genetics annually so that we can continuously update the family history and inform her of any changes in cancer genetics and testing that may be of benefit for her family. Ms. Gitto questions were answered to her satisfaction today. Our contact information was provided should additional questions or concerns arise.  Based on Ms. Mcintire's family history, we recommended her maternal relatives also consider genetic counseling and testing. Ms. Cope will let us know if we can be of any assistance in coordinating genetic counseling and/or testing for this family member.   Lastly, we encouraged Ms. Scalici to remain in contact with cancer genetics annually so that we can continuously update the family history  and inform her of any changes in cancer genetics and testing that may be of benefit for this family.   Ms.  Sebek questions were answered to her satisfaction today. Our contact information was provided should additional questions or concerns arise. Thank you for the referral and allowing [place] to share in the care of your patient.   Tana Felts, MS, [facility] Certified Genetic Counselor [email or web address] phone: [phone]  The patient was seen for a total of 35 minutes in face-to-face genetic counseling.  The patient was accompanied today by her daughter and husband. This patient was discussed with Drs. Magrinat, Lindi Adie and/or Burr Medico who agrees with the above.

## 2018-12-24 NOTE — Pre-Procedure Instructions (Signed)
Jane Howe  12/24/2018      [facility] [place], [place] [address] Phone: [phone] Fax: [phone]    Your procedure is scheduled on Jan. 16  Report to [facility] Admitting at 6:00 A.M.  Call this number if you have problems the morning of surgery:  [phone]   Remember:    DRINK 2 [place]   Do not eat  after midnight.                DRINK 1 CAN OF ENSURE BY 5:00 a.m. THE DAY OF SURGERY    You may drink clear liquids until 5:00.         Clear liquids allowed are:                    Water, Juice (non-citric and without pulp), Carbonated beverages, Clear Tea, Black Coffee only, Plain Jell-O only, Gatorade and Plain Popsicles only    Take these medicines the morning of surgery with A SIP OF WATER :             Cyclobenzaprine (flexeril) if needed             Duloxetine (cymbalta)             Omeprazole (prilosec) if needed             zofran if needed             Propranolol (inderal) if needed             7 days prior to surgery STOP taking any Aspirin (unless otherwise instructed by your surgeon), Aleve, Naproxen, Ibuprofen, Motrin, Advil, Goody's, BC's, all herbal medications, fish oil, and all vitamins and mobic (meloxicam).                   Do not wear jewelry, make-up or nail polish.  Do not wear lotions, powders, or perfumes, or deodorant.  Do not shave 48 hours prior to surgery.  Men may shave face and neck.  Do not bring valuables to the hospital.  [facility] is not responsible for any belongings or valuables.  Contacts, dentures or bridgework may not be worn into surgery.  Leave your suitcase in the car.  After surgery it may be brought to your room.  For patients admitted to the hospital, discharge time will be determined by your treatment team.  Patients discharged the day of surgery will not be allowed to drive home.    Name and phone number of your driver:   Special instructions: [place]- Preparing For Surgery  Before surgery, you can play an important role. Because skin is not sterile, your skin needs to be as free of germs as possible. You can reduce the number of germs on your skin by washing with CHG (chlorahexidine gluconate) Soap before surgery.  CHG is an antiseptic cleaner which kills germs and bonds with the skin to continue killing germs even after washing.    Oral Hygiene is also important to reduce your risk of infection.  Remember - BRUSH YOUR TEETH THE MORNING OF SURGERY WITH YOUR REGULAR TOOTHPASTE  Please do not use if you have an allergy to CHG or antibacterial soaps. If your skin becomes reddened/irritated stop using the CHG.  Do not shave (including legs and underarms) for at least 48 hours prior to  first CHG shower. It is OK to shave your face.  Please follow these instructions carefully.   1. Shower the NIGHT BEFORE SURGERY and the MORNING OF SURGERY with CHG.   2. If you chose to wash your hair, wash your hair first as usual with your normal shampoo.  3. After you shampoo, rinse your hair and body thoroughly to remove the shampoo.  4. Use CHG as you would any other liquid soap. You can apply CHG directly to the skin and wash gently with a scrungie or a clean washcloth.   5. Apply the CHG Soap to your body ONLY FROM THE NECK DOWN.  Do not use on open wounds or open sores. Avoid contact with your eyes, ears, mouth and genitals (private parts). Wash Face and genitals (private parts)  with your normal soap.  6. Wash thoroughly, paying special attention to the area where your surgery will be performed.  7. Thoroughly rinse your body with warm water from the neck down.  8. DO NOT shower/wash with your normal soap after using and rinsing off the CHG Soap.  9. Pat yourself dry with a CLEAN TOWEL.  10. Wear CLEAN PAJAMAS to bed the night before surgery, wear comfortable clothes  the morning of surgery  11. Place CLEAN SHEETS on your bed the night of your first shower and DO NOT SLEEP WITH PETS.    Day of Surgery:  Do not apply any deodorants/lotions.  Please wear clean clothes to the hospital/surgery center.   Remember to brush your teeth WITH YOUR REGULAR TOOTHPASTE.    Please read over the following fact sheets that you were given. Coughing and Deep Breathing and Surgical Site Infection Prevention

## 2018-12-25 ENCOUNTER — Encounter (HOSPITAL_COMMUNITY): Payer: Self-pay

## 2018-12-25 ENCOUNTER — Encounter (HOSPITAL_COMMUNITY)
Admission: RE | Admit: 2018-12-25 | Discharge: 2018-12-25 | Disposition: A | Discharge status: Home / Self Care | Payer: Medicare Other | Source: Ambulatory Visit | Attending: General Surgery | Admitting: General Surgery

## 2018-12-25 DIAGNOSIS — Z01812 Encounter for preprocedural laboratory examination: Secondary | ICD-10-CM | POA: Diagnosis not present

## 2018-12-25 HISTORY — DX: Family history of other specified conditions: Z84.89

## 2018-12-25 HISTORY — DX: Cardiac murmur, unspecified: R01.1

## 2018-12-25 LAB — CBC WITH DIFFERENTIAL/PLATELET
Abs Immature Granulocytes: 0.05 10*3/uL (ref 0.00–0.07)
Basophils Absolute: 0.1 10*3/uL (ref 0.0–0.1)
Basophils Relative: 1 %
Eosinophils Absolute: 0.2 10*3/uL (ref 0.0–0.5)
Eosinophils Relative: 2 %
HCT: 43 % (ref 36.0–46.0)
Hemoglobin: 14.3 g/dL (ref 12.0–15.0)
Immature Granulocytes: 1 %
Lymphocytes Relative: 22 %
Lymphs Abs: 1.9 10*3/uL (ref 0.7–4.0)
MCH: 29.8 pg (ref 26.0–34.0)
MCHC: 33.3 g/dL (ref 30.0–36.0)
MCV: 89.6 fL (ref 80.0–100.0)
Monocytes Absolute: 0.8 10*3/uL (ref 0.1–1.0)
Monocytes Relative: 9 %
Neutro Abs: 5.6 10*3/uL (ref 1.7–7.7)
Neutrophils Relative %: 65 %
Platelets: 238 10*3/uL (ref 150–400)
RBC: 4.8 MIL/uL (ref 3.87–5.11)
RDW: 12.4 % (ref 11.5–15.5)
WBC: 8.6 10*3/uL (ref 4.0–10.5)
nRBC: 0 % (ref 0.0–0.2)

## 2018-12-25 LAB — URINALYSIS, ROUTINE W REFLEX MICROSCOPIC
Bilirubin Urine: NEGATIVE
Glucose, UA: NEGATIVE mg/dL
Hgb urine dipstick: NEGATIVE
Ketones, ur: NEGATIVE mg/dL
Leukocytes, UA: NEGATIVE
Nitrite: NEGATIVE
Protein, ur: NEGATIVE mg/dL
Specific Gravity, Urine: 1.009 (ref 1.005–1.030)
pH: 7 (ref 5.0–8.0)

## 2018-12-25 LAB — COMPREHENSIVE METABOLIC PANEL
ALT: 18 U/L (ref 0–44)
AST: 18 U/L (ref 15–41)
Albumin: 3.7 g/dL (ref 3.5–5.0)
Alkaline Phosphatase: 64 U/L (ref 38–126)
Anion gap: 9 (ref 5–15)
BUN: 16 mg/dL (ref 8–23)
CO2: 24 mmol/L (ref 22–32)
Calcium: 9.2 mg/dL (ref 8.9–10.3)
Chloride: 103 mmol/L (ref 98–111)
Creatinine, Ser: 0.64 mg/dL (ref 0.44–1.00)
GFR calc Af Amer: >60 mL/min (ref >60)
GFR calc non Af Amer: >60 mL/min (ref >60)
Glucose, Bld: 110 mg/dL — ABNORMAL HIGH (ref 70–99)
Potassium: 4 mmol/L (ref 3.5–5.1)
Sodium: 136 mmol/L (ref 135–145)
Total Bilirubin: 0.3 mg/dL (ref 0.3–1.2)
Total Protein: 6.7 g/dL (ref 6.5–8.1)

## 2018-12-25 LAB — PROTIME-INR
INR: 0.96
Prothrombin Time: 12.7 seconds (ref 11.4–15.2)

## 2018-12-25 NOTE — Progress Notes (Addendum)
Anesthesia PAT Evaluation:  Case:  [id number] Date/Time:  01/01/19 0745   Procedures:      LAPAROSCOPY DIAGNOSTIC (N/A )     OPEN PARTIAL GASTRECTOMY WITH POSSIBLE FEEDING TUBE (N/A )   Anesthesia type:  General   Pre-op diagnosis:  gastric cancer   Location:  [facility] OR ROOM 09 / [place] OR   Surgeon:  [name], MD      DISCUSSION: Patient is a 73 year old female scheduled for the above procedure.   History includes former smoker (quit '92), lupus, fibromyalgia, HLD, skin cancer (BCC, back), GERD, murmur. She takes propranolol for tremor.  Awaiting primary care records with EKG. Will attempt to clarify with patient her mother's reaction to anesthesia. Patient denied any personal anesthesia complications/reactions.    ADDENDUM 12/26/18 2:55PM: Medical clearance received from Cari Caraway, MD. Patient seen on 12/02/18. EKG showed SB at 54. No murmur noted. Continued statin therapy for aortic atherosclerosis on CT recommended. No contraindications for planned surgery identified.  In regards to mother's reaction to anesthesia, patient reports that her mother was very anxious and in the 1960's reported that she felt like she couldn't breath after surgery and was having trouble getting the attention of the staff. Her mother, who is now deceased, when on to have subsequent joint replacement surgeries without known incident. There is NO reported history of pseudocholinesterase deficiency or malignant hyperthermia.   If no acute changes then I anticipate that she can proceed as planned.   VS: BP (!) 142/56   Pulse 71   Temp 36.9 C   Ht 5\' 1"  (1.549 m)   Wt 78.5 kg   SpO2 95%   BMI 32.71 kg/m  Patient is a pleasant Caucasian female in NAD. She denied SOB, chest pain, dizziness, and significant edema. She does not routinely exercise but is able to clean her house (ie, sweep) without CV symptoms. She reports she was told by oncology that she had a mild murmur, but no testing recommended--otherwise  she hasn't really been told she has a murmur. Reportedly was seen by her PCP in December for medical clearance with EKG showing SB. Exam today showed clear lungs, heart RRR. I could not definitively appreciate a murmur on exam. No significant ankle edema noted.  She has prominent upper teeth, but denied known history of difficult airway. Miller and 2 used to intubate 11/22/16.    PROVIDERS: Cari Caraway, MD is PCP   LABS: Labs reviewed: Acceptable for surgery. (all labs ordered are listed, but only abnormal results are displayed)  Labs Reviewed  COMPREHENSIVE METABOLIC PANEL - Abnormal; Notable for the following components:      Result Value   Glucose, Bld 110 (*)    All other components within normal limits  CBC WITH DIFFERENTIAL/PLATELET  PROTIME-INR  URINALYSIS, ROUTINE W REFLEX MICROSCOPIC    IMAGES: CT Chest/abd/pelvis 11/17/18: IMPRESSION: 1. No definite evidence to suggest metastatic disease to the chest, abdomen or pelvis. 2. The patient's reported gastric tumor is not readily identified by CT. 3. Several tiny pulmonary nodules clustered in the left lower lobe measuring 4 mm or less in size, nonspecific but statistically likely benign. Attention at time of routine follow-up cancer surveillance imaging is recommended to ensure stability. 4. Aortic atherosclerosis.   EKG: 12/02/18 [facility]): SB at 54 bpm. RSR prime in V1 (non-diagnostic).   CV: N/A  Past Medical History:  Diagnosis Date  . Basal cell carcinoma    back  . Depression   . Family  history of adverse reaction to anesthesia    mother,but doesn't know what type of reaction  . Family history of breast cancer   . Family history of colon cancer   . Family history of lung cancer   . Family history of melanoma   . Fibromyalgia   . Gastric cancer ([place]) 12/15/2018  . GERD (gastroesophageal reflux disease)   . Heart murmur   . Hyperlipidemia   . Lupus ([place])    no active Lupus symptoms  . Mixed  connective tissue disease ([place])   . Osteoarthritis    knee, hand, and feet  . Stomach cancer [facility])     Past Surgical History:  Procedure Laterality Date  . ABDOMINAL HYSTERECTOMY    . APPENDECTOMY    . BREAST EXCISIONAL BIOPSY Left 07/1991  . BREAST SURGERY Left    biopsy  . CHOLECYSTECTOMY N/A 11/22/2016   Procedure: LAPAROSCOPIC CHOLECYSTECTOMY WITH INTRAOPERATIVE CHOLANGIOGRAM;  Surgeon: Armandina Gemma, MD;  [facility];  Service: General;  Laterality: N/A;  . ROTATOR CUFF REPAIR Right 2018  . TONSILLECTOMY     and adnoids  . TOTAL KNEE ARTHROPLASTY Right 07/16/2016   Procedure: RIGHT TOTAL KNEE ARTHROPLASTY;  Surgeon: Gaynelle Arabian, MD;  [facility];  Service: Orthopedics;  Laterality: Right;    MEDICATIONS: . ALPRAZolam (XANAX) 0.25 MG tablet  . b complex vitamins tablet  . Biotin 5 MG TABS  . calcium carbonate (OSCAL) 1500 (600 Ca) MG TABS tablet  . Cholecalciferol (VITAMIN D3) 125 MCG (5000 UT) TABS  . cyclobenzaprine (FLEXERIL) 10 MG tablet  . docusate sodium (COLACE) 100 MG capsule  . DULoxetine (CYMBALTA) 30 MG capsule  . magnesium oxide (MAG-OX) 400 MG tablet  . Melatonin 5 MG TABS  . meloxicam (MOBIC) 15 MG tablet  . Multiple Vitamin (MULTIVITAMIN WITH MINERALS) TABS tablet  . omeprazole (PRILOSEC) 20 MG capsule  . ondansetron (ZOFRAN-ODT) 4 MG disintegrating tablet  . polyethylene glycol (MIRALAX / GLYCOLAX) packet  . pravastatin (PRAVACHOL) 40 MG tablet  . Probiotic Product (PROBIOTIC DAILY PO)  . Promethazine HCl (PHENERGAN PO)  . propranolol (INDERAL) 10 MG tablet  . traMADol (ULTRAM) 50 MG tablet  . traZODone (DESYREL) 100 MG tablet   No current facility-administered medications for this encounter.     Myra Gianotti, PA-C Surgical Short Stay/Anesthesiology [facility] Phone [phone] [facility] Phone [phone] 12/25/2018 5:42 PM

## 2018-12-25 NOTE — Progress Notes (Addendum)
PCP -  Cari Caraway @ [facility] Cardiologist - na  Chest x-ray - na EKG - 12/02/18--request Dr. Baldomero Lamy office Stress Test - na ECHO - na Cardiac Cath - na  Sleep Study - na CPAP - na  Fasting Blood Sugar - na Checks Blood Sugar _____ times a day  Blood Thinner Instructions:na Aspirin Instructions: na  Anesthesia review:   Patient denies shortness of breath, fever, cough and chest pain at PAT appointment   Patient verbalized understanding of instructions that were given to them at the PAT appointment. Patient was also instructed that they will need to review over the PAT instructions again at home before surgery.

## 2018-12-26 ENCOUNTER — Encounter (HOSPITAL_COMMUNITY): Payer: Self-pay

## 2018-12-26 ENCOUNTER — Telehealth: Payer: Self-pay | Admitting: Genetics

## 2018-12-26 NOTE — Telephone Encounter (Signed)
Revealed negative genetic testing.   This normal result is reassuring and indicates that it is unlikely Jane Howe's cancer is due to a hereditary cause.  It is unlikely that there is an increased risk of another cancer due to a mutation in one of these genes.  However, genetic testing is not perfect, and cannot definitively rule out a hereditary cause.  It will be important for her to keep in contact with genetics to learn if any additional testing may be needed in the future.     Still recommended maternal relatives (espeically those affected with cancer) also have genetic testing.

## 2018-12-26 NOTE — Anesthesia Preprocedure Evaluation (Addendum)
Anesthesia Evaluation   Patient awake    Reviewed: Allergy & Precautions, NPO status , Patient's Chart, lab work & pertinent test results  Airway Mallampati: II  TM Distance: >3 FB Neck ROM: Full    Dental  (+) Teeth Intact, Dental Advisory Given   Pulmonary former smoker,    breath sounds clear to auscultation       Cardiovascular  Rhythm:Regular Rate:Normal     Neuro/Psych    GI/Hepatic   Endo/Other    Renal/GU      Musculoskeletal   Abdominal   Peds  Hematology   Anesthesia Other Findings   Reproductive/Obstetrics                            Anesthesia Physical Anesthesia Plan  ASA: III  Anesthesia Plan: General   Post-op Pain Management:    Induction:   PONV Risk Score and Plan: Ondansetron and Dexamethasone  Airway Management Planned: Oral ETT  Additional Equipment:   Intra-op Plan:   Post-operative Plan: Extubation in OR  Informed Consent: I have reviewed the patients History and Physical, chart, labs and discussed the procedure including the risks, benefits and alternatives for the proposed anesthesia with the patient or authorized representative who has indicated his/her understanding and acceptance.     Dental advisory given  Plan Discussed with: CRNA and Anesthesiologist  Anesthesia Plan Comments: (PAT note written 12/26/2018 by Myra Gianotti, PA-C. )       Anesthesia Quick Evaluation

## 2018-12-29 ENCOUNTER — Ambulatory Visit: Payer: Self-pay | Admitting: Genetics

## 2018-12-29 ENCOUNTER — Encounter: Payer: Self-pay | Admitting: Genetics

## 2018-12-29 DIAGNOSIS — Z808 Family history of malignant neoplasm of other organs or systems: Secondary | ICD-10-CM

## 2018-12-29 DIAGNOSIS — C169 Malignant neoplasm of stomach, unspecified: Secondary | ICD-10-CM

## 2018-12-29 DIAGNOSIS — Z800 Family history of malignant neoplasm of digestive organs: Secondary | ICD-10-CM

## 2018-12-29 DIAGNOSIS — Z801 Family history of malignant neoplasm of trachea, bronchus and lung: Secondary | ICD-10-CM

## 2018-12-29 DIAGNOSIS — C162 Malignant neoplasm of body of stomach: Secondary | ICD-10-CM

## 2018-12-29 DIAGNOSIS — Z803 Family history of malignant neoplasm of breast: Secondary | ICD-10-CM

## 2018-12-29 DIAGNOSIS — Z1379 Encounter for other screening for genetic and chromosomal anomalies: Secondary | ICD-10-CM | POA: Insufficient documentation

## 2018-12-29 NOTE — Progress Notes (Signed)
HPI:  Ms. Langenderfer was previously seen in the [place] clinic on 12/15/2018 due to a personal and family history of cancer and concerns regarding a hereditary predisposition to cancer. Please refer to our prior cancer genetics clinic note for more information regarding Ms. Miranda's medical, social and family histories, and our assessment and recommendations, at the time. Ms. Veley recent genetic test results were disclosed to her, as well as recommendations warranted by these results. These results and recommendations are discussed in more detail below.  CANCER HISTORY:  In 2019, at the age of 73, Ms. 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  She will be having a open partial gastrectomy on 01/01/1970.  She has a history of a basal cell carcinoma on her skin.    FAMILY HISTORY:  We obtained a detailed, 4-generation family history.  Significant diagnoses are listed below: Family History  Problem Relation Age of Onset  . Breast cancer Mother 32       used tamoxifen, thinks it was ductal but not sure  . Breast cancer Sister 64       again, triple neg at age 1.  GT neg 'breast genes only'  . Lung cancer Paternal Uncle 46  . Stroke Maternal Grandmother   . Heart disease Maternal Grandfather   . Stroke Paternal Grandmother   . Breast cancer Cousin 13  . Colon cancer Cousin 59  . Melanoma Daughter 51  . Squamous cell carcinoma Daughter   . Breast cancer Cousin 30  . Ovarian cancer Cousin   . Breast cancer Maternal Aunt        dx >50  . Hodgkin's lymphoma Maternal Aunt     Ms. Osoria has 2 daughters ages 2 and 86.  Her oldest daughter had melanoma at 69 and 1 SCC.  She has 4 grandchildren.   Ms. Alfredo has 1 sister who is 36 and has a history of 2 breast cancers.  The first was dx at 58.  She 2nd one was dx 1-2 years ago and was triple negative.  She had genetic testing at that time that was negative, but only had 'breast genes'.     Ms. Beaubien father: died at 8 in motor vehicle accident.  Paternal Aunts/Uncles: 2 paternal uncles, 1 had lung cancer in ihs 44's, hx of smoking.  Paternal cousins: no hx of cancer. Paternal grandfather: died at 52 due to age-related disease.  Paternal grandmother:died at 18 due to stroke.   Ms. Kage mother: deceased, hx of breast cancer dx at 48, she had tamoxifen, she thinks it was ductal but is not sure.  Maternal Aunts/Uncles: 1 maternal aunt, 2 maternal uncles, no hx of cancer.  Maternal cousins: 1 maternal cousin had colon cancer at 35, another had breast caner in her 67's.  Maternal grandfather: died at 13 due to heart disease.  Maternal grandmother:died at 54, no hx of cancer.  She had 9 siblings, 1 sister had hodgkin's lymphoma, 1 sister had breast cancer dx >50.  This grandmother had a niece who had breast and ovarian cancer and died I her 22's and a niece who had many different  Cancers, 1 was lung.    risks. Patient's maternal ancestors are of N. European descent, and paternal ancestors are of N. European descent. There is no reported Ashkenazi Jewish ancestry. There is no known consanguinity.  GENETIC TEST RESULTS: Genetic testing performed through [facility]'s Multi-Cancer Panel reported out on 12/26/2018 showed no pathogenic mutations.  The Multi-Cancer Panel offered by [facility] includes sequencing and/or deletion duplication testing of the following 90 genes: AIP, ALK, APC, ATM, AXIN2, BAP1, BARD1, BLM, BMPR1A, BRCA1, BRCA2, BRIP1, BUB1B, CASR, CDC73, CDH1, CDK4, CDKN1B, CDKN1C, CDKN2A, CEBPA, CHEK2, CTNNA1, DICER1, DIS3L2, EGFR, ENG, EPCAM, FH, FLCN, GALNT12, GATA2, GPC3, GREM1, HOXB13, HRAS, KIT, MAX, MEN1, MET, MITF, MLH1, MLH3, MSH2, MSH3, MSH6, MUTYH, NBN, NF1, NF2, NTHL1, PALB2, PDGFRA, PHOX2B, PMS2, POLD1, POLE, POT1, PRKAR1A, PTCH1, PTEN, RAD50, RAD51C, RAD51D, RB1, RECQL4, RET, RNF43, RPS20, RUNX1, SDHA, SDHAF2, SDHB, SDHC, SDHD, SMAD4, SMARCA4, SMARCB1, SMARCE1,  STK11, SUFU, TERC, TERT, TMEM127, TP53, TSC1, TSC2, VHL, WRN, WT1.  The test report will be scanned into EPIC and will be located under the Molecular Pathology section of the Results Review tab. A portion of the result report is included below for reference.     We discussed with Ms. Bare that because current genetic testing is not perfect, it is possible there may be a gene mutation in one of these genes that current testing cannot detect, but that chance is small.  We also discussed, that there could be another gene that has not yet been discovered, or that we have not yet tested, that is responsible for the cancer diagnoses in the family. It is also possible there is a hereditary cause for the cancer in the family that Ms. Marlar did not inherit and therefore was not identified in her testing.  Therefore, it is important to remain in touch with cancer genetics in the future so that we can continue to offer Ms. Erny the most up to date genetic testing.   ADDITIONAL GENETIC TESTING: We discussed with Ms. Winbush that her genetic testing was fairly extensive.  If there are are genes identified to increase cancer risk that can be analyzed in the future, we would be happy to discuss and coordinate this testing at that time.    CANCER SCREENING RECOMMENDATIONS: Ms. Wuellner test result is negative (normal).  This means that we have not identified a hereditary cause for her personal and family history of cancer at this time.   While reassuring, this does not definitively rule out a hereditary predisposition to cancer. It is still possible that there could be genetic mutations that are undetectable by current technology, or genetic mutations in genes that have not been tested or identified to increase cancer risk.  Therefore, it is recommended she continue to follow the cancer management and screening guidelines provided by her oncology and primary healthcare provider. An individual's cancer risk is not  determined by genetic test results alone.  Overall cancer risk assessment includes additional factors such as personal medical history, family history, etc.  These should be used to make a personalized plan for cancer prevention and surveillance.    Based on the patient's personal and family history, the statistical model (Tyrer Cusik)   Was used to estimate her risk of developing breast cancer. This estimates her lifetime risk of developing breast cancer to be approximately 12.8%. The patient's lifetime breast cancer risk is a preliminary estimate based on available information using one of several models endorsed by the [place] (ACS). The ACS recommends consideration of breast MRI screening as an adjunct to mammography for patients at high risk (defined as 20% or greater lifetime risk). A more detailed breast cancer risk assessment can be considered, if clinically indicated.      RECOMMENDATIONS FOR FAMILY MEMBERS:  Relatives in this family might be at some increased risk of developing  cancer, over the general population risk, simply due to the family history of cancer.  We recommended women in this family have a yearly mammogram beginning at age 32, or 6 years younger than the earliest onset of cancer, an annual clinical breast exam, and perform monthly breast self-exams. Women in this family should also have a gynecological exam as recommended by their primary provider. All family members should have a colonoscopy by age 70 (or as directed by their doctors).  All family members should inform their physicians about the family history of cancer so their doctors can make the most appropriate screening recommendations for them.   It is also possible there is a hereditary cause for the cancer in Ms. Holcomb's family that she did not inherit and therefore was not identified in her.  Therefore, we recommended her maternal relatives (especially those affected with cancer) also have genetic  counseling and testing. Ms. Cotterman will let us know if we can be of any assistance in coordinating genetic counseling and/or testing for these family members.   FOLLOW-UP: Lastly, we discussed with Ms. Deemer that cancer genetics is a rapidly advancing field and it is possible that new genetic tests will be appropriate for her and/or her family members in the future. We encouraged her to remain in contact with cancer genetics on an annual basis so we can update her personal and family histories and let her know of advances in cancer genetics that may benefit this family.   Our contact number was provided. Ms. Barga questions were answered to her satisfaction, and she knows she is welcome to call us at anytime with additional questions or concerns.   Ferol Luz, MS, [facility] Certified Genetic Counselor [email or web address]

## 2019-01-01 ENCOUNTER — Inpatient Hospital Stay (HOSPITAL_COMMUNITY): Payer: Medicare Other | Admitting: Anesthesiology

## 2019-01-01 ENCOUNTER — Other Ambulatory Visit: Payer: Self-pay

## 2019-01-01 ENCOUNTER — Encounter (HOSPITAL_COMMUNITY): Payer: Self-pay | Admitting: Certified Registered Nurse Anesthetist

## 2019-01-01 ENCOUNTER — Encounter (HOSPITAL_COMMUNITY)
Admission: RE | Disposition: A | Discharge status: Home / Self Care | Payer: Self-pay | Source: Home / Self Care | Attending: General Surgery

## 2019-01-01 ENCOUNTER — Inpatient Hospital Stay (HOSPITAL_COMMUNITY): Payer: Medicare Other | Admitting: Vascular Surgery

## 2019-01-01 ENCOUNTER — Inpatient Hospital Stay (HOSPITAL_COMMUNITY)
Admission: RE | Admit: 2019-01-01 | Discharge: 2019-01-08 | DRG: 328 | Disposition: A | Discharge status: Home / Self Care | Payer: Medicare Other | Attending: General Surgery | Admitting: General Surgery

## 2019-01-01 DIAGNOSIS — F17200 Nicotine dependence, unspecified, uncomplicated: Secondary | ICD-10-CM | POA: Diagnosis present

## 2019-01-01 DIAGNOSIS — C169 Malignant neoplasm of stomach, unspecified: Secondary | ICD-10-CM | POA: Diagnosis not present

## 2019-01-01 DIAGNOSIS — Z808 Family history of malignant neoplasm of other organs or systems: Secondary | ICD-10-CM | POA: Diagnosis not present

## 2019-01-01 DIAGNOSIS — Z90711 Acquired absence of uterus with remaining cervical stump: Secondary | ICD-10-CM | POA: Diagnosis not present

## 2019-01-01 DIAGNOSIS — Z803 Family history of malignant neoplasm of breast: Secondary | ICD-10-CM

## 2019-01-01 DIAGNOSIS — M069 Rheumatoid arthritis, unspecified: Secondary | ICD-10-CM | POA: Diagnosis present

## 2019-01-01 DIAGNOSIS — C163 Malignant neoplasm of pyloric antrum: Secondary | ICD-10-CM | POA: Diagnosis present

## 2019-01-01 DIAGNOSIS — Z801 Family history of malignant neoplasm of trachea, bronchus and lung: Secondary | ICD-10-CM | POA: Diagnosis not present

## 2019-01-01 DIAGNOSIS — Z800 Family history of malignant neoplasm of digestive organs: Secondary | ICD-10-CM | POA: Diagnosis not present

## 2019-01-01 DIAGNOSIS — C162 Malignant neoplasm of body of stomach: Secondary | ICD-10-CM | POA: Diagnosis not present

## 2019-01-01 HISTORY — PX: LAPAROSCOPY: SHX197

## 2019-01-01 HISTORY — PX: GASTRECTOMY: SHX58

## 2019-01-01 LAB — CBC
HCT: 40.1 % (ref 36.0–46.0)
Hemoglobin: 13.3 g/dL (ref 12.0–15.0)
MCH: 30.2 pg (ref 26.0–34.0)
MCHC: 33.2 g/dL (ref 30.0–36.0)
MCV: 90.9 fL (ref 80.0–100.0)
Platelets: 242 10*3/uL (ref 150–400)
RBC: 4.41 MIL/uL (ref 3.87–5.11)
RDW: 12.9 % (ref 11.5–15.5)
WBC: 20.2 10*3/uL — ABNORMAL HIGH (ref 4.0–10.5)
nRBC: 0 % (ref 0.0–0.2)

## 2019-01-01 LAB — CREATININE, SERUM
Creatinine, Ser: 1.04 mg/dL — ABNORMAL HIGH (ref 0.44–1.00)
GFR calc Af Amer: >60 mL/min (ref >60)
GFR calc non Af Amer: 54 mL/min — ABNORMAL LOW (ref >60)

## 2019-01-01 LAB — TYPE AND SCREEN
ABO/RH(D): B POS
Antibody Screen: NEGATIVE

## 2019-01-01 LAB — ABO/RH: ABO/RH(D): B POS

## 2019-01-01 SURGERY — LAPAROSCOPY, DIAGNOSTIC
Anesthesia: General | Site: Abdomen

## 2019-01-01 MED ORDER — PANTOPRAZOLE SODIUM 40 MG IV SOLR
40.0000 mg | INTRAVENOUS | Status: DC
  Administered 2019-01-01 – 2019-01-06 (×6): 40 mg via INTRAVENOUS
  Filled 2019-01-01 (×6): qty 40

## 2019-01-01 MED ORDER — CHLORHEXIDINE GLUCONATE CLOTH 2 % EX PADS: 6.0000 | MEDICATED_PAD | Freq: Once | CUTANEOUS | Status: DC

## 2019-01-01 MED ORDER — PHENYLEPHRINE 40 MCG/ML (10ML) SYRINGE FOR IV PUSH (FOR BLOOD PRESSURE SUPPORT)
PREFILLED_SYRINGE | INTRAVENOUS | Status: AC
  Filled 2019-01-01: qty 10

## 2019-01-01 MED ORDER — LIDOCAINE 2% (20 MG/ML) 5 ML SYRINGE
INTRAMUSCULAR | Status: AC
  Filled 2019-01-01: qty 5

## 2019-01-01 MED ORDER — LIDOCAINE 2% (20 MG/ML) 5 ML SYRINGE
INTRAMUSCULAR | Status: DC | PRN
  Administered 2019-01-01: 50 mg via INTRAVENOUS

## 2019-01-01 MED ORDER — DIPHENHYDRAMINE HCL 50 MG/ML IJ SOLN: 12.5000 mg | Freq: Four times a day (QID) | INTRAMUSCULAR | Status: DC | PRN

## 2019-01-01 MED ORDER — ONDANSETRON HCL 4 MG/2ML IJ SOLN
4.0000 mg | Freq: Four times a day (QID) | INTRAMUSCULAR | Status: DC | PRN
  Administered 2019-01-04 – 2019-01-07 (×2): 4 mg via INTRAVENOUS
  Filled 2019-01-01 (×3): qty 2

## 2019-01-01 MED ORDER — SODIUM CHLORIDE 0.9% FLUSH: 9.0000 mL | INTRAVENOUS | Status: DC | PRN

## 2019-01-01 MED ORDER — LIDOCAINE IN D5W 4-5 MG/ML-% IV SOLN
INTRAVENOUS | Status: DC | PRN
  Administered 2019-01-01: 25 ug/kg/min via INTRAVENOUS

## 2019-01-01 MED ORDER — KCL IN DEXTROSE-NACL 20-5-0.45 MEQ/L-%-% IV SOLN
INTRAVENOUS | Status: DC
  Administered 2019-01-01 – 2019-01-02 (×2): via INTRAVENOUS
  Administered 2019-01-02: 1 mL via INTRAVENOUS
  Administered 2019-01-03 – 2019-01-05 (×6): via INTRAVENOUS
  Filled 2019-01-01 (×12): qty 1000

## 2019-01-01 MED ORDER — STERILE WATER FOR IRRIGATION IR SOLN
Status: DC | PRN
  Administered 2019-01-01: 1000 mL

## 2019-01-01 MED ORDER — MIDAZOLAM HCL 2 MG/2ML IJ SOLN
INTRAMUSCULAR | Status: AC
  Filled 2019-01-01: qty 2

## 2019-01-01 MED ORDER — ENSURE PRE-SURGERY PO LIQD
592.0000 mL | Freq: Once | ORAL | Status: DC
  Filled 2019-01-01: qty 592

## 2019-01-01 MED ORDER — LIDOCAINE HCL (PF) 1 % IJ SOLN
INTRAMUSCULAR | Status: AC
  Filled 2019-01-01: qty 30

## 2019-01-01 MED ORDER — PROPOFOL 10 MG/ML IV BOLUS
INTRAVENOUS | Status: AC
  Filled 2019-01-01: qty 40

## 2019-01-01 MED ORDER — ROCURONIUM BROMIDE 50 MG/5ML IV SOSY
PREFILLED_SYRINGE | INTRAVENOUS | Status: AC
  Filled 2019-01-01: qty 5

## 2019-01-01 MED ORDER — ONDANSETRON HCL 4 MG/2ML IJ SOLN: 4.0000 mg | Freq: Once | INTRAMUSCULAR | Status: DC | PRN

## 2019-01-01 MED ORDER — FENTANYL CITRATE (PF) 100 MCG/2ML IJ SOLN
INTRAMUSCULAR | Status: AC
  Filled 2019-01-01: qty 2

## 2019-01-01 MED ORDER — 0.9 % SODIUM CHLORIDE (POUR BTL) OPTIME
TOPICAL | Status: DC | PRN
  Administered 2019-01-01 (×2): 1000 mL

## 2019-01-01 MED ORDER — FENTANYL CITRATE (PF) 100 MCG/2ML IJ SOLN
25.0000 ug | INTRAMUSCULAR | Status: DC | PRN
  Administered 2019-01-01 (×3): 25 ug via INTRAVENOUS

## 2019-01-01 MED ORDER — BUPIVACAINE LIPOSOME 1.3 % IJ SUSP
20.0000 mL | INTRAMUSCULAR | Status: DC
  Filled 2019-01-01: qty 20

## 2019-01-01 MED ORDER — DEXAMETHASONE SODIUM PHOSPHATE 10 MG/ML IJ SOLN
INTRAMUSCULAR | Status: AC
  Filled 2019-01-01: qty 1

## 2019-01-01 MED ORDER — ROCURONIUM BROMIDE 50 MG/5ML IV SOSY
PREFILLED_SYRINGE | INTRAVENOUS | Status: DC | PRN
  Administered 2019-01-01: 10 mg via INTRAVENOUS
  Administered 2019-01-01: 50 mg via INTRAVENOUS
  Administered 2019-01-01: 20 mg via INTRAVENOUS

## 2019-01-01 MED ORDER — LIDOCAINE IN D5W 4-5 MG/ML-% IV SOLN
1.0000 mg/min | INTRAVENOUS | Status: DC
  Filled 2019-01-01: qty 500

## 2019-01-01 MED ORDER — FENTANYL CITRATE (PF) 250 MCG/5ML IJ SOLN
INTRAMUSCULAR | Status: AC
  Filled 2019-01-01: qty 5

## 2019-01-01 MED ORDER — MIDAZOLAM HCL 2 MG/2ML IJ SOLN
INTRAMUSCULAR | Status: DC | PRN
  Administered 2019-01-01: 2 mg via INTRAVENOUS

## 2019-01-01 MED ORDER — ACETAMINOPHEN 325 MG PO TABS: 650.0000 mg | ORAL_TABLET | Freq: Four times a day (QID) | ORAL | Status: DC | PRN

## 2019-01-01 MED ORDER — PROPOFOL 10 MG/ML IV BOLUS
INTRAVENOUS | Status: DC | PRN
  Administered 2019-01-01: 130 mg via INTRAVENOUS

## 2019-01-01 MED ORDER — LACTATED RINGERS IV SOLN
INTRAVENOUS | Status: DC
  Administered 2019-01-01 (×2): via INTRAVENOUS

## 2019-01-01 MED ORDER — ACETAMINOPHEN 500 MG PO TABS
1000.0000 mg | ORAL_TABLET | ORAL | Status: AC
  Administered 2019-01-01: 1000 mg via ORAL
  Filled 2019-01-01: qty 2

## 2019-01-01 MED ORDER — SCOPOLAMINE 1 MG/3DAYS TD PT72
1.0000 | MEDICATED_PATCH | TRANSDERMAL | Status: DC
  Administered 2019-01-01: 1.5 mg via TRANSDERMAL
  Filled 2019-01-01: qty 1

## 2019-01-01 MED ORDER — GLYCOPYRROLATE PF 0.2 MG/ML IJ SOSY
PREFILLED_SYRINGE | INTRAMUSCULAR | Status: AC
  Filled 2019-01-01: qty 1

## 2019-01-01 MED ORDER — METHOCARBAMOL 1000 MG/10ML IJ SOLN
500.0000 mg | Freq: Four times a day (QID) | INTRAVENOUS | Status: DC | PRN
  Administered 2019-01-03: 500 mg via INTRAVENOUS
  Filled 2019-01-01: qty 5
  Filled 2019-01-01: qty 500
  Filled 2019-01-01: qty 5

## 2019-01-01 MED ORDER — PROCHLORPERAZINE MALEATE 10 MG PO TABS
10.0000 mg | ORAL_TABLET | Freq: Four times a day (QID) | ORAL | Status: DC | PRN
  Filled 2019-01-01: qty 1

## 2019-01-01 MED ORDER — PROCHLORPERAZINE EDISYLATE 10 MG/2ML IJ SOLN: 5.0000 mg | Freq: Four times a day (QID) | INTRAMUSCULAR | Status: DC | PRN

## 2019-01-01 MED ORDER — ONDANSETRON HCL 4 MG/2ML IJ SOLN
INTRAMUSCULAR | Status: AC
  Filled 2019-01-01: qty 2

## 2019-01-01 MED ORDER — KETAMINE HCL 50 MG/5ML IJ SOSY
PREFILLED_SYRINGE | INTRAMUSCULAR | Status: AC
  Filled 2019-01-01: qty 5

## 2019-01-01 MED ORDER — SODIUM CHLORIDE 0.9 % IV SOLN
INTRAVENOUS | Status: DC | PRN
  Administered 2019-01-01: 20 ug/min via INTRAVENOUS

## 2019-01-01 MED ORDER — ENOXAPARIN SODIUM 40 MG/0.4ML ~~LOC~~ SOLN
40.0000 mg | SUBCUTANEOUS | Status: DC
  Administered 2019-01-02 – 2019-01-08 (×8): 40 mg via SUBCUTANEOUS
  Filled 2019-01-01 (×7): qty 0.4

## 2019-01-01 MED ORDER — BUPIVACAINE 0.25 % ON-Q PUMP DUAL CATH 300 ML
300.0000 mL | INJECTION | Status: DC
  Administered 2019-01-01: 300 mL
  Filled 2019-01-01: qty 300

## 2019-01-01 MED ORDER — ACETAMINOPHEN 10 MG/ML IV SOLN
1000.0000 mg | Freq: Four times a day (QID) | INTRAVENOUS | Status: AC
  Administered 2019-01-01 – 2019-01-02 (×4): 1000 mg via INTRAVENOUS
  Filled 2019-01-01 (×4): qty 100

## 2019-01-01 MED ORDER — KETAMINE HCL 10 MG/ML IJ SOLN
INTRAMUSCULAR | Status: DC | PRN
  Administered 2019-01-01: 25 mg via INTRAVENOUS

## 2019-01-01 MED ORDER — SUGAMMADEX SODIUM 200 MG/2ML IV SOLN
INTRAVENOUS | Status: DC | PRN
  Administered 2019-01-01: 200 mg via INTRAVENOUS

## 2019-01-01 MED ORDER — ACETAMINOPHEN 650 MG RE SUPP: 650.0000 mg | Freq: Four times a day (QID) | RECTAL | Status: DC | PRN

## 2019-01-01 MED ORDER — GABAPENTIN 300 MG PO CAPS
300.0000 mg | ORAL_CAPSULE | ORAL | Status: AC
  Administered 2019-01-01: 300 mg via ORAL
  Filled 2019-01-01: qty 1

## 2019-01-01 MED ORDER — BUPIVACAINE-EPINEPHRINE (PF) 0.25% -1:200000 IJ SOLN
INTRAMUSCULAR | Status: AC
  Filled 2019-01-01: qty 30

## 2019-01-01 MED ORDER — PHENYLEPHRINE 40 MCG/ML (10ML) SYRINGE FOR IV PUSH (FOR BLOOD PRESSURE SUPPORT)
PREFILLED_SYRINGE | INTRAVENOUS | Status: DC | PRN
  Administered 2019-01-01 (×5): 80 ug via INTRAVENOUS

## 2019-01-01 MED ORDER — DEXAMETHASONE SODIUM PHOSPHATE 10 MG/ML IJ SOLN
INTRAMUSCULAR | Status: DC | PRN
  Administered 2019-01-01: 10 mg via INTRAVENOUS

## 2019-01-01 MED ORDER — BUPIVACAINE ON-Q PAIN PUMP (FOR ORDER SET NO CHG)
INJECTION | Status: AC
  Filled 2019-01-01: qty 1

## 2019-01-01 MED ORDER — CEFAZOLIN SODIUM-DEXTROSE 2-4 GM/100ML-% IV SOLN
2.0000 g | INTRAVENOUS | Status: AC
  Administered 2019-01-01: 2 g via INTRAVENOUS
  Filled 2019-01-01: qty 100

## 2019-01-01 MED ORDER — CEFAZOLIN SODIUM-DEXTROSE 2-4 GM/100ML-% IV SOLN
2.0000 g | Freq: Three times a day (TID) | INTRAVENOUS | Status: AC
  Administered 2019-01-01: 2 g via INTRAVENOUS
  Filled 2019-01-01: qty 100

## 2019-01-01 MED ORDER — DIPHENHYDRAMINE HCL 50 MG/ML IJ SOLN
12.5000 mg | Freq: Four times a day (QID) | INTRAMUSCULAR | Status: DC | PRN
  Administered 2019-01-02 – 2019-01-04 (×3): 12.5 mg via INTRAVENOUS
  Filled 2019-01-01 (×3): qty 1

## 2019-01-01 MED ORDER — METOPROLOL TARTRATE 5 MG/5ML IV SOLN: 5.0000 mg | Freq: Four times a day (QID) | INTRAVENOUS | Status: DC | PRN

## 2019-01-01 MED ORDER — LACTATED RINGERS IV SOLN
INTRAVENOUS | Status: DC | PRN
  Administered 2019-01-01: 07:00:00 via INTRAVENOUS

## 2019-01-01 MED ORDER — ENSURE PRE-SURGERY PO LIQD
296.0000 mL | Freq: Once | ORAL | Status: DC
  Filled 2019-01-01: qty 296

## 2019-01-01 MED ORDER — DIPHENHYDRAMINE HCL 12.5 MG/5ML PO ELIX: 12.5000 mg | ORAL_SOLUTION | Freq: Four times a day (QID) | ORAL | Status: DC | PRN

## 2019-01-01 MED ORDER — MORPHINE SULFATE 2 MG/ML IV SOLN
INTRAVENOUS | Status: DC
  Administered 2019-01-01: 1 mg via INTRAVENOUS
  Administered 2019-01-01: 8 mg via INTRAVENOUS
  Administered 2019-01-02: 0 mg via INTRAVENOUS
  Administered 2019-01-02: 7 mg via INTRAVENOUS
  Administered 2019-01-02: 9 mg via INTRAVENOUS
  Administered 2019-01-02: 0 mg via INTRAVENOUS
  Administered 2019-01-02: 11 mg via INTRAVENOUS
  Administered 2019-01-02: 1 mg via INTRAVENOUS
  Administered 2019-01-03: 2 mL via INTRAVENOUS
  Administered 2019-01-03: 2 mg via INTRAVENOUS
  Administered 2019-01-03: 6 mg via INTRAVENOUS
  Administered 2019-01-03: 1 mg via INTRAVENOUS
  Administered 2019-01-03: 6 mg via INTRAVENOUS
  Administered 2019-01-04: 5 mg via INTRAVENOUS
  Administered 2019-01-04 (×2): 2 mg via INTRAVENOUS
  Administered 2019-01-05 (×3): 1 mg via INTRAVENOUS
  Administered 2019-01-05: 0 mg via INTRAVENOUS
  Filled 2019-01-01 (×3): qty 30

## 2019-01-01 MED ORDER — OXYCODONE HCL 5 MG/5ML PO SOLN: 5.0000 mg | Freq: Once | ORAL | Status: DC | PRN

## 2019-01-01 MED ORDER — NALOXONE HCL 0.4 MG/ML IJ SOLN
0.4000 mg | INTRAMUSCULAR | Status: DC | PRN
  Filled 2019-01-01: qty 1

## 2019-01-01 MED ORDER — OXYCODONE HCL 5 MG PO TABS: 5.0000 mg | ORAL_TABLET | Freq: Once | ORAL | Status: DC | PRN

## 2019-01-01 MED ORDER — ONDANSETRON HCL 4 MG/2ML IJ SOLN
INTRAMUSCULAR | Status: DC | PRN
  Administered 2019-01-01 (×2): 4 mg via INTRAVENOUS

## 2019-01-01 MED ORDER — FENTANYL CITRATE (PF) 100 MCG/2ML IJ SOLN
INTRAMUSCULAR | Status: DC | PRN
  Administered 2019-01-01 (×7): 50 ug via INTRAVENOUS

## 2019-01-01 MED ORDER — GLYCOPYRROLATE PF 0.2 MG/ML IJ SOSY
PREFILLED_SYRINGE | INTRAMUSCULAR | Status: DC | PRN
  Administered 2019-01-01: .2 mg via INTRAVENOUS

## 2019-01-01 SURGICAL SUPPLY — 77 items
ADH SKN CLS APL DERMABOND .7 (GAUZE/BANDAGES/DRESSINGS) ×1
BIOPATCH RED 1 DISK 7.0 (GAUZE/BANDAGES/DRESSINGS) IMPLANT
BLADE CLIPPER SURG (BLADE) IMPLANT
CANISTER SUCT 3000ML PPV (MISCELLANEOUS) ×2 IMPLANT
CATH KIT ON-Q SILVERSOAK 7.5 (CATHETERS) IMPLANT
CATH KIT ON-Q SILVERSOAK 7.5IN (CATHETERS) ×4 IMPLANT
CHLORAPREP W/TINT 26ML (MISCELLANEOUS) ×2 IMPLANT
CLIP VESOCCLUDE LG 6/CT (CLIP) IMPLANT
CLIP VESOCCLUDE MED 24/CT (CLIP) IMPLANT
CLIP VESOLOCK LG 6/CT PURPLE (CLIP) IMPLANT
CLIP VESOLOCK MED 6/CT (CLIP) IMPLANT
CLIP VESOLOCK MED LG 6/CT (CLIP) IMPLANT
COVER SURGICAL LIGHT HANDLE (MISCELLANEOUS) ×2 IMPLANT
COVER WAND RF STERILE (DRAPES) ×2 IMPLANT
DECANTER SPIKE VIAL GLASS SM (MISCELLANEOUS) ×4 IMPLANT
DERMABOND ADVANCED (GAUZE/BANDAGES/DRESSINGS) ×1
DERMABOND ADVANCED .7 DNX12 (GAUZE/BANDAGES/DRESSINGS) ×1 IMPLANT
DRAIN CHANNEL 19F RND (DRAIN) IMPLANT
DRAIN PENROSE 1/2X36 STERILE (WOUND CARE) IMPLANT
DRAPE LAPAROSCOPIC ABDOMINAL (DRAPES) ×2 IMPLANT
DRAPE UTILITY XL STRL (DRAPES) IMPLANT
DRAPE WARM FLUID 44X44 (DRAPE) ×2 IMPLANT
DRSG COVADERM 4X10 (GAUZE/BANDAGES/DRESSINGS) IMPLANT
DRSG COVADERM 4X8 (GAUZE/BANDAGES/DRESSINGS) IMPLANT
DRSG TEGADERM 4X4.75 (GAUZE/BANDAGES/DRESSINGS) IMPLANT
ELECT BLADE 6.5 EXT (BLADE) ×2 IMPLANT
ELECT REM PT RETURN 9FT ADLT (ELECTROSURGICAL) ×2
ELECTRODE REM PT RTRN 9FT ADLT (ELECTROSURGICAL) ×1 IMPLANT
EVACUATOR SILICONE 100CC (DRAIN) IMPLANT
GAUZE SPONGE 4X4 12PLY STRL (GAUZE/BANDAGES/DRESSINGS) ×2 IMPLANT
GLOVE BIO SURGEON STRL SZ 6 (GLOVE) ×2 IMPLANT
GLOVE INDICATOR 6.5 STRL GRN (GLOVE) ×2 IMPLANT
GOWN STRL REUS W/ TWL LRG LVL3 (GOWN DISPOSABLE) ×2 IMPLANT
GOWN STRL REUS W/TWL 2XL LVL3 (GOWN DISPOSABLE) ×4 IMPLANT
GOWN STRL REUS W/TWL LRG LVL3 (GOWN DISPOSABLE) ×4
KIT BASIN OR (CUSTOM PROCEDURE TRAY) ×2 IMPLANT
KIT TUBE JEJUNAL 16FR (CATHETERS) IMPLANT
KIT TURNOVER KIT B (KITS) ×2 IMPLANT
L-HOOK LAP DISP 36CM (ELECTROSURGICAL) ×2
LHOOK LAP DISP 36CM (ELECTROSURGICAL) ×1 IMPLANT
NS IRRIG 1000ML POUR BTL (IV SOLUTION) ×4 IMPLANT
PACK GENERAL/GYN (CUSTOM PROCEDURE TRAY) ×2 IMPLANT
PAD ARMBOARD 7.5X6 YLW CONV (MISCELLANEOUS) ×4 IMPLANT
PENCIL BUTTON HOLSTER BLD 10FT (ELECTRODE) ×2 IMPLANT
PENCIL SMOKE EVACUATOR (MISCELLANEOUS) ×2 IMPLANT
RELOAD PROXIMATE 75MM BLUE (ENDOMECHANICALS) ×4 IMPLANT
RELOAD STAPLE 75 3.8 BLU REG (ENDOMECHANICALS) IMPLANT
RELOAD STAPLER LINEAR PROX 30 (STAPLE) ×1 IMPLANT
SCISSORS LAP 5X35 DISP (ENDOMECHANICALS) IMPLANT
SET TUBE SMOKE EVAC HIGH FLOW (TUBING) ×2 IMPLANT
SHEARS FOC LG CVD HARMONIC 17C (MISCELLANEOUS) ×1 IMPLANT
SLEEVE ENDOPATH XCEL 5M (ENDOMECHANICALS) ×2 IMPLANT
SPECIMEN JAR LARGE (MISCELLANEOUS) ×2 IMPLANT
SPONGE LAP 18X18 X RAY DECT (DISPOSABLE) ×3 IMPLANT
STAPLE ECHEON FLEX 60 POW ENDO (STAPLE) IMPLANT
STAPLER PROXIMATE 75MM BLUE (STAPLE) ×1 IMPLANT
STAPLER RELOAD LINEAR PROX 30 (STAPLE) ×2
STAPLER RELOADABLE 30 BLU REG (STAPLE) IMPLANT
STAPLER VISISTAT 35W (STAPLE) ×2 IMPLANT
SUT ETHILON 2 0 FS 18 (SUTURE) IMPLANT
SUT MNCRL AB 4-0 PS2 18 (SUTURE) ×1 IMPLANT
SUT PDS AB 1 TP1 96 (SUTURE) ×4 IMPLANT
SUT PDS AB 3-0 SH 27 (SUTURE) ×4 IMPLANT
SUT SILK 2 0 SH CR/8 (SUTURE) ×2 IMPLANT
SUT SILK 2 0 TIES 10X30 (SUTURE) ×2 IMPLANT
SUT SILK 3 0 SH CR/8 (SUTURE) ×2 IMPLANT
SUT SILK 3 0 TIES 10X30 (SUTURE) ×2 IMPLANT
TOWEL GREEN STERILE FF (TOWEL DISPOSABLE) ×2 IMPLANT
TOWEL OR 17X24 6PK STRL BLUE (TOWEL DISPOSABLE) ×2 IMPLANT
TOWEL OR 17X26 10 PK STRL BLUE (TOWEL DISPOSABLE) ×2 IMPLANT
TRAY FOLEY CATH 16FRSI W/METER (SET/KITS/TRAYS/PACK) ×2 IMPLANT
TRAY LAPAROSCOPIC MC (CUSTOM PROCEDURE TRAY) ×2 IMPLANT
TROCAR XCEL BLUNT TIP 100MML (ENDOMECHANICALS) IMPLANT
TROCAR XCEL NON-BLD 11X100MML (ENDOMECHANICALS) IMPLANT
TROCAR XCEL NON-BLD 5MMX100MML (ENDOMECHANICALS) ×2 IMPLANT
TUNNELER SHEATH ON-Q 16GX12 DP (PAIN MANAGEMENT) ×1 IMPLANT
YANKAUER SUCT BULB TIP NO VENT (SUCTIONS) IMPLANT

## 2019-01-01 NOTE — Anesthesia Procedure Notes (Signed)
Procedure Name: Intubation Date/Time: 01/01/2019 8:25 AM Performed by: Genelle Bal, CRNA Pre-anesthesia Checklist: Patient identified, Emergency Drugs available, Suction available and Patient being monitored Patient Re-evaluated:Patient Re-evaluated prior to induction Oxygen Delivery Method: Circle system utilized Preoxygenation: Pre-oxygenation with 100% oxygen Induction Type: IV induction Ventilation: Mask ventilation without difficulty and Oral airway inserted - appropriate to patient size Laryngoscope Size: Sabra Heck and 2 Grade View: Grade I Tube type: Oral Tube size: 7.0 mm Number of attempts: 1 Airway Equipment and Method: Stylet and Oral airway Placement Confirmation: ETT inserted through vocal cords under direct vision,  positive ETCO2 and breath sounds checked- equal and bilateral Secured at: 21 cm Tube secured with: Tape Dental Injury: Teeth and Oropharynx as per pre-operative assessment

## 2019-01-01 NOTE — H&P (Signed)
Jane Howe Location: [facility] Surgery Patient #: [id number] DOB: Jan 10, 1946 Married / Language: English / Race: White Female   History of Present Illness The patient is a 73 year old female who presents with gastric cancer. Patient is a lovely 73 year old female who is referred by Dr. Michail Sermon for a new diagnosis of gastric cancer. The patient presented with increased heartburn and reflux. He underwent an EGD on November 19 which showed segmental mild inflammation with erosions in the gastric antrum. She had a 6 mm nodule in the gastric body which was biopsied. There were also several small fundic gland polyps in the fundus. The biopsy of the gastritis was seen to be chronic and active gastritis with no H. pylori. 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  The patient's main complaint is nausea. She had biliary dyskinesia and had her gallbladder out by Dr. Harlow Asa 2 years ago. This did not seem to resolve her nausea. It did improve a little bit, but did not resolve it. She does not have a personal history of cancer, but has family history of breast cancer and her daughter had melanoma.  Of note, patient knows Jeralyn Ruths.   EGD pathology 11/04/2018 Stomach body biopsy poorly differentiated carcinoma with signet cell differentiation.   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 No definite evidence to suggest metastatic disease to the chest, abdomen or pelvis. 2. The patient's reported gastric tumor is not readily identified by CT. 3. Several tiny pulmonary nodules clustered in the left lower lobe measuring 4 mm or less in size, nonspecific but statistically likely benign. Attention at time of  routine follow-up cancer surveillance imaging is recommended to ensure stability. 4. Aortic atherosclerosis.  Aortic Atherosclerosis (ICD10-I70.0).  Ct chest 11/17/2018 IMPRESSION: 1. No definite evidence to suggest metastatic disease to the chest, abdomen or pelvis. 2. The patient's reported gastric tumor is not readily identified by CT. 3. Several tiny pulmonary nodules clustered in the left lower lobe measuring 4 mm or less in size, nonspecific but statistically likely benign. Attention at time of routine follow-up cancer surveillance imaging is recommended to ensure stability. 4. Aortic atherosclerosis.  Aortic Atherosclerosis (ICD10-I70.0).   Allergies  No Known Drug Allergies [09/28/2016]: Allergies Reconciled   Medication History  ALPRAZolam (0.25MG  Tablet, Oral) Active. TraMADol HCl (50MG  Tablet, Oral) Active. Alendronate Sodium (70MG  Tablet, Oral) Active. Cyclobenzaprine HCl (10MG  Tablet, Oral) Active. Meloxicam (15MG  Tablet, Oral) Active. Propranolol HCl (10MG  Tablet, Oral) Active. Pravastatin Sodium (40MG  Tablet, Oral) Active. DULoxetine HCl (30MG  Capsule DR Part, Oral) Active. TraZODone HCl (50MG  Tablet, Oral) Active. Omeprazole (20MG  Capsule DR, Oral) Active. MiraLax (Oral) Active. Vitamin D (Cholecalciferol) (400UNIT Capsule, Oral) Active. Magnesium (250MG  Tablet, Oral) Active. Ondansetron (4MG  Tablet Disint, Oral) Active. traZODone HCl (100MG  Tablet, Oral) Active. Folic Acid (1MG  Tablet, Oral) Active. Medications Reconciled    Review of Systems All other systems negative  Vitals  Weight: 170.8 lb Height: 61in Body Surface Area: 1.77 m Body Mass Index: 32.27 kg/m  Temp.: 99.67F  Pulse: 97 (Regular)  BP: 140/82 (Sitting, Left Arm, Standard)       Physical Exam  General Mental Status-Alert. General Appearance-Consistent with stated age. Hydration-Well hydrated. Voice-Normal.  Head and  Neck Head-normocephalic, atraumatic with no lesions or palpable masses. Trachea-midline. Thyroid Gland Characteristics - normal size and consistency.  Eye Eyeball - Bilateral-Extraocular movements intact. Sclera/Conjunctiva - Bilateral-No scleral icterus.  Chest and Lung Exam Chest and lung exam reveals -quiet, even and easy respiratory effort with no use of accessory muscles and on auscultation, normal breath sounds, no adventitious sounds and normal vocal resonance. Inspection Chest Wall - Normal. Back - normal.  Cardiovascular Cardiovascular examination reveals -normal heart sounds, regular rate and rhythm with no murmurs and normal pedal pulses bilaterally.  Abdomen Inspection Inspection of the abdomen reveals - No Hernias. Palpation/Percussion Palpation and Percussion of the abdomen reveal - Soft, Non Tender, No Rebound tenderness, No Rigidity (guarding) and No hepatosplenomegaly. Auscultation Auscultation of the abdomen reveals - Bowel sounds normal.  Neurologic Neurologic evaluation reveals -alert and oriented x 3 with no impairment of recent or remote memory. Mental Status-Normal.  Musculoskeletal Global Assessment -Note: no gross deformities.  Normal Exam - Left-Upper Extremity Strength Normal and Lower Extremity Strength Normal. Normal Exam - Right-Upper Extremity Strength Normal and Lower Extremity Strength Normal.  Lymphatic Head & Neck  General Head & Neck Lymphatics: Bilateral - Description - Normal. Axillary  General Axillary Region: Bilateral - Description - Normal. Tenderness - Non Tender. Femoral & Inguinal  Generalized Femoral & Inguinal Lymphatics: Bilateral - Description - No Generalized lymphadenopathy.    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 From the description on the endoscopy, this sounds like a very small  tumor. However, this will be difficult to localize. I will request an endoscopic ultrasound to make sure there is no evidence of infiltrating cancer. There was not any cancer seen on her other biopsy, but this would make sure there is no evidence on ultrasound. If they can see the spot of the biopsy, tattoo would also be helpful. Her staging studies are negative. I am going to assume this cannot be an early stage gastric cancer from the information that we have. If that is the case, surgery should be potentially curative.  I will go ahead and refer her to oncology. I advised the patient that if we saw the tumor was more extensive than we anticipated or if there were lymph nodes involved, I would plan on doing chemotherapy upfront. I discussed that the surgery would involve a diagnostic laparoscopy and if metastatic cancer was seen, I would plan to abort the procedure. I also discussed that the risk of surgery would include bleeding, infection, damage to adjacent structures, potential prolonged nausea, possible risk with the anastomosis of bleeding, leak, wound breakdown. I discussed the risk of recurrent cancer. I also discussed that I would place a feeding tube if we ended up taking a very large portion of her stomach to get negative margins. The patient understands and would like to proceed.  She has no cardiac history or pulmonary history. I do not think that she will need cardiac clearance.  This was discussed with patient, husband, and daughter. Current Plans Referred to Gastroenterology, for evaluation and follow up Physiological scientist). Urgent. Referred to Genetic Counseling, for evaluation and follow up [employer]). Routine. Referred to Oncology, for evaluation and follow up (Oncology). Routine. You are being scheduled for surgery- Our schedulers will call you.  You should hear from our office's scheduling department within 5 working days about the location, date, and time of  surgery. We try to make accommodations for patient's preferences in scheduling surgery, but sometimes the OR schedule or the surgeon's schedule prevents [place] from making  those accommodations.  If you have not heard from our office [phone]) in 5 working days, call the office and ask for your surgeon's nurse.  If you have other questions about your diagnosis, plan, or surgery, call the office and ask for your surgeon's nurse.  Pt Education - [facility] Open Abdominal [facility] Started Promethazine HCl 12.5 MG Oral Tablet, 1 (one) Tablet every six hours, as needed, #30, 11/24/2018, Ref. x3. 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 She has been off orencia for 6 weeks and off MTX for around 3 weeks. Advised to keep holding. FAMILY HISTORY OF CANCER (Z80.9) Impression: Refer to genetics. Sister's genetics was negative around 1-2 years ago, but we don't know if breast panel or if entire panel given daughter's history of melanoma and now patient's dx of gastric cancer.    Signed by [name], MD

## 2019-01-01 NOTE — H&P (Signed)
Jane Howe Documented: 12/22/2018 2:26 PM Location: [place] [facility] Patient #: [id number] DOB: 07/31/1978 Undefined / Language: Undefined / Race: Black or African American Female   History of Present Illness Jane Klein MD; 12/22/2018 3:17 PM) The patient is a 73 year old female who presents with an abdominal mass. Pt is a 73 yo F who is referred for consultation by Dr. Ardis Howe for a diagnosis of a pancreatic mass, presumably a solid pseudopapillary tumor. She presented to the ED wtih pain and nausea 10/14/2018. A Ct showed a 2.8 cm mass that was either in the posterior stomach or pancreaus. It was present on a scan in 2017 but was not discussed in the report. It appears less distinct at that point. She has continued to have pain. She had an EUS by Dr. Ardis Howe 12/5. cytology from FNA showed neoplastic cells. The patient has continued to have pain. She denies weight loss. She has no family history of cancer. She has been out of work due to the discomfort. She has not had fever/chills/diarrhea. nausea/vomiting has resolved.   CT abd/pelvis 10/14/2018 IMPRESSION: 1. No acute findings identified within the abdomen or pelvis. The appendix is not visualized on this exam however there are no secondary signs of acute appendicitis. 2. Small volume of free fluid noted within the pelvis. 3. There is a nodule/mass within the left upper quadrant of the abdomen. This appears closely associated with the posterior wall of pelvis and tail of pancreas. When compared with exam from 10/24/2016 this demonstrates mild increase in size in the interval. If arising from the stomach this may represent a slow growing GIST tumor. Alternatively, if arising from the pancreas, this may represent a slow growing, indolent neoplasm such as an islet cell tumor. Advise further evaluation with nonemergent GI consultation.  EUS Dr. Ardis Howe 11/20/2018 1. Mixed solid/cystic mass in the body of pancreas (predominantly  solid) that measures 3.2cm maximally. The mass has crisp margins and it does not obstruct the main pancreatic duct. The mass abuts the portal vein at the level of the confluence but does not appear to invade it. The mass was sampled with 2 transgastric passes with a 67 guage EUS FNA needle, color doppler. The pancreatic parenchyma is otherwise normal. 2. No peripancreatic adenopathy. 3. Gastric wall is normal. The posterior wall of the stomach directly abuts the mass above but the mass does not appear originate from the stomach. 4. CBD normal, non-dilated 5. Gallbladder was normal. 6. Limited views of the liver, spleen, portal and splenic vessels were all normal.  cytology 11/20/2018 Adequacy Reason Satisfactory For Evaluation. Diagnosis FINE NEEDLE ASPIRATION, ENDOSCOPIC PANCREAS, BODY (SPECIMEN 1 OF 1 COLLECTED 11/20/18) NEOPLASTIC CELLS PRESENT, SEE COMMENT.   Past Surgical History (Jane Howe, [place]; 12/22/2018 2:26 PM) Hysterectomy (not due to cancer) - Partial   Diagnostic Studies History (Jane Howe, [place]; 12/22/2018 2:26 PM) Colonoscopy  never Mammogram  never Pap Smear  1-5 years ago  Allergies (Jane Howe, CMA; 12/22/2018 2:27 PM) Bactrim *ANTI-INFECTIVE AGENTS - MISC.*  Nausea, Vomiting.  Medication History (Jane Howe, CMA; 12/22/2018 2:28 PM) Vitamin D (1000UNIT Tablet, Oral) Active. Fiber (Oral) Specific strength unknown - Active. Medications Reconciled  Social History (Jane Howe, CMA; 12/22/2018 2:26 PM) Caffeine use  Tea. No alcohol use  No drug use  Tobacco use  Current every day smoker.  Family History (Jane Howe, [place]; 12/22/2018 2:26 PM) Hypertension  Father, Mother. Migraine Headache  Mother. Thyroid problems  Mother.  Pregnancy / Birth History (Jane Howe, [place]; 12/22/2018  2:26 PM) Age at menarche  29 years. Gravida  3 Irregular periods  Maternal age  77-20 Para  2  Other Problems (Jane Howe, CMA; 12/22/2018 2:26 PM) No  pertinent past medical history     Review of Systems (Jane Howe CMA; 12/22/2018 2:26 PM) Skin Not Present- Change in Wart/Mole, Dryness, Hives, Jaundice, New Lesions, Non-Healing Wounds, Rash and Ulcer. HEENT Present- Seasonal Allergies. Not Present- Earache, Hearing Loss, Hoarseness, Nose Bleed, Oral Ulcers, Ringing in the Ears, Sinus Pain, Sore Throat, Visual Disturbances, Wears glasses/contact lenses and Yellow Eyes. Respiratory Not Present- Bloody sputum, Chronic Cough, Difficulty Breathing, Snoring and Wheezing. Breast Not Present- Breast Mass, Breast Pain, Nipple Discharge and Skin Changes. Cardiovascular Not Present- Chest Pain, Difficulty Breathing Lying Down, Leg Cramps, Palpitations, Rapid Heart Rate, Shortness of Breath and Swelling of Extremities. Gastrointestinal Present- Abdominal Pain, Change in Bowel Habits, Chronic diarrhea and Constipation. Not Present- Bloating, Bloody Stool, Difficulty Swallowing, Excessive gas, Gets full quickly at meals, Hemorrhoids, Indigestion, Nausea, Rectal Pain and Vomiting. Female Genitourinary Not Present- Frequency, Nocturia, Painful Urination, Pelvic Pain and Urgency. Musculoskeletal Not Present- Back Pain, Joint Pain, Joint Stiffness, Muscle Pain, Muscle Weakness and Swelling of Extremities. Neurological Not Present- Decreased Memory, Fainting, Headaches, Numbness, Seizures, Tingling, Tremor, Trouble walking and Weakness. Psychiatric Not Present- Anxiety, Bipolar, Change in Sleep Pattern, Depression, Fearful and Frequent crying. Endocrine Not Present- Cold Intolerance, Excessive Hunger, Hair Changes, Heat Intolerance, Hot flashes and New Diabetes. Hematology Not Present- Blood Thinners, Easy Bruising, Excessive bleeding, Gland problems, HIV and Persistent Infections.  Vitals (Jane Howe CMA; 12/22/2018 2:29 PM) 12/22/2018 2:28 PM Weight: 126.13 lb Height: 65in Body Surface Area: 1.63 m Body Mass Index: 20.99 kg/m  Temp.: 98.45F(Oral)   Pulse: 88 (Regular)  BP: 130/70 (Sitting, Left Arm, Standard)       Physical Exam Jane Klein MD; 12/22/2018 3:18 PM) General Mental Status-Alert. General Appearance-Consistent with stated age. Hydration-Well hydrated. Voice-Normal.  Head and Neck Head-normocephalic, atraumatic with no lesions or palpable masses. Trachea-midline. Thyroid Gland Characteristics - normal size and consistency.  Eye Eyeball - Bilateral-Extraocular movements intact. Sclera/Conjunctiva - Bilateral-No scleral icterus.  Chest and Lung Exam Chest and lung exam reveals -quiet, even and easy respiratory effort with no use of accessory muscles and on auscultation, normal breath sounds, no adventitious sounds and normal vocal resonance. Inspection Chest Wall - Normal. Back - normal.  Cardiovascular Cardiovascular examination reveals -normal heart sounds, regular rate and rhythm with no murmurs and normal pedal pulses bilaterally.  Abdomen Inspection Inspection of the abdomen reveals - No Hernias. Palpation/Percussion Palpation and Percussion of the abdomen reveal - Soft, No Rebound tenderness, No Rigidity (guarding) and No hepatosplenomegaly. Note: sl tender epigastrium and LUQ. Auscultation Auscultation of the abdomen reveals - Bowel sounds normal.  Neurologic Neurologic evaluation reveals -alert and oriented x 3 with no impairment of recent or remote memory. Mental Status-Normal.  Musculoskeletal Global Assessment -Note: no gross deformities.  Normal Exam - Left-Upper Extremity Strength Normal and Lower Extremity Strength Normal. Normal Exam - Right-Upper Extremity Strength Normal and Lower Extremity Strength Normal.  Lymphatic Head & Neck  General Head & Neck Lymphatics: Bilateral - Description - Normal. Axillary  General Axillary Region: Bilateral - Description - Normal. Tenderness - Non Tender. Femoral & Inguinal  Generalized Femoral & Inguinal  Lymphatics: Bilateral - Description - No Generalized lymphadenopathy.    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  Will plan hand assisted distal pancreatectomy with possible  splenectomy. The patient will need vaccines. If there is quite a bit of distal pancreas, will reimplant distal pancreas into the stomach wall. However, if it is diminuative in size, will just resect.  discussed surgery and risks with patient, mother, and grandmother. Reviewed risks of bleeding, infection, damage to adjacent structures, heart or lung problems, blood clot, death.  Pt has been out of work due to the pain. Will plan at the first available opportunity. Current Plans Pt Education - FB pancreatectomy You are being scheduled for surgery- Our schedulers will call you.  You should hear from our office's scheduling department within 5 working days about the location, date, and time of surgery. We try to make accommodations for patient's preferences in scheduling surgery, but sometimes the OR schedule or the surgeon's schedule prevents [place] from making those accommodations.  If you have not heard from our office [phone]) in 5 working days, call the office and ask for your surgeon's nurse.  If you have other questions about your diagnosis, plan, or surgery, call the office and ask for your surgeon's nurse.    Signed by Jane Klein, MD (12/22/2018 3:21 PM)

## 2019-01-01 NOTE — Transfer of Care (Signed)
Immediate Anesthesia Transfer of Care Note  Patient: Jane Howe  Procedure(s) Performed: LAPAROSCOPY DIAGNOSTIC (N/A Abdomen) OPEN PARTIAL GASTRECTOMY WITH POSSIBLE FEEDING TUBE (N/A Abdomen)  Patient Location: PACU  Anesthesia Type:General  Level of Consciousness: awake, alert  and oriented  Airway & Oxygen Therapy: Patient Spontanous Breathing and Patient connected to face mask oxygen  Post-op Assessment: Report given to RN and Post -op Vital signs reviewed and stable  Post vital signs: Reviewed and stable  Last Vitals:  Vitals Value Taken Time  BP    Temp    Pulse 79 01/01/2019 11:03 AM  Resp 19 01/01/2019 11:03 AM  SpO2 100 % 01/01/2019 11:03 AM  Vitals shown include unvalidated device data.  Last Pain:  Vitals:   01/01/19 0645  PainSc: 5       Patients Stated Pain Goal: 5 (27/73/75 0510)  Complications: No apparent anesthesia complications

## 2019-01-01 NOTE — Anesthesia Postprocedure Evaluation (Signed)
Anesthesia Post Note  Patient: Jane Howe  Procedure(s) Performed: LAPAROSCOPY DIAGNOSTIC (N/A Abdomen) OPEN PARTIAL GASTRECTOMY WITH POSSIBLE FEEDING TUBE (N/A Abdomen)     Patient location during evaluation: PACU Anesthesia Type: General Level of consciousness: awake and alert Pain management: pain level controlled Vital Signs Assessment: post-procedure vital signs reviewed and stable Respiratory status: spontaneous breathing, nonlabored ventilation, respiratory function stable and patient connected to nasal cannula oxygen Cardiovascular status: blood pressure returned to baseline and stable Postop Assessment: no apparent nausea or vomiting Anesthetic complications: no    Last Vitals:  Vitals:   01/01/19 2008 01/01/19 2054  BP:  (!) 132/58  Pulse:  97  Resp: 11 14  Temp:  36.6 C  SpO2: 94% 95%    Last Pain:  Vitals:   01/01/19 2054  TempSrc: Oral  PainSc:                  Matai Carpenito COKER

## 2019-01-01 NOTE — Discharge Instructions (Signed)
[facility]      [facility] °[phone] ° °ABDOMINAL SURGERY: POST OP INSTRUCTIONS ° °Always review your discharge instruction sheet given to you by the facility where your surgery was performed. ° °IF YOU HAVE DISABILITY OR FAMILY LEAVE FORMS, YOU MUST BRING THEM TO THE OFFICE FOR PROCESSING.  PLEASE DO NOT GIVE THEM TO YOUR DOCTOR. ° °1. A prescription for pain medication may be given to you upon discharge.  Take your pain medication as prescribed, if needed.  If narcotic pain medicine is not needed, then you may take acetaminophen (Tylenol) or ibuprofen (Advil) as needed. °2. Take your usually prescribed medications unless otherwise directed. °3. If you need a refill on your pain medication, please contact your pharmacy. They will contact our office to request authorization.  Prescriptions will not be filled after 5pm or on week-ends. °4. You should follow a light diet the first few days after arrival home, such as soup and crackers, pudding, etc.unless your doctor has advised otherwise. A high-fiber, low fat diet can be resumed as tolerated.   Be sure to include lots of fluids daily. Most patients will experience some swelling and bruising on the chest and neck area.  Ice packs will help.  Swelling and bruising can take several days to resolve °5. Most patients will experience some swelling and bruising in the area of the incision. Ice pack will help. Swelling and bruising can take several days to resolve..  °6. It is common to experience some constipation if taking pain medication after surgery.  Increasing fluid intake and taking a stool softener will usually help or prevent this problem from occurring.  A mild laxative (Milk of Magnesia or Miralax) should be taken according to package directions if there are no bowel movements after 48 hours. °7.  You may have steri-strips (small skin tapes) in place directly over the incision.  These strips should be left on the skin for 10-14 days.  If your  surgeon used skin glue on the incision, you may shower in 48 hours.  The glue will flake off over the next 2-3 weeks.  Any sutures or staples will be removed at the office during your follow-up visit. You may find that a light gauze bandage over your incision may keep your staples from being rubbed or pulled. You may shower and replace the bandage daily. °8. ACTIVITIES:  You may resume regular (light) daily activities beginning the next day--such as daily self-care, walking, climbing stairs--gradually increasing activities as tolerated.  You may have sexual intercourse when it is comfortable.  Refrain from any heavy lifting or straining until approved by your doctor. °a. You may drive when you no longer are taking prescription pain medication, you can comfortably wear a seatbelt, and you can safely maneuver your car and apply brakes °b. Return to Work: __________8 weeks if applicable_________________________ °9. You should see your doctor in the office for a follow-up appointment approximately two weeks after your surgery.  Make sure that you call for this appointment within a day or two after you arrive home to insure a convenient appointment time. °OTHER INSTRUCTIONS:  °_____________________________________________________________ °_____________________________________________________________ ° °WHEN TO CALL YOUR DOCTOR: °1. Fever over 101.0 °2. Inability to urinate °3. Nausea and/or vomiting °4. Extreme swelling or bruising °5. Continued bleeding from incision. °6. Increased pain, redness, or drainage from the incision. °7. Difficulty swallowing or breathing °8. Muscle cramping or spasms. °9. Numbness or tingling in hands or feet or around lips. ° °The clinic staff is   available to answer your questions during regular business hours.  Please don’t hesitate to call and ask to speak to one of the nurses if you have concerns. ° °For further questions, please visit [email or web address] ° ° ° °

## 2019-01-01 NOTE — Interval H&P Note (Signed)
History and Physical Interval Note:  01/01/2019 8:00 AM  Jane Howe  has presented today for surgery, with the diagnosis of gastric cancer  The various methods of treatment have been discussed with the patient and family. After consideration of risks, benefits and other options for treatment, the patient has consented to  Procedure(s): LAPAROSCOPY DIAGNOSTIC (N/A) OPEN PARTIAL GASTRECTOMY WITH POSSIBLE FEEDING TUBE (N/A) as a surgical intervention .  The patient's history has been reviewed, patient examined, no change in status, stable for surgery.  I have reviewed the patient's chart and labs.  Questions were answered to the patient's satisfaction.     [name]

## 2019-01-01 NOTE — Op Note (Signed)
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  FINDINGS:  No evidence of carcinomatosis, mass in distal stomach, posterior antrum no evidence of enlarged LN.    EBL: 100 mL  PROCEDURE:    The patient was identified in the holding area and was taken to the OR where she was placed supine on the operating room table.  General anesthesia was induced.  The left arm was tucked, and foley catheter was placed.  Abdomen was prepped and draped in sterile fashion.  Timeout was performed according to the surgical safety checklist.  When all was correct, we continued.    The patient was rotated to the left and placed into reverse trendelenburg position.  A 5 mm incision was made at the costal margin and a 5 mm Optiview trocar was placed under direct visualization.  Pneumoperitoneum was achieved to a pressure of 15 mm Hg.  The abdomen was examined with the camera and no evidence of carcinomatosis was seen.  An upper midline incision was made in the abdomen and the abdomen was then manually explored.  The stomach was mobile, and the pelvis and undersurface of the liver was palpated.  Again, no carcinomatosis was identified.    The Bookwalter was used to assist with visualization.  The stomach was mobilized.  The omentum was taken off the colon and the lesser sac was entered.  The tattoo was visible on the posterior stomach.  The pylorus was  identified and the harmonic was used to open up around the pylorus.  The TA 30 was used to divide the proximal duodenum.jj The Harmonic was used to take down the gastroepiploics and the edge of the lesser curve and greater curve were exposed.  Two blue loads of the GIA 75 mm stapler were used to divide the stomach.   The specimen was marked and sent to pathology.  The staple line of the duodenum and the stomach were oversewn with 3-0 PDS suture.    The gastrojejunostomy was then performed.  This was antecolic and retrogastric.  The jejunum was secured to the posterior wall of the stomach with two 3-0 silk sutures.  The stomach and jejunum were opened, and the 75 mm stapler was used to create a stapled linear anastamosis. The NGT was advanced into the efferent limb of the jejunum, and the defect was closed with two 3-0 PDS sutures in connell fashion.  The NGT was secured.  Additional silk sutures were used as anti-tension sutures.    The abdomen was then irrigated.  A sponge count was performed.  The OnQ tunnelers were placed on either side of the incision in the preperitoneal space.  The fascia was then closed with two #1 looped PDS sutures.  The skin was irrigated and closed with staples.  The OnQ catheters were advanced into the tunneler sheaths and the sheaths were removed.  The abdomen was then cleaned, dried, and dressed with dry sterile dressings.  Needle, sponge, and instrument counts were correct x 2.  The patient was allowed to emerge from anesthesia and taken to the PACU in stable condition.

## 2019-01-02 ENCOUNTER — Encounter (HOSPITAL_COMMUNITY): Payer: Self-pay | Admitting: General Surgery

## 2019-01-02 LAB — CBC
HCT: 38.4 % (ref 36.0–46.0)
Hemoglobin: 12.6 g/dL (ref 12.0–15.0)
MCH: 29.9 pg (ref 26.0–34.0)
MCHC: 32.8 g/dL (ref 30.0–36.0)
MCV: 91.2 fL (ref 80.0–100.0)
Platelets: 250 10*3/uL (ref 150–400)
RBC: 4.21 MIL/uL (ref 3.87–5.11)
RDW: 13 % (ref 11.5–15.5)
WBC: 19.9 10*3/uL — ABNORMAL HIGH (ref 4.0–10.5)
nRBC: 0 % (ref 0.0–0.2)

## 2019-01-02 LAB — BASIC METABOLIC PANEL
Anion gap: 11 (ref 5–15)
BUN: 11 mg/dL (ref 8–23)
CO2: 25 mmol/L (ref 22–32)
Calcium: 8.3 mg/dL — ABNORMAL LOW (ref 8.9–10.3)
Chloride: 100 mmol/L (ref 98–111)
Creatinine, Ser: 0.91 mg/dL (ref 0.44–1.00)
GFR calc Af Amer: >60 mL/min (ref >60)
GFR calc non Af Amer: >60 mL/min (ref >60)
Glucose, Bld: 149 mg/dL — ABNORMAL HIGH (ref 70–99)
Potassium: 4.3 mmol/L (ref 3.5–5.1)
Sodium: 136 mmol/L (ref 135–145)

## 2019-01-02 MED ORDER — PHENOL 1.4 % MT LIQD
1.0000 | OROMUCOSAL | Status: DC | PRN
  Administered 2019-01-02: 1 via OROMUCOSAL
  Filled 2019-01-02: qty 177

## 2019-01-02 NOTE — Plan of Care (Signed)
  Problem: Coping: Goal: Level of anxiety will decrease Outcome: Progressing   Problem: Pain Managment: Goal: General experience of comfort will improve Outcome: Progressing   

## 2019-01-02 NOTE — Progress Notes (Signed)
1 Day Post-Op   Subjective/Chief Complaint: C/o sore throat.  No n/v.     Objective: Vital signs in last 24 hours: Temp:  [97.5 F (36.4 C)-98.1 F (36.7 C)] 98.1 F (36.7 C) (01/17 0510) Pulse Rate:  [76-97] 92 (01/17 0510) Resp:  [10-27] 13 (01/17 0510) BP: (105-154)/(49-66) 139/64 (01/17 0510) SpO2:  [92 %-100 %] 97 % (01/17 0510) Weight:  [82.4 kg] 82.4 kg (01/16 1503)    Intake/Output from previous day: 01/16 0701 - 01/17 0700 In: 3150 [I.V.:2850; IV Piggyback:300] Out: 1615 [Urine:1275; Emesis/NG output:240; Blood:100] Intake/Output this shift: Total I/O In: 1300 [I.V.:1000; IV Piggyback:300] Out: 640 [Urine:400; Emesis/NG output:240]  General appearance: alert, cooperative and mild distress Resp: breathing comfortably GI: soft, mildly distended.  onq in place.  ngt bilious.    Lab Results:  Recent Labs    01/01/19 2009 01/02/19 0437  WBC 20.2* 19.9*  HGB 13.3 12.6  HCT 40.1 38.4  PLT 242 250   BMET Recent Labs    01/01/19 2009 01/02/19 0437  NA  --  136  K  --  4.3  CL  --  100  CO2  --  25  GLUCOSE  --  149*  BUN  --  11  CREATININE 1.04* 0.91  CALCIUM  --  8.3*   PT/INR No results for input(s): LABPROT, INR in the last 72 hours. ABG No results for input(s): PHART, HCO3 in the last 72 hours.  Invalid input(s): PCO2, PO2  Studies/Results: No results found.  Anti-infectives: Anti-infectives (From admission, onward)   Start     Dose/Rate Route Frequency Ordered Stop   01/01/19 1730  ceFAZolin (ANCEF) IVPB 2g/100 mL premix     2 g 200 mL/hr over 30 Minutes Intravenous Every 8 hours 01/01/19 1657 01/01/19 1848   01/01/19 0630  ceFAZolin (ANCEF) IVPB 2g/100 mL premix     2 g 200 mL/hr over 30 Minutes Intravenous On call to O.R. 01/01/19 3300 01/01/19 0857      Assessment/Plan: s/p Procedure(s): LAPAROSCOPY DIAGNOSTIC (N/A) OPEN PARTIAL GASTRECTOMY WITH POSSIBLE FEEDING TUBE (N/A) ngt/npo until tomorrow.  Then clear liquids Throat  spray Anticipate oral meds to start tomorrow.    LOS: 1 day    [name] 01/02/2019

## 2019-01-02 NOTE — Progress Notes (Signed)
Pt 02 decrease to 88 while sleeping increase the 02 fr 2 liters to 3 liters, maintain 02 sat above 91%.

## 2019-01-03 LAB — BASIC METABOLIC PANEL
Anion gap: 6 (ref 5–15)
BUN: 7 mg/dL — ABNORMAL LOW (ref 8–23)
CO2: 28 mmol/L (ref 22–32)
Calcium: 8.2 mg/dL — ABNORMAL LOW (ref 8.9–10.3)
Chloride: 105 mmol/L (ref 98–111)
Creatinine, Ser: 0.75 mg/dL (ref 0.44–1.00)
GFR calc Af Amer: >60 mL/min (ref >60)
GFR calc non Af Amer: >60 mL/min (ref >60)
Glucose, Bld: 126 mg/dL — ABNORMAL HIGH (ref 70–99)
Potassium: 4.2 mmol/L (ref 3.5–5.1)
Sodium: 139 mmol/L (ref 135–145)

## 2019-01-03 LAB — CBC
HCT: 37.3 % (ref 36.0–46.0)
Hemoglobin: 11.5 g/dL — ABNORMAL LOW (ref 12.0–15.0)
MCH: 28.9 pg (ref 26.0–34.0)
MCHC: 30.8 g/dL (ref 30.0–36.0)
MCV: 93.7 fL (ref 80.0–100.0)
Platelets: 202 10*3/uL (ref 150–400)
RBC: 3.98 MIL/uL (ref 3.87–5.11)
RDW: 13.2 % (ref 11.5–15.5)
WBC: 15.3 10*3/uL — ABNORMAL HIGH (ref 4.0–10.5)
nRBC: 0 % (ref 0.0–0.2)

## 2019-01-03 NOTE — Progress Notes (Signed)
Pt's temp at this time is 100.5. Notified MD for possible fever. MD stated to watch pt's temp. No orders received.

## 2019-01-03 NOTE — Progress Notes (Signed)
Patient ID: Jane Howe, female   DOB: 1946/02/09, 73 y.o.   MRN: [id number] [facility] Surgery Progress Note:   2 Days Post-Op  Subjective: Mental status is clear considering PCA.  Having abdominal pain; onQ things in place Objective: Vital signs in last 24 hours: Temp:  [98.2 F (36.8 C)-100.5 F (38.1 C)] 99.4 F (37.4 C) (01/18 0650) Pulse Rate:  [89-106] 98 (01/18 0650) Resp:  [9-16] 13 (01/18 0831) BP: (121-159)/(50-76) 156/66 (01/18 0650) SpO2:  [95 %-99 %] 99 % (01/18 0831)  Intake/Output from previous day: 01/17 0701 - 01/18 0700 In: 2281.9 [I.V.:2231.9; IV Piggyback:50] Out: 2950 [Urine:2150; Emesis/NG output:800] Intake/Output this shift: No intake/output data recorded.  Physical Exam: Work of breathing is not labored.  NG put out 600 green liquid last shift;  No flatus  Lab Results:  Results for orders placed or performed during the hospital encounter of 01/01/19 (from the past 48 hour(s))  CBC     Status: Abnormal   Collection Time: 01/01/19  8:09 PM  Result Value Ref Range   WBC 20.2 (H) 4.0 - 10.5 K/uL   RBC 4.41 3.87 - 5.11 MIL/uL   Hemoglobin 13.3 12.0 - 15.0 g/dL   HCT 40.1 36.0 - 46.0 %   MCV 90.9 80.0 - 100.0 fL   MCH 30.2 26.0 - 34.0 pg   MCHC 33.2 30.0 - 36.0 g/dL   RDW 12.9 11.5 - 15.5 %   Platelets 242 150 - 400 K/uL   nRBC 0.0 0.0 - 0.2 %    Comment: Performed at [facility], [place] [address] [place], [place] [address]  Creatinine, serum     Status: Abnormal   Collection Time: 01/01/19  8:09 PM  Result Value Ref Range   Creatinine, Ser 1.04 (H) 0.44 - 1.00 mg/dL   GFR calc non Af Amer 54 (L) >60 mL/min   GFR calc Af Amer >60 >60 mL/min    Comment: Performed at [facility] [address], [place], [place] [address]  CBC     Status: Abnormal   Collection Time: 01/02/19  4:37 AM  Result Value Ref Range   WBC 19.9 (H) 4.0 - 10.5 K/uL   RBC 4.21 3.87 - 5.11 MIL/uL   Hemoglobin 12.6 12.0 - 15.0 g/dL   HCT 38.4 36.0 - 46.0  %   MCV 91.2 80.0 - 100.0 fL   MCH 29.9 26.0 - 34.0 pg   MCHC 32.8 30.0 - 36.0 g/dL   RDW 13.0 11.5 - 15.5 %   Platelets 250 150 - 400 K/uL   nRBC 0.0 0.0 - 0.2 %    Comment: Performed at [facility], [place] [address], [place], [place] [address]  Basic metabolic panel     Status: Abnormal   Collection Time: 01/02/19  4:37 AM  Result Value Ref Range   Sodium 136 135 - 145 mmol/L   Potassium 4.3 3.5 - 5.1 mmol/L   Chloride 100 98 - 111 mmol/L   CO2 25 22 - 32 mmol/L   Glucose, Bld 149 (H) 70 - 99 mg/dL   BUN 11 8 - 23 mg/dL   Creatinine, Ser 0.91 0.44 - 1.00 mg/dL   Calcium 8.3 (L) 8.9 - 10.3 mg/dL   GFR calc non Af Amer >60 >60 mL/min   GFR calc Af Amer >60 >60 mL/min   Anion gap 11 5 - 15    Comment: Performed at [facility], [place] [address],  [place], [place] [address]  CBC     Status: Abnormal   Collection Time: 01/03/19  3:07 AM  Result Value Ref Range   WBC 15.3 (H) 4.0 - 10.5 K/uL   RBC 3.98 3.87 - 5.11 MIL/uL   Hemoglobin 11.5 (L) 12.0 - 15.0 g/dL   HCT 37.3 36.0 - 46.0 %   MCV 93.7 80.0 - 100.0 fL   MCH 28.9 26.0 - 34.0 pg   MCHC 30.8 30.0 - 36.0 g/dL   RDW 13.2 11.5 - 15.5 %   Platelets 202 150 - 400 K/uL   nRBC 0.0 0.0 - 0.2 %    Comment: Performed at [facility], [place] [address], [place], [place] [address]  Basic metabolic panel     Status: Abnormal   Collection Time: 01/03/19  3:07 AM  Result Value Ref Range   Sodium 139 135 - 145 mmol/L   Potassium 4.2 3.5 - 5.1 mmol/L   Chloride 105 98 - 111 mmol/L   CO2 28 22 - 32 mmol/L   Glucose, Bld 126 (H) 70 - 99 mg/dL   BUN 7 (L) 8 - 23 mg/dL   Creatinine, Ser 0.75 0.44 - 1.00 mg/dL   Calcium 8.2 (L) 8.9 - 10.3 mg/dL   GFR calc non Af Amer >60 >60 mL/min   GFR calc Af Amer >60 >60 mL/min   Anion gap 6 5 - 15    Comment: Performed at [facility], [place] [address], [place], [place] [address]    Radiology/Results: No results found.  Anti-infectives: Anti-infectives (From admission,  onward)   Start     Dose/Rate Route Frequency Ordered Stop   01/01/19 1730  ceFAZolin (ANCEF) IVPB 2g/100 mL premix     2 g 200 mL/hr over 30 Minutes Intravenous Every 8 hours 01/01/19 1657 01/01/19 1848   01/01/19 0630  ceFAZolin (ANCEF) IVPB 2g/100 mL premix     2 g 200 mL/hr over 30 Minutes Intravenous On call to O.R. 01/01/19 0620 01/01/19 0857      Assessment/Plan: Problem List: Patient Active Problem List   Diagnosis Date Noted  . Malignant neoplasm of antrum of stomach ([place]) 01/01/2019  . Genetic testing 12/29/2018  . Gastric cancer ([place]) 12/15/2018  . Family history of breast cancer   . Family history of colon cancer   . Family history of lung cancer   . Family history of melanoma   . Biliary dyskinesia 11/20/2016  . OA (osteoarthritis) of knee 07/16/2016    Will keep NG in place and give popsicle (sham feed);  Not ready for diet advancement. 2 Days Post-Op    LOS: 2 days   Matt B. Hassell Done, MD, [facility] [phone] beeper [phone]  01/03/2019 8:42 AM

## 2019-01-03 NOTE — Progress Notes (Signed)
Pt was OOB to chair for 4 hours, tolerates, well, discontinued foley and she had urine output using the bedside commode, 400 ml NGT output, family at the bedside.

## 2019-01-03 NOTE — Progress Notes (Signed)
Pt discontinued foley cath.

## 2019-01-04 LAB — BASIC METABOLIC PANEL
Anion gap: 8 (ref 5–15)
BUN: <5 mg/dL — ABNORMAL LOW (ref 8–23)
CO2: 30 mmol/L (ref 22–32)
Calcium: 8.6 mg/dL — ABNORMAL LOW (ref 8.9–10.3)
Chloride: 102 mmol/L (ref 98–111)
Creatinine, Ser: 0.54 mg/dL (ref 0.44–1.00)
GFR calc Af Amer: >60 mL/min (ref >60)
GFR calc non Af Amer: >60 mL/min (ref >60)
Glucose, Bld: 125 mg/dL — ABNORMAL HIGH (ref 70–99)
Potassium: 4.6 mmol/L (ref 3.5–5.1)
Sodium: 140 mmol/L (ref 135–145)

## 2019-01-04 LAB — CBC
HCT: 35.7 % — ABNORMAL LOW (ref 36.0–46.0)
Hemoglobin: 11.6 g/dL — ABNORMAL LOW (ref 12.0–15.0)
MCH: 30.1 pg (ref 26.0–34.0)
MCHC: 32.5 g/dL (ref 30.0–36.0)
MCV: 92.5 fL (ref 80.0–100.0)
Platelets: 200 10*3/uL (ref 150–400)
RBC: 3.86 MIL/uL — ABNORMAL LOW (ref 3.87–5.11)
RDW: 12.7 % (ref 11.5–15.5)
WBC: 13.3 10*3/uL — ABNORMAL HIGH (ref 4.0–10.5)
nRBC: 0 % (ref 0.0–0.2)

## 2019-01-04 NOTE — Plan of Care (Signed)

## 2019-01-04 NOTE — Progress Notes (Signed)
Patient ID: Jane Howe, female   DOB: [birth year], 73 y.o.   MRN: [id number] [facility] Surgery Progress Note:   3 Days Post-Op  Subjective: Mental status is clear.  Less nauseated today.   Objective: Vital signs in last 24 hours: Temp:  [98.5 F (36.9 C)-98.7 F (37.1 C)] 98.5 F (36.9 C) (01/19 0520) Pulse Rate:  [80-91] 80 (01/19 0520) Resp:  [11-22] 16 (01/19 0520) BP: (139-164)/(52-74) 144/74 (01/19 0520) SpO2:  [93 %-100 %] 100 % (01/19 0520)  Intake/Output from previous day: 01/18 0701 - 01/19 0700 In: 2405.1 [I.V.:2405.1] Out: 1075 [Urine:275; Emesis/NG output:800] Intake/Output this shift: No intake/output data recorded.  Physical Exam: Work of breathing is normal.  No good flatus or BM; abdomen is appropriately sore  Lab Results:  Results for orders placed or performed during the hospital encounter of 01/01/19 (from the past 48 hour(s))  CBC     Status: Abnormal   Collection Time: 01/03/19  3:07 AM  Result Value Ref Range   WBC 15.3 (H) 4.0 - 10.5 K/uL   RBC 3.98 3.87 - 5.11 MIL/uL   Hemoglobin 11.5 (L) 12.0 - 15.0 g/dL   HCT 37.3 36.0 - 46.0 %   MCV 93.7 80.0 - 100.0 fL   MCH 28.9 26.0 - 34.0 pg   MCHC 30.8 30.0 - 36.0 g/dL   RDW 13.2 11.5 - 15.5 %   Platelets 202 150 - 400 K/uL   nRBC 0.0 0.0 - 0.2 %    Comment: Performed at [facility], [place] [address] [place], [place] [address]  Basic metabolic panel     Status: Abnormal   Collection Time: 01/03/19  3:07 AM  Result Value Ref Range   Sodium 139 135 - 145 mmol/L   Potassium 4.2 3.5 - 5.1 mmol/L   Chloride 105 98 - 111 mmol/L   CO2 28 22 - 32 mmol/L   Glucose, Bld 126 (H) 70 - 99 mg/dL   BUN 7 (L) 8 - 23 mg/dL   Creatinine, Ser 0.75 0.44 - 1.00 mg/dL   Calcium 8.2 (L) 8.9 - 10.3 mg/dL   GFR calc non Af Amer >60 >60 mL/min   GFR calc Af Amer >60 >60 mL/min   Anion gap 6 5 - 15    Comment: Performed at [facility], [place] [address], [place], [place] [address]  CBC     Status:  Abnormal   Collection Time: 01/04/19  3:50 AM  Result Value Ref Range   WBC 13.3 (H) 4.0 - 10.5 K/uL   RBC 3.86 (L) 3.87 - 5.11 MIL/uL   Hemoglobin 11.6 (L) 12.0 - 15.0 g/dL   HCT 35.7 (L) 36.0 - 46.0 %   MCV 92.5 80.0 - 100.0 fL   MCH 30.1 26.0 - 34.0 pg   MCHC 32.5 30.0 - 36.0 g/dL   RDW 12.7 11.5 - 15.5 %   Platelets 200 150 - 400 K/uL   nRBC 0.0 0.0 - 0.2 %    Comment: Performed at [facility], [place] [address] [place], [place] [address]  Basic metabolic panel     Status: Abnormal   Collection Time: 01/04/19  3:50 AM  Result Value Ref Range   Sodium 140 135 - 145 mmol/L   Potassium 4.6 3.5 - 5.1 mmol/L   Chloride 102 98 - 111 mmol/L   CO2 30 22 - 32 mmol/L   Glucose, Bld 125 (H) 70 - 99 mg/dL   BUN <5 (L)  8 - 23 mg/dL   Creatinine, Ser 0.54 0.44 - 1.00 mg/dL   Calcium 8.6 (L) 8.9 - 10.3 mg/dL   GFR calc non Af Amer >60 >60 mL/min   GFR calc Af Amer >60 >60 mL/min   Anion gap 8 5 - 15    Comment: Performed at [facility] [address] [place], [place] [address]    Radiology/Results: No results found.  Anti-infectives: Anti-infectives (From admission, onward)   Start     Dose/Rate Route Frequency Ordered Stop   01/01/19 1730  ceFAZolin (ANCEF) IVPB 2g/100 mL premix     2 g 200 mL/hr over 30 Minutes Intravenous Every 8 hours 01/01/19 1657 01/01/19 1848   01/01/19 0630  ceFAZolin (ANCEF) IVPB 2g/100 mL premix     2 g 200 mL/hr over 30 Minutes Intravenous On call to O.R. 01/01/19 0620 01/01/19 0857      Assessment/Plan: Problem List: Patient Active Problem List   Diagnosis Date Noted  . Malignant neoplasm of antrum of stomach ([place]) 01/01/2019  . Genetic testing 12/29/2018  . Gastric cancer ([place]) 12/15/2018  . Family history of breast cancer   . Family history of colon cancer   . Family history of lung cancer   . Family history of melanoma   . Biliary dyskinesia 11/20/2016  . OA (osteoarthritis) of knee 07/16/2016    Will remove NG;  Get up  and walk;  Keep NPO 3 Days Post-Op    LOS: 3 days   Matt B. Hassell Done, MD, [facility] [phone] beeper [phone]  01/04/2019 8:45 AM

## 2019-01-05 LAB — BASIC METABOLIC PANEL
Anion gap: 9 (ref 5–15)
BUN: <5 mg/dL — ABNORMAL LOW (ref 8–23)
CO2: 27 mmol/L (ref 22–32)
Calcium: 8.6 mg/dL — ABNORMAL LOW (ref 8.9–10.3)
Chloride: 103 mmol/L (ref 98–111)
Creatinine, Ser: 0.57 mg/dL (ref 0.44–1.00)
GFR calc Af Amer: >60 mL/min (ref >60)
GFR calc non Af Amer: >60 mL/min (ref >60)
Glucose, Bld: 121 mg/dL — ABNORMAL HIGH (ref 70–99)
Potassium: 4 mmol/L (ref 3.5–5.1)
Sodium: 139 mmol/L (ref 135–145)

## 2019-01-05 LAB — CBC
HCT: 33.6 % — ABNORMAL LOW (ref 36.0–46.0)
Hemoglobin: 10.9 g/dL — ABNORMAL LOW (ref 12.0–15.0)
MCH: 29.3 pg (ref 26.0–34.0)
MCHC: 32.4 g/dL (ref 30.0–36.0)
MCV: 90.3 fL (ref 80.0–100.0)
Platelets: 195 10*3/uL (ref 150–400)
RBC: 3.72 MIL/uL — ABNORMAL LOW (ref 3.87–5.11)
RDW: 12.5 % (ref 11.5–15.5)
WBC: 9.4 10*3/uL (ref 4.0–10.5)
nRBC: 0 % (ref 0.0–0.2)

## 2019-01-05 MED ORDER — MORPHINE SULFATE (PF) 2 MG/ML IV SOLN: 1.0000 mg | INTRAVENOUS | Status: DC | PRN

## 2019-01-05 NOTE — Progress Notes (Signed)
Patient ID: Jane Howe, female   DOB: 03-23-1946, 73 y.o.   MRN: [id number] [facility] Surgery Progress Note:   3 Days Post-Op  Subjective: Doing better.  No emesis. Still having some nausea.  + small amount of flatus.  Not using much pain medication.    Objective: Vital signs in last 24 hours: Temp:  [97.7 F (36.5 C)-99.3 F (37.4 C)] 97.7 F (36.5 C) (01/20 1045) Pulse Rate:  [79-91] 79 (01/20 1045) Resp:  [7-18] 14 (01/20 1205) BP: (110-146)/(48-65) 127/48 (01/20 1045) SpO2:  [94 %-100 %] 100 % (01/20 1205)  Intake/Output from previous day: 01/19 0701 - 01/20 0700 In: 2259.6 [I.V.:2259.6] Out: 200 [Emesis/NG output:200] Intake/Output this shift: Total I/O In: 0  Out: 450 [Urine:450]  Physical Exam: no significant change.  Work of breathing is normal.  abd s, non tender, non distended.    Lab Results:  Results for orders placed or performed during the hospital encounter of 01/01/19 (from the past 48 hour(s))  CBC     Status: Abnormal   Collection Time: 01/04/19  3:50 AM  Result Value Ref Range   WBC 13.3 (H) 4.0 - 10.5 K/uL   RBC 3.86 (L) 3.87 - 5.11 MIL/uL   Hemoglobin 11.6 (L) 12.0 - 15.0 g/dL   HCT 35.7 (L) 36.0 - 46.0 %   MCV 92.5 80.0 - 100.0 fL   MCH 30.1 26.0 - 34.0 pg   MCHC 32.5 30.0 - 36.0 g/dL   RDW 12.7 11.5 - 15.5 %   Platelets 200 150 - 400 K/uL   nRBC 0.0 0.0 - 0.2 %    Comment: Performed at [facility], [place] [address], [place], [place] [address]  Basic metabolic panel     Status: Abnormal   Collection Time: 01/04/19  3:50 AM  Result Value Ref Range   Sodium 140 135 - 145 mmol/L   Potassium 4.6 3.5 - 5.1 mmol/L   Chloride 102 98 - 111 mmol/L   CO2 30 22 - 32 mmol/L   Glucose, Bld 125 (H) 70 - 99 mg/dL   BUN <5 (L) 8 - 23 mg/dL   Creatinine, Ser 0.54 0.44 - 1.00 mg/dL   Calcium 8.6 (L) 8.9 - 10.3 mg/dL   GFR calc non Af Amer >60 >60 mL/min   GFR calc Af Amer >60 >60 mL/min   Anion gap 8 5 - 15    Comment: Performed at [facility], [place] [address], [place], [place] [address]  CBC     Status: Abnormal   Collection Time: 01/05/19  1:46 AM  Result Value Ref Range   WBC 9.4 4.0 - 10.5 K/uL   RBC 3.72 (L) 3.87 - 5.11 MIL/uL   Hemoglobin 10.9 (L) 12.0 - 15.0 g/dL   HCT 33.6 (L) 36.0 - 46.0 %   MCV 90.3 80.0 - 100.0 fL   MCH 29.3 26.0 - 34.0 pg   MCHC 32.4 30.0 - 36.0 g/dL   RDW 12.5 11.5 - 15.5 %   Platelets 195 150 - 400 K/uL   nRBC 0.0 0.0 - 0.2 %    Comment: Performed at [facility], [place] [address] [place], [place] [address]  Basic metabolic panel     Status: Abnormal   Collection Time: 01/05/19  1:46 AM  Result Value Ref Range   Sodium 139 135 - 145 mmol/L   Potassium 4.0 3.5 - 5.1 mmol/L   Chloride 103 98 - 111 mmol/L  CO2 27 22 - 32 mmol/L   Glucose, Bld 121 (H) 70 - 99 mg/dL   BUN <5 (L) 8 - 23 mg/dL   Creatinine, Ser 0.57 0.44 - 1.00 mg/dL   Calcium 8.6 (L) 8.9 - 10.3 mg/dL   GFR calc non Af Amer >60 >60 mL/min   GFR calc Af Amer >60 >60 mL/min   Anion gap 9 5 - 15    Comment: Performed at [facility] [address], [place], [place] [address]    Radiology/Results: No results found.  Anti-infectives: Anti-infectives (From admission, onward)   Start     Dose/Rate Route Frequency Ordered Stop   01/01/19 1730  ceFAZolin (ANCEF) IVPB 2g/100 mL premix     2 g 200 mL/hr over 30 Minutes Intravenous Every 8 hours 01/01/19 1657 01/01/19 1848   01/01/19 0630  ceFAZolin (ANCEF) IVPB 2g/100 mL premix     2 g 200 mL/hr over 30 Minutes Intravenous On call to O.R. 01/01/19 0620 01/01/19 0857      Assessment/Plan: Problem List: Patient Active Problem List   Diagnosis Date Noted  . Malignant neoplasm of antrum of stomach ([place]) 01/01/2019  . Genetic testing 12/29/2018  . Gastric cancer ([place]) 12/15/2018  . Family history of breast cancer   . Family history of colon cancer   . Family history of lung cancer   . Family history of melanoma   . Biliary dyskinesia 11/20/2016  .  OA (osteoarthritis) of knee 07/16/2016    D/c PCA Clear liquids Continue ambulating Nutrition consult D/c OnQ.   LOS: 4 days   01/05/2019 1:26 PM

## 2019-01-05 NOTE — Progress Notes (Signed)
Wasted 79ml of morphine to stericycle witnessed by [place], Therapist, sports.

## 2019-01-05 NOTE — Consult Note (Signed)
[facility] Primary Care Navigator  01/05/2019  Jane Howe 06-Dec-1946 [id number]   Met with patient and husband Jane Howe) at thebedside to identify possible discharge needs.  According topatient, she was referred by Dr. Michail Sermon for a new diagnosis of gastric cancer. Patient reports having persistent nausea, increased heartburn/ reflux, had EGD- esophagogastroduodenoscopy and CT of abdomen done. (malignant neoplasm of body of stomach, underwent open partial gastrectomy)  Patientendorses Dr. Cari Caraway with [facility] [place] care provider.   [place] to obtain medications withoutdifficulty so far. Patient was aware to notify primary care provider if there are any issues that may arise in order to get assistance in the future.  Patient hasbeenmanagingherown medications at home with use of "pill box" system filled every week.  Patient states that shehasbeendriving prior to admission/ surgery but husband will provide transportation to her doctors'appointments after discharge.  Patient lives with husband who will serve as her primary caregiver at home.  [place] to be determined pending surgical improvement, but patient hopes to be discharged home when ready.   Patientand husband voiced understanding to call primary care provider's office when shereturns back homefor a post discharge follow-up visit within1- 2weeksor sooner if needed.Patient letter (with PCP's contact number) was provided asareminder.  Explained to patient and husband regardingTHN CM services available for healthmanagementand resourcesathomebut she denies any need for services at this point. She states being able to manage her health needs so far with husband's assistance.   Patientverbalizedunderstandingof needto seekreferral from primary care provider to [facility] care  management ifdeemed necessary and appropriatefor anyservicesin thefuture.   [facility] care management information provided for future needs that she may have.   Patient however, verbally agreedand optedforEMMIcalls to follow-up with herrecovery at home.   Referral made for [facility] calls after discharge.  Primary care provider's office is listed as providing transition of care (TOC) follow-up.   For additional questions please contact:  Edwena Felty A. Sherlock Nancarrow, BSN, RN-BC [facility] PRIMARY CARE Navigator Cell: [phone]

## 2019-01-05 NOTE — Care Management Important Message (Signed)
Important Message  Patient Details  Name: Jane Howe MRN: [id number] Date of Birth: 1946-10-21   Medicare Important Message Given:  Yes    Orbie Pyo 01/05/2019, 4:26 PM

## 2019-01-05 NOTE — Progress Notes (Signed)
Per MD order discontinued PCA Pump.  8 mLs of Morphine 2 mg/mL wasted in the stericycle container with [place].

## 2019-01-06 LAB — BASIC METABOLIC PANEL
Anion gap: 8 (ref 5–15)
BUN: <5 mg/dL — ABNORMAL LOW (ref 8–23)
CO2: 28 mmol/L (ref 22–32)
Calcium: 8.9 mg/dL (ref 8.9–10.3)
Chloride: 104 mmol/L (ref 98–111)
Creatinine, Ser: 0.55 mg/dL (ref 0.44–1.00)
GFR calc Af Amer: >60 mL/min (ref >60)
GFR calc non Af Amer: >60 mL/min (ref >60)
Glucose, Bld: 122 mg/dL — ABNORMAL HIGH (ref 70–99)
Potassium: 4.1 mmol/L (ref 3.5–5.1)
Sodium: 140 mmol/L (ref 135–145)

## 2019-01-06 LAB — CBC
HCT: 34.8 % — ABNORMAL LOW (ref 36.0–46.0)
Hemoglobin: 11.1 g/dL — ABNORMAL LOW (ref 12.0–15.0)
MCH: 28.6 pg (ref 26.0–34.0)
MCHC: 31.9 g/dL (ref 30.0–36.0)
MCV: 89.7 fL (ref 80.0–100.0)
Platelets: 237 10*3/uL (ref 150–400)
RBC: 3.88 MIL/uL (ref 3.87–5.11)
RDW: 12.5 % (ref 11.5–15.5)
WBC: 9.5 10*3/uL (ref 4.0–10.5)
nRBC: 0 % (ref 0.0–0.2)

## 2019-01-06 MED ORDER — ENSURE MAX PROTEIN PO LIQD
11.0000 [oz_av] | Freq: Two times a day (BID) | ORAL | Status: DC
  Administered 2019-01-06 – 2019-01-07 (×2): 11 [oz_av] via ORAL
  Administered 2019-01-07: 237 mL via ORAL
  Filled 2019-01-06 (×5): qty 330

## 2019-01-06 MED ORDER — LOPERAMIDE HCL 2 MG PO CAPS
2.0000 mg | ORAL_CAPSULE | Freq: Once | ORAL | Status: AC
  Administered 2019-01-06: 2 mg via ORAL
  Filled 2019-01-06 (×2): qty 1

## 2019-01-06 NOTE — Progress Notes (Signed)
Initial Nutrition Assessment  DOCUMENTATION CODES:   Obesity unspecified  INTERVENTION:   -Educated pt and husband on post-gastrectomy diet; provided "Gastric Surgery Nutrition Therapy" handout from [facility] Nutrition Care Manual with RD contact information -Ensure Max BID, each supplement provides 150 kcals and 30 grams protein  NUTRITION DIAGNOSIS:   Increased nutrient needs related to post-op healing as evidenced by estimated needs.  GOAL:   Patient will meet greater than or equal to 90% of their needs  MONITOR:   PO intake, Supplement acceptance, Diet advancement, Labs, Weight trends, Skin, I & O's  REASON FOR ASSESSMENT:   Consult Diet education  ASSESSMENT:   73 year old female who is referred by Dr. Michail Sermon for a new diagnosis of gastric cancer.  The patient presented with increased heartburn and reflux.  He underwent an EGD on November 19 which showed segmental mild inflammation with erosions in the gastric antrum.  She had a 6 mm nodule in the gastric body which was biopsied.  There were also several small fundic gland polyps in the fundus.  The biopsy of the gastritis was seen to be chronic and active gastritis with no H. pylori.  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  Pt admitted with gastric cancer.   1/16- s/p Procedure(s): Diagnostic laparoscopy, distal gastrectomy 1/19- NGT d/c, remain NPO 1/20- advanced to clear liquids  Reviewed I/O's: +541 ml x 24 hours and +4.8 L since admission  Spoke with pt and husband at bedside. Pt reports feeling well today. She shares that she is tolerating full liquids well, but has been eating slowly due to early satiety. Appetite was good PTA; pt consumed 3 meals per day (breakfast: [place] yogurt and apple and peanut utter; Lunch: salad; Dinner: meat, starch, and vegetable). Pt also admits to eating a lot of sweets (mainly ice cream)  before surgery. She was consuming Boost supplements PTA for extra protein.   Pt denies any wt loss. Noted wt stability per chart review. She also denies any changes in mobility.   Pt is very curious about what she can eat after surgery. Discussed diet progression. Educated pt and husband on post-gastrectomy diet. Discussed small, frequent meals, ways to decrease fiber and sugar in diet, as well as consuming liquids 30-60 minutes after meals. Discussed ways to increase protein in diet. Teachback method used and expect good compliance.   Labs reviewed.   NUTRITION - FOCUSED PHYSICAL EXAM:    Most Recent Value  Orbital Region  No depletion  Upper Arm Region  No depletion  Thoracic and Lumbar Region  No depletion  Buccal Region  No depletion  Temple Region  No depletion  Clavicle Bone Region  No depletion  Clavicle and Acromion Bone Region  No depletion  Scapular Bone Region  No depletion  Dorsal Hand  No depletion  Patellar Region  No depletion  Anterior Thigh Region  No depletion  Posterior Calf Region  No depletion  Edema (RD Assessment)  None  Hair  Reviewed  Eyes  Reviewed  Mouth  Reviewed  Nails  Reviewed       Diet Order:   Diet Order            DIET SOFT Room service appropriate? Yes; Fluid consistency: Thin  Diet effective 1400              EDUCATION NEEDS:   Education needs have been addressed  Skin:  Skin Assessment: Skin Integrity Issues: Skin Integrity  Issues:: Incisions Incisions: closed abdomen  Last BM:  01/05/19  Height:   Ht Readings from Last 1 Encounters:  01/01/19 5' (1.524 m)    Weight:   Wt Readings from Last 1 Encounters:  01/01/19 82.4 kg    Ideal Body Weight:  45.5 kg  BMI:  Body mass index is 35.48 kg/m.  Estimated Nutritional Needs:   Kcal:  1600-1800  Protein:  75-90 grams  Fluid:  1.6-1.8 L    Pia Jedlicka A. Jimmye [place], RD, LDN, CDE Pager: [phone] After hours Pager: [phone]

## 2019-01-06 NOTE — Progress Notes (Signed)
Patient ID: Jane Howe, female   DOB: Jun 28, 1946, 73 y.o.   MRN: [id number] [facility] Surgery Progress Note:   3 Days Post-Op  Subjective: Tolerated clears without n/v.  2 stools.  More flatus.      Objective: Vital signs in last 24 hours: Temp:  [97.7 F (36.5 C)-98.6 F (37 C)] 98.6 F (37 C) (01/21 0409) Pulse Rate:  [79-88] 88 (01/21 0409) Resp:  [7-16] 12 (01/21 0409) BP: (127-143)/(48-61) 143/53 (01/21 0409) SpO2:  [94 %-100 %] 97 % (01/21 0409)  Intake/Output from previous day: 01/20 0701 - 01/21 0700 In: 991.4 [I.V.:991.4] Out: 450 [Urine:450] Intake/Output this shift: No intake/output data recorded.  Physical Exam: A&O x 3. NAD Breathing comfortably abd soft, nt, nd.  Dressing c/d/i.     Lab Results:  Results for orders placed or performed during the hospital encounter of 01/01/19 (from the past 48 hour(s))  CBC     Status: Abnormal   Collection Time: 01/05/19  1:46 AM  Result Value Ref Range   WBC 9.4 4.0 - 10.5 K/uL   RBC 3.72 (L) 3.87 - 5.11 MIL/uL   Hemoglobin 10.9 (L) 12.0 - 15.0 g/dL   HCT 33.6 (L) 36.0 - 46.0 %   MCV 90.3 80.0 - 100.0 fL   MCH 29.3 26.0 - 34.0 pg   MCHC 32.4 30.0 - 36.0 g/dL   RDW 12.5 11.5 - 15.5 %   Platelets 195 150 - 400 K/uL   nRBC 0.0 0.0 - 0.2 %    Comment: Performed at [facility], [place] [address], [place], [place] [address]  Basic metabolic panel     Status: Abnormal   Collection Time: 01/05/19  1:46 AM  Result Value Ref Range   Sodium 139 135 - 145 mmol/L   Potassium 4.0 3.5 - 5.1 mmol/L   Chloride 103 98 - 111 mmol/L   CO2 27 22 - 32 mmol/L   Glucose, Bld 121 (H) 70 - 99 mg/dL   BUN <5 (L) 8 - 23 mg/dL   Creatinine, Ser 0.57 0.44 - 1.00 mg/dL   Calcium 8.6 (L) 8.9 - 10.3 mg/dL   GFR calc non Af Amer >60 >60 mL/min   GFR calc Af Amer >60 >60 mL/min   Anion gap 9 5 - 15    Comment: Performed at [facility], [place] [address] [place], [place] [address]  CBC     Status: Abnormal   Collection  Time: 01/06/19  5:11 AM  Result Value Ref Range   WBC 9.5 4.0 - 10.5 K/uL   RBC 3.88 3.87 - 5.11 MIL/uL   Hemoglobin 11.1 (L) 12.0 - 15.0 g/dL   HCT 34.8 (L) 36.0 - 46.0 %   MCV 89.7 80.0 - 100.0 fL   MCH 28.6 26.0 - 34.0 pg   MCHC 31.9 30.0 - 36.0 g/dL   RDW 12.5 11.5 - 15.5 %   Platelets 237 150 - 400 K/uL   nRBC 0.0 0.0 - 0.2 %    Comment: Performed at [facility], [place] [address], [place], [place] [address]  Basic metabolic panel     Status: Abnormal   Collection Time: 01/06/19  5:11 AM  Result Value Ref Range   Sodium 140 135 - 145 mmol/L   Potassium 4.1 3.5 - 5.1 mmol/L   Chloride 104 98 - 111 mmol/L   CO2 28 22 - 32 mmol/L   Glucose, Bld 122 (H) 70 - 99 mg/dL  BUN <5 (L) 8 - 23 mg/dL   Creatinine, Ser 0.55 0.44 - 1.00 mg/dL   Calcium 8.9 8.9 - 10.3 mg/dL   GFR calc non Af Amer >60 >60 mL/min   GFR calc Af Amer >60 >60 mL/min   Anion gap 8 5 - 15    Comment: Performed at [place] [address], [place], [place] [address]    Radiology/Results: No results found.  Anti-infectives: Anti-infectives (From admission, onward)   Start     Dose/Rate Route Frequency Ordered Stop   01/01/19 1730  ceFAZolin (ANCEF) IVPB 2g/100 mL premix     2 g 200 mL/hr over 30 Minutes Intravenous Every 8 hours 01/01/19 1657 01/01/19 1848   01/01/19 0630  ceFAZolin (ANCEF) IVPB 2g/100 mL premix     2 g 200 mL/hr over 30 Minutes Intravenous On call to O.R. 01/01/19 0620 01/01/19 0857      Assessment/Plan: Problem List: Patient Active Problem List   Diagnosis Date Noted  . Malignant neoplasm of antrum of stomach ([place]) 01/01/2019  . Genetic testing 12/29/2018  . Gastric cancer ([place]) 12/15/2018  . Family history of breast cancer   . Family history of colon cancer   . Family history of lung cancer   . Family history of melanoma   . Biliary dyskinesia 11/20/2016  . OA (osteoarthritis) of knee 07/16/2016    Advance diet.  Continue ambulating Nutrition  consult Hopefully home in next 1-2 days if caloric intake is adequate.     LOS: 5 days   01/06/2019 8:02 AM

## 2019-01-06 NOTE — Plan of Care (Signed)
  Problem: Clinical Measurements: Goal: Ability to maintain clinical measurements within normal limits will improve 01/06/2019 0230 by Corinna Lines, RN Outcome: Progressing 01/06/2019 0014 by Corinna Lines, RN Outcome: Progressing Goal: Will remain free from infection 01/06/2019 0230 by Corinna Lines, RN Outcome: Progressing 01/06/2019 0014 by Corinna Lines, RN Outcome: Progressing   Problem: Activity: Goal: Risk for activity intolerance will decrease Outcome: Progressing   Problem: Nutrition: Goal: Adequate nutrition will be maintained 01/06/2019 0230 by Corinna Lines, RN Outcome: Progressing 01/06/2019 0014 by Corinna Lines, RN Outcome: Progressing   Problem: Coping: Goal: Level of anxiety will decrease Outcome: Progressing   Problem: Elimination: Goal: Will not experience complications related to bowel motility 01/06/2019 0230 by Corinna Lines, RN Outcome: Progressing 01/06/2019 0014 by Corinna Lines, RN Outcome: Progressing Goal: Will not experience complications related to urinary retention 01/06/2019 0230 by Corinna Lines, RN Outcome: Progressing 01/06/2019 0014 by Corinna Lines, RN Outcome: Progressing   Problem: Pain Managment: Goal: General experience of comfort will improve 01/06/2019 0230 by Corinna Lines, RN Outcome: Progressing 01/06/2019 0014 by Corinna Lines, RN Outcome: Progressing   Problem: Safety: Goal: Ability to remain free from injury will improve 01/06/2019 0230 by Corinna Lines, RN Outcome: Progressing 01/06/2019 0014 by Corinna Lines, RN Outcome: Progressing

## 2019-01-07 MED ORDER — PANTOPRAZOLE SODIUM 40 MG PO TBEC
40.0000 mg | DELAYED_RELEASE_TABLET | Freq: Every day | ORAL | Status: DC
  Administered 2019-01-07: 40 mg via ORAL
  Filled 2019-01-07: qty 1

## 2019-01-07 MED ORDER — TRAMADOL HCL 50 MG PO TABS: 50.0000 mg | ORAL_TABLET | Freq: Four times a day (QID) | ORAL | Status: DC | PRN

## 2019-01-07 MED ORDER — LOPERAMIDE HCL 2 MG PO CAPS: 2.0000 mg | ORAL_CAPSULE | ORAL | Status: DC | PRN

## 2019-01-07 MED ORDER — METHOCARBAMOL 500 MG PO TABS: 500.0000 mg | ORAL_TABLET | Freq: Three times a day (TID) | ORAL | Status: DC | PRN

## 2019-01-07 MED ORDER — OXYCODONE HCL 5 MG PO TABS: 5.0000 mg | ORAL_TABLET | ORAL | Status: DC | PRN

## 2019-01-07 NOTE — Plan of Care (Signed)
  Problem: Clinical Measurements: Goal: Will remain free from infection Outcome: Progressing   Problem: Nutrition: Goal: Adequate nutrition will be maintained Outcome: Progressing   Problem: Coping: Goal: Level of anxiety will decrease Outcome: Progressing   Problem: Elimination: Goal: Will not experience complications related to bowel motility Outcome: Progressing Goal: Will not experience complications related to urinary retention Outcome: Progressing   Problem: Pain Managment: Goal: General experience of comfort will improve Outcome: Progressing   Problem: Safety: Goal: Ability to remain free from injury will improve Outcome: Progressing   

## 2019-01-07 NOTE — Progress Notes (Signed)
Patient ID: Jane Howe, female   DOB: 1946-03-09, 73 y.o.   MRN: [id number] [facility] Surgery Progress Note:   3 Days Post-Op  Subjective: Having some nausea, but no emesis.  Having some liquid stools.  No pain.    Objective: Vital signs in last 24 hours: Temp:  [98.2 F (36.8 C)-98.4 F (36.9 C)] 98.2 F (36.8 C) (01/22 0355) Pulse Rate:  [74-85] 82 (01/22 0355) Resp:  [16-18] 16 (01/22 0355) BP: (128-147)/(58-62) 147/61 (01/22 0355) SpO2:  [96 %-99 %] 98 % (01/22 0355)  Intake/Output from previous day: 01/21 0701 - 01/22 0700 In: 1500 [P.O.:1500] Out: 1 [Urine:1] Intake/Output this shift: Total I/O In: 240 [P.O.:240] Out: -   Physical Exam: A&O x 3. NAD Breathing comfortably abd soft, nt, nd.  Dressing c/d/i.     Lab Results:  Results for orders placed or performed during the hospital encounter of 01/01/19 (from the past 48 hour(s))  CBC     Status: Abnormal   Collection Time: 01/06/19  5:11 AM  Result Value Ref Range   WBC 9.5 4.0 - 10.5 K/uL   RBC 3.88 3.87 - 5.11 MIL/uL   Hemoglobin 11.1 (L) 12.0 - 15.0 g/dL   HCT 34.8 (L) 36.0 - 46.0 %   MCV 89.7 80.0 - 100.0 fL   MCH 28.6 26.0 - 34.0 pg   MCHC 31.9 30.0 - 36.0 g/dL   RDW 12.5 11.5 - 15.5 %   Platelets 237 150 - 400 K/uL   nRBC 0.0 0.0 - 0.2 %    Comment: Performed at [facility], [place] [address], [place], [place] [address]  Basic metabolic panel     Status: Abnormal   Collection Time: 01/06/19  5:11 AM  Result Value Ref Range   Sodium 140 135 - 145 mmol/L   Potassium 4.1 3.5 - 5.1 mmol/L   Chloride 104 98 - 111 mmol/L   CO2 28 22 - 32 mmol/L   Glucose, Bld 122 (H) 70 - 99 mg/dL   BUN <5 (L) 8 - 23 mg/dL   Creatinine, Ser 0.55 0.44 - 1.00 mg/dL   Calcium 8.9 8.9 - 10.3 mg/dL   GFR calc non Af Amer >60 >60 mL/min   GFR calc Af Amer >60 >60 mL/min   Anion gap 8 5 - 15    Comment: Performed at [place] [address], [place], [place] [address]    Radiology/Results: No  results found.  Anti-infectives: Anti-infectives (From admission, onward)   Start     Dose/Rate Route Frequency Ordered Stop   01/01/19 1730  ceFAZolin (ANCEF) IVPB 2g/100 mL premix     2 g 200 mL/hr over 30 Minutes Intravenous Every 8 hours 01/01/19 1657 01/01/19 1848   01/01/19 0630  ceFAZolin (ANCEF) IVPB 2g/100 mL premix     2 g 200 mL/hr over 30 Minutes Intravenous On call to O.R. 01/01/19 0620 01/01/19 0857      Assessment/Plan: Problem List: Patient Active Problem List   Diagnosis Date Noted  . 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 Genetic testing 12/29/2018  . Gastric cancer ([place]) 12/15/2018  . Family history of breast cancer   . Family history of colon cancer   . Family history of lung cancer   . Family history of melanoma   . Biliary dyskinesia 11/20/2016  . OA (osteoarthritis) of knee 07/16/2016   Diet as tolerated. Imodium. Recheck labs in am. Try oral antiemetic.  Continue ambulating Nutrition consult Hopefully home tomorrow.    LOS: 6 days   01/07/2019 10:54 AM

## 2019-01-08 LAB — CBC
HCT: 35.8 % — ABNORMAL LOW (ref 36.0–46.0)
Hemoglobin: 11.6 g/dL — ABNORMAL LOW (ref 12.0–15.0)
MCH: 28.8 pg (ref 26.0–34.0)
MCHC: 32.4 g/dL (ref 30.0–36.0)
MCV: 88.8 fL (ref 80.0–100.0)
Platelets: 289 10*3/uL (ref 150–400)
RBC: 4.03 MIL/uL (ref 3.87–5.11)
RDW: 12.3 % (ref 11.5–15.5)
WBC: 11.5 10*3/uL — ABNORMAL HIGH (ref 4.0–10.5)
nRBC: 0 % (ref 0.0–0.2)

## 2019-01-08 LAB — COMPREHENSIVE METABOLIC PANEL
ALT: 23 U/L (ref 0–44)
AST: 20 U/L (ref 15–41)
Albumin: 2.9 g/dL — ABNORMAL LOW (ref 3.5–5.0)
Alkaline Phosphatase: 60 U/L (ref 38–126)
Anion gap: 10 (ref 5–15)
BUN: 13 mg/dL (ref 8–23)
CO2: 27 mmol/L (ref 22–32)
Calcium: 9 mg/dL (ref 8.9–10.3)
Chloride: 103 mmol/L (ref 98–111)
Creatinine, Ser: 0.5 mg/dL (ref 0.44–1.00)
GFR calc Af Amer: >60 mL/min (ref >60)
GFR calc non Af Amer: >60 mL/min (ref >60)
Glucose, Bld: 113 mg/dL — ABNORMAL HIGH (ref 70–99)
Potassium: 3.6 mmol/L (ref 3.5–5.1)
Sodium: 140 mmol/L (ref 135–145)
Total Bilirubin: 0.4 mg/dL (ref 0.3–1.2)
Total Protein: 5.8 g/dL — ABNORMAL LOW (ref 6.5–8.1)

## 2019-01-08 MED ORDER — ONDANSETRON 4 MG PO TBDP: 4.0000 mg | ORAL_TABLET | Freq: Four times a day (QID) | ORAL | 2 refills | Status: DC | PRN

## 2019-01-08 MED ORDER — TRAMADOL HCL 50 MG PO TABS: 50.0000 mg | ORAL_TABLET | Freq: Four times a day (QID) | ORAL | 1 refills | Status: DC | PRN

## 2019-01-08 MED ORDER — PROCHLORPERAZINE MALEATE 10 MG PO TABS: 10.0000 mg | ORAL_TABLET | Freq: Four times a day (QID) | ORAL | 0 refills | Status: DC | PRN

## 2019-01-08 NOTE — Progress Notes (Signed)
Discharge  Pt was able to participate in discharge teaching with husband at the bedside. NA was able to remove PIV and pt allowed to dress. Staples removed without incident and covered with steri-strips as ordered. Pt reminded of medication changes and followup appts. Paper scripts given to pt. Pt taken out with wheelchair by volunteer services.

## 2019-01-08 NOTE — Plan of Care (Signed)
  Problem: Education: Goal: Knowledge of General Education information will improve Description Including pain rating scale, medication(s)/side effects and non-pharmacologic comfort measures Outcome: Progressing   Problem: Health Behavior/Discharge Planning: Goal: Ability to manage health-related needs will improve Outcome: Progressing   Problem: Clinical Measurements: Goal: Will remain free from infection Outcome: Progressing   Problem: Activity: Goal: Risk for activity intolerance will decrease Outcome: Progressing   Problem: Nutrition: Goal: Adequate nutrition will be maintained Outcome: Progressing   Problem: Elimination: Goal: Will not experience complications related to bowel motility Outcome: Progressing Goal: Will not experience complications related to urinary retention Outcome: Progressing   Problem: Pain Managment: Goal: General experience of comfort will improve Outcome: Progressing   Problem: Safety: Goal: Ability to remain free from injury will improve Outcome: Progressing

## 2019-01-08 NOTE — Discharge Summary (Signed)
Physician Discharge Summary  Patient ID: Jane Howe MRN: [id number] DOB/AGE: 1946-03-06 73 y.o.  Admit date: 01/01/2019 Discharge date: 01/08/2019  Admission Diagnoses: Patient Active Problem List   Diagnosis Date Noted  . 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 Genetic testing 12/29/2018  . Gastric cancer ([place]) 12/15/2018  . Family history of breast cancer   . Family history of colon cancer   . Family history of lung cancer   . Family history of melanoma   . Biliary dyskinesia 11/20/2016  . OA (osteoarthritis) of knee 07/16/2016    Discharge Diagnoses:  Active Problems:   Malignant neoplasm of antrum of stomach ([facility]) and same as above.    Discharged Condition: stable  Hospital Course:  Pt was admitted to the floor following a diagnostic laparoscopy and distal gastrectomy for adenocarcinoma of the stomach 01/01/2018.  She had an onQ pain pump and a PCA.  Her main complaint originally was a sore throat from the NGT.  She developed significant nausea and her NGT was left in place an additional day.  She tolerated popsicles.  She started passing some gas and her diet was advanced.  PCA was discontinued.  She required very little pain medication.  She had some nausea the first few days that her diet was advanced, but she was keeping down soft food and full liquids.  She had 1-2 days of loose stools, but these resolved as her po intake improved.  She was able to void without foley and was ambulatory.  She was discharged in stable condition.    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  HCT 35.8 prior to d/c.  Cr 0.5  Treatments: surgery: see above.    Discharge Exam: Blood pressure 131/61, pulse 75, temperature 98 F (36.7 C), temperature source Oral, resp. rate 18, height 5' (1.524 m), weight 82.4 kg, SpO2 97 %. General appearance: alert, cooperative and no distress Resp: breathing comfortably GI:  soft, non distended, approp tender.  no erythema or drainage.   Extremities: extremities normal, atraumatic, no cyanosis or edema  Disposition: Discharge disposition: 01-Home or Self Care       Discharge Instructions    Call MD for:  difficulty breathing, headache or visual disturbances   Complete by:  As directed    Call MD for:  hives   Complete by:  As directed    Call MD for:  persistant dizziness or light-headedness   Complete by:  As directed    Call MD for:  persistant nausea and vomiting   Complete by:  As directed    Call MD for:  redness, tenderness, or signs of infection (pain, swelling, redness, odor or green/yellow discharge around incision site)   Complete by:  As directed    Call MD for:  severe uncontrolled pain   Complete by:  As directed    Call MD for:  temperature >100.4   Complete by:  As directed    Diet - low sodium heart healthy   Complete by:  As directed    Increase activity slowly   Complete by:  As directed      Allergies as of 01/08/2019   No Known Allergies     Medication List    STOP taking these medications   b complex vitamins tablet   Biotin 5 MG Tabs   COLACE 100 MG capsule Generic drug:  docusate sodium   magnesium oxide 400 MG tablet  Commonly known as:  MAG-OX   multivitamin with minerals Tabs tablet   polyethylene glycol packet Commonly known as:  MIRALAX / GLYCOLAX   PROBIOTIC DAILY PO     TAKE these medications   ALPRAZolam 0.25 MG tablet Commonly known as:  XANAX Take 0.25 mg by mouth at bedtime as needed for sleep.   calcium carbonate 1500 (600 Ca) MG Tabs tablet Commonly known as:  OSCAL Take 600 mg of elemental calcium by mouth at bedtime.   cyclobenzaprine 10 MG tablet Commonly known as:  FLEXERIL Take 1 tablet (10 mg total) by mouth 3 (three) times daily as needed for muscle spasms. What changed:  when to take this   DULoxetine 30 MG capsule Commonly known as:  CYMBALTA Take 90 mg by mouth daily.    Melatonin 5 MG Tabs Take 5 mg by mouth at bedtime.   meloxicam 15 MG tablet Commonly known as:  MOBIC Take 15 mg by mouth every morning.   omeprazole 20 MG capsule Commonly known as:  PRILOSEC Take 20 mg by mouth 2 (two) times daily as needed (heartburn).   ondansetron 4 MG disintegrating tablet Commonly known as:  ZOFRAN-ODT Take 1 tablet (4 mg total) by mouth every 6 (six) hours as needed for nausea or vomiting. What changed:  when to take this   PHENERGAN PO Take 12.5 mg by mouth every 6 (six) hours as needed (nausea / vomiting).   pravastatin 40 MG tablet Commonly known as:  PRAVACHOL Take 40 mg by mouth at bedtime.   prochlorperazine 10 MG tablet Commonly known as:  COMPAZINE Take 1 tablet (10 mg total) by mouth every 6 (six) hours as needed for nausea or vomiting (Use for nausea and / or vomiting unresolved with ondansetron (Zofran).).   propranolol 10 MG tablet Commonly known as:  INDERAL Take 10 mg by mouth daily as needed (non-essential tremors).   traMADol 50 MG tablet Commonly known as:  ULTRAM Take 1 tablet (50 mg total) by mouth every 6 (six) hours as needed (mild pain).   traZODone 100 MG tablet Commonly known as:  DESYREL Take 100 mg by mouth at bedtime.   Vitamin D3 125 MCG (5000 UT) Tabs Take 5,000 Units by mouth daily.      Follow-up Information    [name], MD In 2 weeks.   Specialty:  General Surgery Contact information: [address] [place] [address] [phone]           Signed: [name] 01/08/2019, 10:04 AM

## 2019-01-12 ENCOUNTER — Other Ambulatory Visit: Payer: Self-pay | Admitting: *Deleted

## 2019-01-12 NOTE — Patient Outreach (Signed)
[place] [facility]) Care Management  01/12/2019  MARGAURITE SALIDO Apr 30, 1946 [id number]   EMMI-general discharge-[facility] RED ON EMMI ALERT Day # 1 Date: Friday 01/09/2019 [place] Reason: [name] prescriptions?Yes Last admission 01/01/2019 to 01/08/2019 dx malignant neoplasm of antrum of stomach   Insurance: Medicare  [facility] admissions x1  ED visits x1  in the last 6 months    Outreach attempt # 1 No answer. [facility] RN CM left HIPAA compliant voicemail message along with CM's contact info.   Plan: [facility] RN CM sent an unsuccessful outreach letter and scheduled this patient for another call attempt within 4 business days   Talicia Sui L. Lavina Hamman, RN, BSN, [place] Coordinator Office number [phone] Mobile number [phone]  Main [facility] number [phone] Fax number [phone]

## 2019-01-13 ENCOUNTER — Other Ambulatory Visit: Payer: Self-pay | Admitting: *Deleted

## 2019-01-13 NOTE — Patient Outreach (Signed)
[place] [facility]) Care Management  01/13/2019  Jane Howe 1946-08-26 [id number]   Note opened in error  Denissa Cozart L. Lavina Hamman, RN, BSN, [place] Coordinator Office number [phone] Mobile number [phone]  Main [facility] number [phone] Fax number [phone]

## 2019-01-14 ENCOUNTER — Other Ambulatory Visit: Payer: Self-pay | Admitting: *Deleted

## 2019-01-14 ENCOUNTER — Ambulatory Visit: Payer: Self-pay | Admitting: *Deleted

## 2019-01-14 NOTE — Patient Outreach (Signed)
[place] [facility]) Care Management  01/14/2019  [name] 10-06-1946 [id number]   EMMI-general discharge-[facility] RED ON EMMI ALERT Day # 1 Date: Friday 01/09/2019 [place] Reason: [name] prescriptions?Yes Last admission 01/01/2019 to 01/08/2019 dx malignant neoplasm of antrum of stomach   Insurance: Medicare  [facility] admissions x1  ED visits x1  in the last 6 months    Outreach attempt # 2 No answer. [facility] RN CM left HIPAA compliant voicemail message along with CM's contact info.   Plan: [facility] RN CM scheduled this patient for another call attempt within 4 business days   Kynlie Jane L. Lavina Hamman, RN, BSN, [place] Coordinator Office number [phone] Mobile number [phone]  Main [facility] number [phone] Fax number [phone]

## 2019-01-15 ENCOUNTER — Ambulatory Visit: Payer: Self-pay | Admitting: *Deleted

## 2019-01-16 ENCOUNTER — Other Ambulatory Visit: Payer: Self-pay | Admitting: *Deleted

## 2019-01-16 NOTE — Patient Outreach (Signed)
[place] [facility]) Care Management  01/16/2019  Jane Howe [birth year] [id number]   EMMI-general discharge-[facility] RED ON EMMI ALERT Day #1 Date:Friday 01/09/2019 1324 [place] Reason:Unfilled prescriptions?Yes Last admission 01/01/2019 to 01/08/2019 dx malignant neoplasm of antrum of stomach  Insurance:Medicare [facility] visits x1in the last 6 months    Outreach attempt # 3   Patient is able to verify HIPAA Reviewed and addressed EMMI red alert/referral to [facility] with patient  EMMI   Jane Howe reports answering the EMMI question as "no" but She then repports "something wrong" She reports the automated system asked her the question again She reports replying "no" frequently and then finally responded "yes" in order for the automated system to stop asking the same question She reports her answer to the EMMI question should have been "no" not Yes   She confirms she is doing well with her stools and appetite    Social: Jane Howe is retired and lives with her husband Jane Howe.  She is ondependent in all her care needs, without issues getting to medical appointments She has support of daughter Jane Howe also  Conditions: biliary dyskinesia, gastric cancer, OA, pain in left knee hx of righ TKR lumbar spondylolisthesis  Medications: She denies concerns with taking medications as prescribed, affording medications, side effects of medications and questions about medications  Appointments: dr Betsy Coder oncology on 01/20/19 1530   Advance Directives: has a POA and living will   Consent: [facility] RN CM reviewed [facility] services with patient. Patient gave verbal consent for services.  Plan: [facility] RN CM will close case at this time as patient has been assessed and no needs identified/needs resolved.   Pt encouraged to return a call to [facility] RN CM prn  .  Jane Burkhammer L. Lavina Hamman, RN, BSN, [place] Coordinator Office number [phone] Mobile number [phone]  Main [facility] number [phone] Fax number [phone]

## 2019-01-19 ENCOUNTER — Telehealth: Payer: Self-pay | Admitting: *Deleted

## 2019-01-19 NOTE — Telephone Encounter (Signed)
Patient left VM that she is constipated and has not had a BM in 5 days. Called patient back and left VM to start Senna-S taking 1-2 tabs twice daily and add MiraLax 17 grams to glass of liquid every day. If no results in a day, try an OTC Fleet enema. Encouraged her to contact her surgeon for continued constipation issues since she has not had any chemotherapy that could cause this issue.

## 2019-01-20 ENCOUNTER — Telehealth: Payer: Self-pay | Admitting: Oncology

## 2019-01-20 ENCOUNTER — Inpatient Hospital Stay: Payer: Medicare Other | Attending: Oncology | Admitting: Oncology

## 2019-01-20 VITALS — BP 104/73 | HR 74 | Temp 98.1°F | Resp 18 | Ht 60.0 in | Wt 158.8 lb

## 2019-01-20 DIAGNOSIS — C162 Malignant neoplasm of body of stomach: Secondary | ICD-10-CM | POA: Insufficient documentation

## 2019-01-20 DIAGNOSIS — K5900 Constipation, unspecified: Secondary | ICD-10-CM | POA: Insufficient documentation

## 2019-01-20 NOTE — Progress Notes (Signed)
  [place] OFFICE PROGRESS NOTE   Diagnosis: Gastric cancer  INTERVAL HISTORY:   Ms. Perno returns for a scheduled visit.  She underwent a distal gastrectomy and diagnostic laparoscopy 01/01/2019.  There was no evidence of carcinomatosis.  A mass was noted in the distal stomach.  No enlarged lymph nodes. 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  No lymphovascular invasion.  The resection margins are negative.  She complains of constipation despite MiraLAX and magnesium citrate.  She had a partial response to a fleets enema today.  Objective:  Vital signs in last 24 hours:  Blood pressure 104/73, pulse 74, temperature 98.1 F (36.7 C), temperature source Oral, resp. rate 18, height 5' (1.524 m), weight 158 lb 12.8 oz (72 kg), SpO2 97 %.     Resp: Lungs clear bilaterally Cardio: Regular rate and rhythm GI: Soft, healed midline incision with Steri-Strips in place, no hepatosplenomegaly, mild diffuse tenderness Vascular: No leg edema    Medications: I have reviewed the patient's current medications.   Assessment/Plan: 1. Gastric cancer, stage I (T1bN0)  Biopsy of a gastric body nodule on 11/04/2018 confirmed poorly differentiated carcinoma with signet cell differentiation  CTs of the chest, abdomen, and pelvis on 11/17/2018 revealed no gastric mass and no evidence of metastatic disease, tiny indeterminate lung nodules  EUS on 11/27/2018- ulcer on the greater curvature of the gastric body measuring 6 mm, tattooed, T1 versus early T2, N0  Distal gastrectomy 01/01/2019- stage I (T1bN0 (poorly differentiated signet ring carcinoma, tumor invades submucosa, 11- lymph nodes, resection margins negative 2. Gastritis noted on endoscopy 11/04/2018, biopsy-chronic inactive gastritis, negative for Helicobacter 3. Nausea and "heartburn"-  reflux? 4. 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 Family history of multiple cancers    Disposition: Ms. Wurtz is recovering from a distal gastrectomy.  She has been diagnosed with stage I gastric cancer.  I do not recommend adjuvant therapy.  She has a good prognosis for a long-term disease-free survival.  She will return for an office visit in 6 months.  She will follow-up with Dr. Barry Dienes in the interim.  Betsy Coder, MD  01/20/2019  4:04 PM

## 2019-01-20 NOTE — Telephone Encounter (Signed)
Gave patient avs report and appointments for August.  °

## 2019-01-22 DIAGNOSIS — K5900 Constipation, unspecified: Secondary | ICD-10-CM | POA: Diagnosis not present

## 2019-01-22 DIAGNOSIS — C169 Malignant neoplasm of stomach, unspecified: Secondary | ICD-10-CM | POA: Diagnosis not present

## 2019-01-22 DIAGNOSIS — R110 Nausea: Secondary | ICD-10-CM | POA: Diagnosis not present

## 2019-03-09 DIAGNOSIS — Z0000 Encounter for general adult medical examination without abnormal findings: Secondary | ICD-10-CM | POA: Diagnosis not present

## 2019-03-09 DIAGNOSIS — R7309 Other abnormal glucose: Secondary | ICD-10-CM | POA: Diagnosis not present

## 2019-03-09 DIAGNOSIS — E782 Mixed hyperlipidemia: Secondary | ICD-10-CM | POA: Diagnosis not present

## 2019-03-09 DIAGNOSIS — C169 Malignant neoplasm of stomach, unspecified: Secondary | ICD-10-CM | POA: Diagnosis not present

## 2019-03-09 DIAGNOSIS — R110 Nausea: Secondary | ICD-10-CM | POA: Diagnosis not present

## 2019-03-09 DIAGNOSIS — R251 Tremor, unspecified: Secondary | ICD-10-CM | POA: Diagnosis not present

## 2019-03-09 DIAGNOSIS — Z01810 Encounter for preprocedural cardiovascular examination: Secondary | ICD-10-CM | POA: Diagnosis not present

## 2019-03-09 DIAGNOSIS — I700 Atherosclerosis of aorta: Secondary | ICD-10-CM | POA: Diagnosis not present

## 2019-03-09 DIAGNOSIS — Z791 Long term (current) use of non-steroidal anti-inflammatories (NSAID): Secondary | ICD-10-CM | POA: Diagnosis not present

## 2019-03-09 DIAGNOSIS — G479 Sleep disorder, unspecified: Secondary | ICD-10-CM | POA: Diagnosis not present

## 2019-03-09 DIAGNOSIS — E559 Vitamin D deficiency, unspecified: Secondary | ICD-10-CM | POA: Diagnosis not present

## 2019-03-09 DIAGNOSIS — F3342 Major depressive disorder, recurrent, in full remission: Secondary | ICD-10-CM | POA: Diagnosis not present

## 2019-03-09 DIAGNOSIS — K219 Gastro-esophageal reflux disease without esophagitis: Secondary | ICD-10-CM | POA: Diagnosis not present

## 2019-03-12 DIAGNOSIS — K5900 Constipation, unspecified: Secondary | ICD-10-CM | POA: Diagnosis not present

## 2019-03-12 DIAGNOSIS — E782 Mixed hyperlipidemia: Secondary | ICD-10-CM | POA: Diagnosis not present

## 2019-03-12 DIAGNOSIS — F3342 Major depressive disorder, recurrent, in full remission: Secondary | ICD-10-CM | POA: Diagnosis not present

## 2019-03-12 DIAGNOSIS — R251 Tremor, unspecified: Secondary | ICD-10-CM | POA: Diagnosis not present

## 2019-03-12 DIAGNOSIS — G479 Sleep disorder, unspecified: Secondary | ICD-10-CM | POA: Diagnosis not present

## 2019-03-12 DIAGNOSIS — Z0000 Encounter for general adult medical examination without abnormal findings: Secondary | ICD-10-CM | POA: Diagnosis not present

## 2019-03-12 DIAGNOSIS — I700 Atherosclerosis of aorta: Secondary | ICD-10-CM | POA: Diagnosis not present

## 2019-03-12 DIAGNOSIS — R7301 Impaired fasting glucose: Secondary | ICD-10-CM | POA: Diagnosis not present

## 2019-03-12 DIAGNOSIS — M8589 Other specified disorders of bone density and structure, multiple sites: Secondary | ICD-10-CM | POA: Diagnosis not present

## 2019-03-12 DIAGNOSIS — C169 Malignant neoplasm of stomach, unspecified: Secondary | ICD-10-CM | POA: Diagnosis not present

## 2019-03-12 DIAGNOSIS — M6089 Other myositis, multiple sites: Secondary | ICD-10-CM | POA: Diagnosis not present

## 2019-03-12 DIAGNOSIS — M351 Other overlap syndromes: Secondary | ICD-10-CM | POA: Diagnosis not present

## 2019-05-19 DIAGNOSIS — L570 Actinic keratosis: Secondary | ICD-10-CM | POA: Diagnosis not present

## 2019-05-19 DIAGNOSIS — Z85828 Personal history of other malignant neoplasm of skin: Secondary | ICD-10-CM | POA: Diagnosis not present

## 2019-05-19 DIAGNOSIS — L650 Telogen effluvium: Secondary | ICD-10-CM | POA: Diagnosis not present

## 2019-05-27 DIAGNOSIS — Z20828 Contact with and (suspected) exposure to other viral communicable diseases: Secondary | ICD-10-CM | POA: Diagnosis not present

## 2019-06-08 DIAGNOSIS — M79672 Pain in left foot: Secondary | ICD-10-CM | POA: Diagnosis not present

## 2019-07-07 ENCOUNTER — Other Ambulatory Visit: Payer: Self-pay | Admitting: Family Medicine

## 2019-07-07 DIAGNOSIS — Z1231 Encounter for screening mammogram for malignant neoplasm of breast: Secondary | ICD-10-CM

## 2019-07-21 ENCOUNTER — Inpatient Hospital Stay: Payer: Medicare Other | Attending: Oncology | Admitting: Oncology

## 2019-07-21 ENCOUNTER — Ambulatory Visit: Payer: Medicare Other | Admitting: Nutrition

## 2019-07-21 ENCOUNTER — Other Ambulatory Visit: Payer: Self-pay

## 2019-07-21 VITALS — BP 135/67 | HR 59 | Temp 98.7°F | Resp 18 | Ht 60.0 in | Wt 127.2 lb

## 2019-07-21 DIAGNOSIS — R634 Abnormal weight loss: Secondary | ICD-10-CM | POA: Insufficient documentation

## 2019-07-21 DIAGNOSIS — C162 Malignant neoplasm of body of stomach: Secondary | ICD-10-CM | POA: Diagnosis not present

## 2019-07-21 DIAGNOSIS — R911 Solitary pulmonary nodule: Secondary | ICD-10-CM | POA: Diagnosis not present

## 2019-07-21 NOTE — Progress Notes (Signed)
  [facility] OFFICE PROGRESS NOTE   Diagnosis: Gastric cancer  INTERVAL HISTORY:   Ms. Wiatrek returns as scheduled.  She has early satiety.  She eats approximately 4 times per day.  No dysphasia.  She has chronic arthritis pain.  She has noted a "lump "in the right upper back.  Objective:  Vital signs in last 24 hours:  Blood pressure 135/67, pulse (!) 59, temperature 98.7 F (37.1 C), temperature source Oral, resp. rate 18, height 5' (1.524 m), weight 127 lb 3.2 oz (57.7 kg), SpO2 100 %.    HEENT: Neck without mass Lymphatics: No cervical, supraclavicular, axillary, or inguinal nodes GI: No hepatomegaly, no mass, nontender Vascular: No leg edema  Musculoskeletal: Slight prominence of soft tissue at the right compared to the left upper back, medial to the scapula.  No discrete mass.   Medications: I have reviewed the patient's current medications.   Assessment/Plan: 1. Gastric cancer, stage I (T1bN0)  Biopsy of a gastric body nodule on 11/04/2018 confirmed poorly differentiated carcinoma with signet cell differentiation  CTs of the chest, abdomen, and pelvis on 11/17/2018 revealed no gastric mass and no evidence of metastatic disease, tiny indeterminate lung nodules  EUS on 11/27/2018- ulcer on the greater curvature of the gastric body measuring 6 mm, tattooed, T1 versus early T2, N0  Distal gastrectomy 01/01/2019- stage I (T1bN0 (poorly differentiated signet ring carcinoma, tumor invades submucosa, 11- lymph nodes, resection margins negative 2. Gastritis noted on endoscopy 11/04/2018, biopsy-chronic inactive gastritis, negative for Helicobacter 3. Nausea and "heartburn"- reflux? 4. 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 Family history of multiple cancers 6. Weight loss following distal gastrectomy-nutrition referral made 07/21/2019    Disposition: Ms. Settlemyre is in clinical remission from gastric cancer.  She has  lost a significant amount of weight since she was here in February.  I suspect this is related to early satiety following the gastrectomy procedure.  I have a low clinical suspicion for recurrence of gastric cancer.  Ms. Weathersby will meet with the Cancer center nutritionist today.  I will forward my note to Dr. Barry Dienes to get her thoughts.  Ms. Nissan will return for an office visit in approximately 6 weeks.  Betsy Coder, MD  07/21/2019  1:08 PM

## 2019-07-21 NOTE — Progress Notes (Signed)
Patient referred by Dr. Benay Spice after his follow-up visit secondary to severe weight loss.  Patient is a 73 year old female diagnosed with stage I gastric cancer status post distal gastrectomy in January 2020.  Past medical history includes osteoarthritis, lupus, hyperlipidemia, GERD, and depression.  Medications include Xanax, vitamin D3, Cymbalta, and Zofran,  Labs include glucose 113 and albumin 2.9.  Height: 5 feet 0 inches. Weight: 127.2 pounds. Usual body weight: 182 pounds in January 2020, 159 pounds January 20, 2019. BMI: 24.84.  Estimated nutrition needs: 1900- 2100 cal, 100-120 g protein, greater than 2.0 L fluid  Patient describes ongoing nausea with occasional vomiting. She reports early satiety and tries to eat smaller meals 4 times a day. She suffers from constipation. Dietary recall reveals patient consumes [place] yogurt with berries or cereal and milk at breakfast, and half a sandwich or leftovers at lunch and dinner.  She tries to snack however this is difficult. She does not appear to be taking all recommended bariatric vitamins and minerals.  Nutrition diagnosis: Unintended weight loss related to stage I gastric cancer and distal gastrectomy as evidenced by 30% weight loss over 9 months which is significant.  Intervention: Educated patient to increase calories and protein into 6 small meals and snacks.  Reviewed sources of protein. Reviewed strategies for eating slowly and limiting fluids at meals. Recommended patient try oral nutrition supplements to provide additional calories and protein.  I provided samples and coupons. Provided patient with a vitamin mineral fact sheet on recommendations after gastrectomy. Educated patient on strategies to avoid constipation.  Monitoring, evaluation, goals: Patient will tolerate increased calories and protein to promote weight stabilization and replete deficiencies.  Next visit: Patient will contact me for questions or  concerns.  **Disclaimer: This note was dictated with voice recognition software. Similar sounding words can inadvertently be transcribed and this note may contain transcription errors which may not have been corrected upon publication of note.**

## 2019-07-23 ENCOUNTER — Telehealth: Payer: Self-pay | Admitting: Oncology

## 2019-07-23 NOTE — Telephone Encounter (Signed)
Called and spoke with patient. Confirmed 9/14 appt

## 2019-07-29 DIAGNOSIS — H25813 Combined forms of age-related cataract, bilateral: Secondary | ICD-10-CM | POA: Diagnosis not present

## 2019-07-29 DIAGNOSIS — H52223 Regular astigmatism, bilateral: Secondary | ICD-10-CM | POA: Diagnosis not present

## 2019-07-29 DIAGNOSIS — H5213 Myopia, bilateral: Secondary | ICD-10-CM | POA: Diagnosis not present

## 2019-07-29 DIAGNOSIS — H524 Presbyopia: Secondary | ICD-10-CM | POA: Diagnosis not present

## 2019-08-17 DIAGNOSIS — M79671 Pain in right foot: Secondary | ICD-10-CM | POA: Diagnosis not present

## 2019-08-17 DIAGNOSIS — M13872 Other specified arthritis, left ankle and foot: Secondary | ICD-10-CM | POA: Diagnosis not present

## 2019-08-17 DIAGNOSIS — M79672 Pain in left foot: Secondary | ICD-10-CM | POA: Diagnosis not present

## 2019-08-17 DIAGNOSIS — M19071 Primary osteoarthritis, right ankle and foot: Secondary | ICD-10-CM | POA: Diagnosis not present

## 2019-08-21 ENCOUNTER — Ambulatory Visit
Admission: RE | Admit: 2019-08-21 | Discharge: 2019-08-21 | Disposition: A | Discharge status: Home / Self Care | Payer: Medicare Other | Source: Ambulatory Visit | Attending: Family Medicine | Admitting: Family Medicine

## 2019-08-21 ENCOUNTER — Other Ambulatory Visit: Payer: Self-pay

## 2019-08-21 DIAGNOSIS — Z1231 Encounter for screening mammogram for malignant neoplasm of breast: Secondary | ICD-10-CM

## 2019-08-31 ENCOUNTER — Inpatient Hospital Stay: Payer: Medicare Other | Attending: Oncology | Admitting: Oncology

## 2019-08-31 ENCOUNTER — Other Ambulatory Visit: Payer: Self-pay

## 2019-08-31 ENCOUNTER — Telehealth: Payer: Self-pay | Admitting: Oncology

## 2019-08-31 VITALS — BP 132/60 | HR 57 | Temp 98.0°F | Resp 17 | Ht 60.0 in | Wt 124.1 lb

## 2019-08-31 DIAGNOSIS — C162 Malignant neoplasm of body of stomach: Secondary | ICD-10-CM | POA: Insufficient documentation

## 2019-08-31 DIAGNOSIS — R110 Nausea: Secondary | ICD-10-CM | POA: Insufficient documentation

## 2019-08-31 DIAGNOSIS — R634 Abnormal weight loss: Secondary | ICD-10-CM | POA: Insufficient documentation

## 2019-08-31 DIAGNOSIS — R911 Solitary pulmonary nodule: Secondary | ICD-10-CM | POA: Diagnosis not present

## 2019-08-31 MED ORDER — ONDANSETRON 8 MG PO TBDP: 8.0000 mg | ORAL_TABLET | Freq: Three times a day (TID) | ORAL | 1 refills | Status: DC | PRN

## 2019-08-31 MED ORDER — METOCLOPRAMIDE HCL 10 MG PO TABS: 10.0000 mg | ORAL_TABLET | Freq: Three times a day (TID) | ORAL | 0 refills | Status: DC

## 2019-08-31 NOTE — Progress Notes (Signed)
  [name] OFFICE PROGRESS NOTE   Diagnosis: Gastric cancer  INTERVAL HISTORY:   Jane Howe returns for scheduled visit.  She continues to have nausea.  She reports constant nausea.  No emesis or diarrhea.  She is out of Zofran.  She takes a boost every few days.  Objective:  Vital signs in last 24 hours:  Blood pressure 132/60, pulse (!) 57, temperature 98 F (36.7 C), temperature source Oral, resp. rate 17, height 5' (1.524 m), weight 124 lb 1.6 oz (56.3 kg).   Limited physical examination secondary to distancing with the COVID pandemic Lymphatics: No cervical, supraclavicular, axillary, or inguinal nodes GI: Nontender, no mass, no hepatosplenomegaly Vascular: No leg edema     Lab Results:  Lab Results  Component Value Date   WBC 11.5 (H) 01/08/2019   HGB 11.6 (L) 01/08/2019   HCT 35.8 (L) 01/08/2019   MCV 88.8 01/08/2019   PLT 289 01/08/2019   NEUTROABS 5.6 12/25/2018    CMP  Lab Results  Component Value Date   NA 140 01/08/2019   K 3.6 01/08/2019   CL 103 01/08/2019   CO2 27 01/08/2019   GLUCOSE 113 (H) 01/08/2019   BUN 13 01/08/2019   CREATININE 0.50 01/08/2019   CALCIUM 9.0 01/08/2019   PROT 5.8 (L) 01/08/2019   ALBUMIN 2.9 (L) 01/08/2019   AST 20 01/08/2019   ALT 23 01/08/2019   ALKPHOS 60 01/08/2019   BILITOT 0.4 01/08/2019   GFRNONAA >60 01/08/2019   GFRAA >60 01/08/2019     Medications: I have reviewed the patient's current medications.   Assessment/Plan: 1. 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 Gastritis noted on endoscopy 11/04/2018, biopsy-chronic inactive gastritis, negative for Helicobacter 3. Nausea and "heartburn"- reflux? 4. 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 Family history of multiple cancers 6. Weight loss following distal gastrectomy-nutrition referral made 07/21/2019   Disposition: Jane Howe has persistent nausea and weight loss.  She reports the nausea predated surgery and has persisted.  I have a low clinical suspicion for progression of gastric cancer, but this is possible.  I encouraged her to increase the use of nutrition supplements.  We prescribed Zofran and metoclopramide today.  We reviewed potential toxicities associated with metoclopramide including the chance of tardive dyskinesia.  She agrees to proceed. Jane Howe will return for an office visit in 1 month.  I recommended she follow-up with Drs. Barry Dienes and Schooler for evaluation of persistent nausea.  Jane Coder, MD  08/31/2019  11:53 AM

## 2019-08-31 NOTE — Telephone Encounter (Signed)
Called and spoke with patient. Confirmed appt  °

## 2019-09-07 DIAGNOSIS — F3342 Major depressive disorder, recurrent, in full remission: Secondary | ICD-10-CM | POA: Diagnosis not present

## 2019-09-07 DIAGNOSIS — R7301 Impaired fasting glucose: Secondary | ICD-10-CM | POA: Diagnosis not present

## 2019-09-07 DIAGNOSIS — Z8371 Family history of colonic polyps: Secondary | ICD-10-CM | POA: Diagnosis not present

## 2019-09-07 DIAGNOSIS — I700 Atherosclerosis of aorta: Secondary | ICD-10-CM | POA: Diagnosis not present

## 2019-09-07 DIAGNOSIS — R7309 Other abnormal glucose: Secondary | ICD-10-CM | POA: Diagnosis not present

## 2019-09-07 DIAGNOSIS — G479 Sleep disorder, unspecified: Secondary | ICD-10-CM | POA: Diagnosis not present

## 2019-09-07 DIAGNOSIS — Z0000 Encounter for general adult medical examination without abnormal findings: Secondary | ICD-10-CM | POA: Diagnosis not present

## 2019-09-07 DIAGNOSIS — C169 Malignant neoplasm of stomach, unspecified: Secondary | ICD-10-CM | POA: Diagnosis not present

## 2019-09-07 DIAGNOSIS — R251 Tremor, unspecified: Secondary | ICD-10-CM | POA: Diagnosis not present

## 2019-09-07 DIAGNOSIS — R110 Nausea: Secondary | ICD-10-CM | POA: Diagnosis not present

## 2019-09-07 DIAGNOSIS — E782 Mixed hyperlipidemia: Secondary | ICD-10-CM | POA: Diagnosis not present

## 2019-09-07 DIAGNOSIS — K5900 Constipation, unspecified: Secondary | ICD-10-CM | POA: Diagnosis not present

## 2019-09-09 ENCOUNTER — Other Ambulatory Visit: Payer: Self-pay | Admitting: General Surgery

## 2019-09-09 DIAGNOSIS — R112 Nausea with vomiting, unspecified: Secondary | ICD-10-CM

## 2019-09-10 DIAGNOSIS — R110 Nausea: Secondary | ICD-10-CM | POA: Diagnosis not present

## 2019-09-10 DIAGNOSIS — G479 Sleep disorder, unspecified: Secondary | ICD-10-CM | POA: Diagnosis not present

## 2019-09-10 DIAGNOSIS — R251 Tremor, unspecified: Secondary | ICD-10-CM | POA: Diagnosis not present

## 2019-09-10 DIAGNOSIS — E782 Mixed hyperlipidemia: Secondary | ICD-10-CM | POA: Diagnosis not present

## 2019-09-10 DIAGNOSIS — K912 Postsurgical malabsorption, not elsewhere classified: Secondary | ICD-10-CM | POA: Diagnosis not present

## 2019-09-10 DIAGNOSIS — F3342 Major depressive disorder, recurrent, in full remission: Secondary | ICD-10-CM | POA: Diagnosis not present

## 2019-09-10 DIAGNOSIS — K5900 Constipation, unspecified: Secondary | ICD-10-CM | POA: Diagnosis not present

## 2019-09-10 DIAGNOSIS — C169 Malignant neoplasm of stomach, unspecified: Secondary | ICD-10-CM | POA: Diagnosis not present

## 2019-09-10 DIAGNOSIS — K219 Gastro-esophageal reflux disease without esophagitis: Secondary | ICD-10-CM | POA: Diagnosis not present

## 2019-09-10 DIAGNOSIS — R7301 Impaired fasting glucose: Secondary | ICD-10-CM | POA: Diagnosis not present

## 2019-09-10 DIAGNOSIS — K645 Perianal venous thrombosis: Secondary | ICD-10-CM | POA: Diagnosis not present

## 2019-09-10 DIAGNOSIS — M351 Other overlap syndromes: Secondary | ICD-10-CM | POA: Diagnosis not present

## 2019-09-11 DIAGNOSIS — K645 Perianal venous thrombosis: Secondary | ICD-10-CM | POA: Diagnosis not present

## 2019-09-18 ENCOUNTER — Ambulatory Visit
Admission: RE | Admit: 2019-09-18 | Discharge: 2019-09-18 | Disposition: A | Discharge status: Home / Self Care | Payer: Medicare Other | Source: Ambulatory Visit | Attending: General Surgery | Admitting: General Surgery

## 2019-09-18 ENCOUNTER — Other Ambulatory Visit: Payer: Self-pay

## 2019-09-18 DIAGNOSIS — R112 Nausea with vomiting, unspecified: Secondary | ICD-10-CM

## 2019-09-18 DIAGNOSIS — K838 Other specified diseases of biliary tract: Secondary | ICD-10-CM | POA: Diagnosis not present

## 2019-09-18 DIAGNOSIS — R195 Other fecal abnormalities: Secondary | ICD-10-CM | POA: Diagnosis not present

## 2019-09-18 DIAGNOSIS — R918 Other nonspecific abnormal finding of lung field: Secondary | ICD-10-CM | POA: Diagnosis not present

## 2019-09-18 MED ORDER — IOPAMIDOL (ISOVUE-300) INJECTION 61%
100.0000 mL | Freq: Once | INTRAVENOUS | Status: AC | PRN
  Administered 2019-09-18: 12:00:00 100 mL via INTRAVENOUS

## 2019-09-19 NOTE — Progress Notes (Signed)
Please let patient know scans are negative for any evidence of cancer.  They do show extreme constipation.  Please ask if she is on a bowel regimen.  Thanks

## 2019-09-24 DIAGNOSIS — Z23 Encounter for immunization: Secondary | ICD-10-CM | POA: Diagnosis not present

## 2019-10-02 ENCOUNTER — Inpatient Hospital Stay: Payer: Medicare Other | Attending: Oncology | Admitting: Oncology

## 2019-10-02 ENCOUNTER — Other Ambulatory Visit: Payer: Self-pay

## 2019-10-02 VITALS — BP 138/73 | HR 59 | Temp 98.5°F | Resp 18 | Ht 60.0 in | Wt 126.5 lb

## 2019-10-02 DIAGNOSIS — L989 Disorder of the skin and subcutaneous tissue, unspecified: Secondary | ICD-10-CM | POA: Diagnosis not present

## 2019-10-02 DIAGNOSIS — C162 Malignant neoplasm of body of stomach: Secondary | ICD-10-CM | POA: Insufficient documentation

## 2019-10-02 NOTE — Progress Notes (Signed)
  [facility] OFFICE PROGRESS NOTE   Diagnosis: Gastric cancer  INTERVAL HISTORY:   [name] returns as scheduled.  She reports improvement in nausea since beginning Reglan.  Good appetite.  No new complaint.  She has noticed a skin lesion at the left anterior chest wall.  Objective:  Vital signs in last 24 hours:  Blood pressure 138/73, pulse (!) 59, temperature 98.5 F (36.9 C), temperature source Temporal, resp. rate 18, height 5' (1.524 m), weight 126 lb 8 oz (57.4 kg), SpO2 98 %.    Lymphatics: No cervical, supraclavicular, axillary, or inguinal nodes GI: No hepatosplenomegaly, no mass, nontender Vascular: No leg edema  Skin: Slightly raised pink/tan 1 cm lesion of the left upper anterior chest   Medications: I have reviewed the patient's current medications.   Assessment/Plan: 1. 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 Gastritis noted on endoscopy 11/04/2018, biopsy-chronic inactive gastritis, negative for Helicobacter 3. History of nausea and "heartburn"- reflux? 4. 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 Family history of multiple cancers 6. Weight loss following distal gastrectomy-nutrition referral made 07/21/2019, improved after beginning Reglan      Disposition: Jane Howe has experienced significant improvement in nausea since beginning Reglan.  She will continue Reglan.  Restaging CTs earlier this month revealed no evidence of recurrent gastric cancer.  She is scheduled to see Dr. Barry Dienes next month.  She will return for an office visit in 6 months.  The lesion at the left anterior chest wall is likely a benign mole.  She is scheduled to see dermatology next month.  Betsy Coder, MD  10/02/2019  11:11 AM

## 2019-10-05 ENCOUNTER — Other Ambulatory Visit: Payer: Self-pay | Admitting: Oncology

## 2019-10-05 ENCOUNTER — Telehealth: Payer: Self-pay | Admitting: Oncology

## 2019-10-05 MED ORDER — METOCLOPRAMIDE HCL 10 MG PO TABS: 10.0000 mg | ORAL_TABLET | Freq: Three times a day (TID) | ORAL | 2 refills | Status: DC

## 2019-10-05 NOTE — Telephone Encounter (Signed)
Called and left msg.mailed printout  °

## 2019-10-23 DIAGNOSIS — Z85028 Personal history of other malignant neoplasm of stomach: Secondary | ICD-10-CM | POA: Diagnosis not present

## 2019-10-30 DIAGNOSIS — D1801 Hemangioma of skin and subcutaneous tissue: Secondary | ICD-10-CM | POA: Diagnosis not present

## 2019-10-30 DIAGNOSIS — L821 Other seborrheic keratosis: Secondary | ICD-10-CM | POA: Diagnosis not present

## 2019-10-30 DIAGNOSIS — L814 Other melanin hyperpigmentation: Secondary | ICD-10-CM | POA: Diagnosis not present

## 2019-10-30 DIAGNOSIS — Z85828 Personal history of other malignant neoplasm of skin: Secondary | ICD-10-CM | POA: Diagnosis not present

## 2019-10-30 DIAGNOSIS — D2272 Melanocytic nevi of left lower limb, including hip: Secondary | ICD-10-CM | POA: Diagnosis not present

## 2019-10-30 DIAGNOSIS — L918 Other hypertrophic disorders of the skin: Secondary | ICD-10-CM | POA: Diagnosis not present

## 2019-10-30 DIAGNOSIS — D225 Melanocytic nevi of trunk: Secondary | ICD-10-CM | POA: Diagnosis not present

## 2019-10-30 DIAGNOSIS — L650 Telogen effluvium: Secondary | ICD-10-CM | POA: Diagnosis not present

## 2019-10-30 DIAGNOSIS — L570 Actinic keratosis: Secondary | ICD-10-CM | POA: Diagnosis not present

## 2019-11-16 DIAGNOSIS — M25572 Pain in left ankle and joints of left foot: Secondary | ICD-10-CM | POA: Diagnosis not present

## 2019-11-16 DIAGNOSIS — M25571 Pain in right ankle and joints of right foot: Secondary | ICD-10-CM | POA: Diagnosis not present

## 2019-11-27 DIAGNOSIS — L659 Nonscarring hair loss, unspecified: Secondary | ICD-10-CM | POA: Diagnosis not present

## 2020-02-25 DIAGNOSIS — F3342 Major depressive disorder, recurrent, in full remission: Secondary | ICD-10-CM | POA: Diagnosis not present

## 2020-02-25 DIAGNOSIS — E782 Mixed hyperlipidemia: Secondary | ICD-10-CM | POA: Diagnosis not present

## 2020-02-25 DIAGNOSIS — M1990 Unspecified osteoarthritis, unspecified site: Secondary | ICD-10-CM | POA: Diagnosis not present

## 2020-03-07 DIAGNOSIS — R251 Tremor, unspecified: Secondary | ICD-10-CM | POA: Diagnosis not present

## 2020-03-07 DIAGNOSIS — E559 Vitamin D deficiency, unspecified: Secondary | ICD-10-CM | POA: Diagnosis not present

## 2020-03-07 DIAGNOSIS — E782 Mixed hyperlipidemia: Secondary | ICD-10-CM | POA: Diagnosis not present

## 2020-03-07 DIAGNOSIS — F3342 Major depressive disorder, recurrent, in full remission: Secondary | ICD-10-CM | POA: Diagnosis not present

## 2020-03-07 DIAGNOSIS — G479 Sleep disorder, unspecified: Secondary | ICD-10-CM | POA: Diagnosis not present

## 2020-03-07 DIAGNOSIS — M351 Other overlap syndromes: Secondary | ICD-10-CM | POA: Diagnosis not present

## 2020-03-07 DIAGNOSIS — M8589 Other specified disorders of bone density and structure, multiple sites: Secondary | ICD-10-CM | POA: Diagnosis not present

## 2020-03-07 DIAGNOSIS — K219 Gastro-esophageal reflux disease without esophagitis: Secondary | ICD-10-CM | POA: Diagnosis not present

## 2020-03-07 DIAGNOSIS — R110 Nausea: Secondary | ICD-10-CM | POA: Diagnosis not present

## 2020-03-07 DIAGNOSIS — K5900 Constipation, unspecified: Secondary | ICD-10-CM | POA: Diagnosis not present

## 2020-03-08 DIAGNOSIS — K219 Gastro-esophageal reflux disease without esophagitis: Secondary | ICD-10-CM | POA: Diagnosis not present

## 2020-03-08 DIAGNOSIS — M8589 Other specified disorders of bone density and structure, multiple sites: Secondary | ICD-10-CM | POA: Diagnosis not present

## 2020-03-08 DIAGNOSIS — E038 Other specified hypothyroidism: Secondary | ICD-10-CM | POA: Diagnosis not present

## 2020-03-08 DIAGNOSIS — L659 Nonscarring hair loss, unspecified: Secondary | ICD-10-CM | POA: Diagnosis not present

## 2020-03-08 DIAGNOSIS — K645 Perianal venous thrombosis: Secondary | ICD-10-CM | POA: Diagnosis not present

## 2020-03-08 DIAGNOSIS — R251 Tremor, unspecified: Secondary | ICD-10-CM | POA: Diagnosis not present

## 2020-03-08 DIAGNOSIS — K5900 Constipation, unspecified: Secondary | ICD-10-CM | POA: Diagnosis not present

## 2020-03-08 DIAGNOSIS — F3342 Major depressive disorder, recurrent, in full remission: Secondary | ICD-10-CM | POA: Diagnosis not present

## 2020-03-08 DIAGNOSIS — E673 Hypervitaminosis D: Secondary | ICD-10-CM | POA: Diagnosis not present

## 2020-03-08 DIAGNOSIS — K912 Postsurgical malabsorption, not elsewhere classified: Secondary | ICD-10-CM | POA: Diagnosis not present

## 2020-03-08 DIAGNOSIS — E782 Mixed hyperlipidemia: Secondary | ICD-10-CM | POA: Diagnosis not present

## 2020-03-08 DIAGNOSIS — R110 Nausea: Secondary | ICD-10-CM | POA: Diagnosis not present

## 2020-04-01 ENCOUNTER — Other Ambulatory Visit: Payer: Self-pay

## 2020-04-01 ENCOUNTER — Inpatient Hospital Stay: Payer: Medicare Other | Attending: Oncology | Admitting: Oncology

## 2020-04-01 ENCOUNTER — Other Ambulatory Visit: Payer: Self-pay | Admitting: *Deleted

## 2020-04-01 VITALS — BP 130/65 | HR 52 | Temp 98.5°F | Resp 18 | Ht 60.0 in | Wt 117.6 lb

## 2020-04-01 DIAGNOSIS — R918 Other nonspecific abnormal finding of lung field: Secondary | ICD-10-CM | POA: Insufficient documentation

## 2020-04-01 DIAGNOSIS — R110 Nausea: Secondary | ICD-10-CM | POA: Insufficient documentation

## 2020-04-01 DIAGNOSIS — C162 Malignant neoplasm of body of stomach: Secondary | ICD-10-CM | POA: Diagnosis not present

## 2020-04-01 DIAGNOSIS — K649 Unspecified hemorrhoids: Secondary | ICD-10-CM | POA: Diagnosis not present

## 2020-04-01 DIAGNOSIS — R251 Tremor, unspecified: Secondary | ICD-10-CM | POA: Insufficient documentation

## 2020-04-01 MED ORDER — ONDANSETRON 8 MG PO TBDP: 8.0000 mg | ORAL_TABLET | Freq: Three times a day (TID) | ORAL | 1 refills | Status: DC | PRN

## 2020-04-01 NOTE — Progress Notes (Signed)
[facility] OFFICE PROGRESS NOTE   Diagnosis: Gastric cancer  INTERVAL HISTORY:   Jane Howe returns as scheduled.  She feels well.  She has arthritis pain in the feet.  No dysphagia.  She has a good appetite.  She says her weight has stabilized over the past 3 months.  She uses boost intermittently, but does not like the taste.  She has a chronic tremor of the head and hands.  This worsened with Reglan.  She discontinued Reglan 3 months ago.  The tremor partially improved.  Reglan helped nausea.  She has constant nausea, no emesis.  She takes Zofran in the mornings.  She has a history of "hemorrhoids "and would like surgical evaluation of a new hemorrhoid.  Objective:  Vital signs in last 24 hours:  Blood pressure 130/65, pulse (!) 52, temperature 98.5 F (36.9 C), temperature source Oral, resp. rate 18, height 5' (1.524 m), weight 117 lb 9.6 oz (53.3 kg), SpO2 100 %.    HEENT: Neck without mass Lymphatics: No cervical, supraclavicular, axillary, or inguinal nodes GI: No hepatosplenomegaly, no mass, nontender Vascular: No leg edema Neuro: Mild resting tremor of the left hand.  No appreciable head tremor      Lab Results:  Lab Results  Component Value Date   WBC 11.5 (H) 01/08/2019   HGB 11.6 (L) 01/08/2019   HCT 35.8 (L) 01/08/2019   MCV 88.8 01/08/2019   PLT 289 01/08/2019   NEUTROABS 5.6 12/25/2018    CMP  Lab Results  Component Value Date   NA 140 01/08/2019   K 3.6 01/08/2019   CL 103 01/08/2019   CO2 27 01/08/2019   GLUCOSE 113 (H) 01/08/2019   BUN 13 01/08/2019   CREATININE 0.50 01/08/2019   CALCIUM 9.0 01/08/2019   PROT 5.8 (L) 01/08/2019   ALBUMIN 2.9 (L) 01/08/2019   AST 20 01/08/2019   ALT 23 01/08/2019   ALKPHOS 60 01/08/2019   BILITOT 0.4 01/08/2019   GFRNONAA >60 01/08/2019   GFRAA >60 01/08/2019    Medications: I have reviewed the patient's current medications.   Assessment/Plan: 1. 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 Gastritis noted on endoscopy 11/04/2018, biopsy-chronic inactive gastritis, negative for Helicobacter 3. History of nausea and "heartburn"- reflux? 4. 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 Family history of multiple cancers 6. Weight loss following distal gastrectomy-nutrition referral made 07/21/2019, improved after beginning Reglan     Disposition: Jane Howe is in remission from gastric cancer.  She has lost weight compared to when she was here in October, but reports her weight has stabilized over the past several months.  I encouraged her to increase the use of nutrition supplements including protein bars and liquid supplements.  She will continue Zofran as needed for nausea.  Jane Howe will return for an office visit in 6 months.  I made a referral to Dr. Marcello Moores for  evaluation of "hemorrhoids ".  Jane Coder, MD  04/01/2020  12:07 PM

## 2020-04-04 DIAGNOSIS — F3342 Major depressive disorder, recurrent, in full remission: Secondary | ICD-10-CM | POA: Diagnosis not present

## 2020-04-04 DIAGNOSIS — M1990 Unspecified osteoarthritis, unspecified site: Secondary | ICD-10-CM | POA: Diagnosis not present

## 2020-04-04 DIAGNOSIS — E782 Mixed hyperlipidemia: Secondary | ICD-10-CM | POA: Diagnosis not present

## 2020-04-05 ENCOUNTER — Telehealth: Payer: Self-pay | Admitting: Oncology

## 2020-04-05 NOTE — Telephone Encounter (Signed)
Scheduled per los. Called and left msg. Mailed printout  °

## 2020-05-13 DIAGNOSIS — F3342 Major depressive disorder, recurrent, in full remission: Secondary | ICD-10-CM | POA: Diagnosis not present

## 2020-05-13 DIAGNOSIS — E782 Mixed hyperlipidemia: Secondary | ICD-10-CM | POA: Diagnosis not present

## 2020-05-13 DIAGNOSIS — M1990 Unspecified osteoarthritis, unspecified site: Secondary | ICD-10-CM | POA: Diagnosis not present

## 2020-07-01 ENCOUNTER — Telehealth: Payer: Self-pay

## 2020-07-01 NOTE — Telephone Encounter (Signed)
Received call from patient concerns about a pea sized lump under her left ear on her neck that she noticed 3 weeks ago. It has not changed during this time and no symptoms related. She also stated that she was supposed to receive a referral for her hemorrhoids and has not heard anything. Contacted the office of Dr. Marcello Moores regarding referral made on 4/16. Received a new patient appt. Called patient back and made aware of new patient appt and that Dr. Benay Spice stated that the patient can have the area looked at during the above appt or by PCP. Patient verbalized understanding of the above information and had no other questions or concerns.

## 2020-07-15 DIAGNOSIS — M1990 Unspecified osteoarthritis, unspecified site: Secondary | ICD-10-CM | POA: Diagnosis not present

## 2020-07-15 DIAGNOSIS — E782 Mixed hyperlipidemia: Secondary | ICD-10-CM | POA: Diagnosis not present

## 2020-07-15 DIAGNOSIS — F3342 Major depressive disorder, recurrent, in full remission: Secondary | ICD-10-CM | POA: Diagnosis not present

## 2020-07-28 DIAGNOSIS — H524 Presbyopia: Secondary | ICD-10-CM | POA: Diagnosis not present

## 2020-07-28 DIAGNOSIS — H04123 Dry eye syndrome of bilateral lacrimal glands: Secondary | ICD-10-CM | POA: Diagnosis not present

## 2020-07-28 DIAGNOSIS — H43813 Vitreous degeneration, bilateral: Secondary | ICD-10-CM | POA: Diagnosis not present

## 2020-07-28 DIAGNOSIS — H25813 Combined forms of age-related cataract, bilateral: Secondary | ICD-10-CM | POA: Diagnosis not present

## 2020-07-28 DIAGNOSIS — H52221 Regular astigmatism, right eye: Secondary | ICD-10-CM | POA: Diagnosis not present

## 2020-07-28 DIAGNOSIS — H43393 Other vitreous opacities, bilateral: Secondary | ICD-10-CM | POA: Diagnosis not present

## 2020-07-28 DIAGNOSIS — H2513 Age-related nuclear cataract, bilateral: Secondary | ICD-10-CM | POA: Diagnosis not present

## 2020-07-28 DIAGNOSIS — H5213 Myopia, bilateral: Secondary | ICD-10-CM | POA: Diagnosis not present

## 2020-08-10 DIAGNOSIS — K641 Second degree hemorrhoids: Secondary | ICD-10-CM | POA: Diagnosis not present

## 2020-08-10 DIAGNOSIS — R221 Localized swelling, mass and lump, neck: Secondary | ICD-10-CM | POA: Diagnosis not present

## 2020-08-12 DIAGNOSIS — E782 Mixed hyperlipidemia: Secondary | ICD-10-CM | POA: Diagnosis not present

## 2020-08-12 DIAGNOSIS — K219 Gastro-esophageal reflux disease without esophagitis: Secondary | ICD-10-CM | POA: Diagnosis not present

## 2020-08-12 DIAGNOSIS — F3342 Major depressive disorder, recurrent, in full remission: Secondary | ICD-10-CM | POA: Diagnosis not present

## 2020-08-12 DIAGNOSIS — M1990 Unspecified osteoarthritis, unspecified site: Secondary | ICD-10-CM | POA: Diagnosis not present

## 2020-09-01 DIAGNOSIS — K912 Postsurgical malabsorption, not elsewhere classified: Secondary | ICD-10-CM | POA: Diagnosis not present

## 2020-09-01 DIAGNOSIS — K219 Gastro-esophageal reflux disease without esophagitis: Secondary | ICD-10-CM | POA: Diagnosis not present

## 2020-09-01 DIAGNOSIS — E782 Mixed hyperlipidemia: Secondary | ICD-10-CM | POA: Diagnosis not present

## 2020-09-01 DIAGNOSIS — E673 Hypervitaminosis D: Secondary | ICD-10-CM | POA: Diagnosis not present

## 2020-09-01 DIAGNOSIS — R7309 Other abnormal glucose: Secondary | ICD-10-CM | POA: Diagnosis not present

## 2020-09-01 DIAGNOSIS — L659 Nonscarring hair loss, unspecified: Secondary | ICD-10-CM | POA: Diagnosis not present

## 2020-09-01 DIAGNOSIS — F3342 Major depressive disorder, recurrent, in full remission: Secondary | ICD-10-CM | POA: Diagnosis not present

## 2020-09-01 DIAGNOSIS — C169 Malignant neoplasm of stomach, unspecified: Secondary | ICD-10-CM | POA: Diagnosis not present

## 2020-09-01 DIAGNOSIS — M351 Other overlap syndromes: Secondary | ICD-10-CM | POA: Diagnosis not present

## 2020-09-01 DIAGNOSIS — M8589 Other specified disorders of bone density and structure, multiple sites: Secondary | ICD-10-CM | POA: Diagnosis not present

## 2020-09-01 DIAGNOSIS — K645 Perianal venous thrombosis: Secondary | ICD-10-CM | POA: Diagnosis not present

## 2020-09-09 DIAGNOSIS — Z01419 Encounter for gynecological examination (general) (routine) without abnormal findings: Secondary | ICD-10-CM | POA: Diagnosis not present

## 2020-09-09 DIAGNOSIS — E782 Mixed hyperlipidemia: Secondary | ICD-10-CM | POA: Diagnosis not present

## 2020-09-09 DIAGNOSIS — C169 Malignant neoplasm of stomach, unspecified: Secondary | ICD-10-CM | POA: Diagnosis not present

## 2020-09-09 DIAGNOSIS — Z0000 Encounter for general adult medical examination without abnormal findings: Secondary | ICD-10-CM | POA: Diagnosis not present

## 2020-09-09 DIAGNOSIS — I700 Atherosclerosis of aorta: Secondary | ICD-10-CM | POA: Diagnosis not present

## 2020-09-09 DIAGNOSIS — M351 Other overlap syndromes: Secondary | ICD-10-CM | POA: Diagnosis not present

## 2020-09-09 DIAGNOSIS — F3342 Major depressive disorder, recurrent, in full remission: Secondary | ICD-10-CM | POA: Diagnosis not present

## 2020-09-09 DIAGNOSIS — Z23 Encounter for immunization: Secondary | ICD-10-CM | POA: Diagnosis not present

## 2020-09-09 DIAGNOSIS — E673 Hypervitaminosis D: Secondary | ICD-10-CM | POA: Diagnosis not present

## 2020-09-09 DIAGNOSIS — K219 Gastro-esophageal reflux disease without esophagitis: Secondary | ICD-10-CM | POA: Diagnosis not present

## 2020-09-09 DIAGNOSIS — L659 Nonscarring hair loss, unspecified: Secondary | ICD-10-CM | POA: Diagnosis not present

## 2020-09-09 DIAGNOSIS — R634 Abnormal weight loss: Secondary | ICD-10-CM | POA: Diagnosis not present

## 2020-09-13 ENCOUNTER — Other Ambulatory Visit: Payer: Self-pay | Admitting: Family Medicine

## 2020-09-13 DIAGNOSIS — Z1231 Encounter for screening mammogram for malignant neoplasm of breast: Secondary | ICD-10-CM

## 2020-09-13 DIAGNOSIS — M8589 Other specified disorders of bone density and structure, multiple sites: Secondary | ICD-10-CM

## 2020-09-14 DIAGNOSIS — M1990 Unspecified osteoarthritis, unspecified site: Secondary | ICD-10-CM | POA: Diagnosis not present

## 2020-09-14 DIAGNOSIS — F3342 Major depressive disorder, recurrent, in full remission: Secondary | ICD-10-CM | POA: Diagnosis not present

## 2020-09-14 DIAGNOSIS — E782 Mixed hyperlipidemia: Secondary | ICD-10-CM | POA: Diagnosis not present

## 2020-09-19 ENCOUNTER — Other Ambulatory Visit: Payer: Self-pay | Admitting: General Surgery

## 2020-09-19 DIAGNOSIS — R221 Localized swelling, mass and lump, neck: Secondary | ICD-10-CM

## 2020-09-19 DIAGNOSIS — Z85028 Personal history of other malignant neoplasm of stomach: Secondary | ICD-10-CM | POA: Diagnosis not present

## 2020-09-27 ENCOUNTER — Telehealth: Payer: Self-pay | Admitting: Oncology

## 2020-09-27 NOTE — Telephone Encounter (Signed)
Rescheduled appointment per provider's On-Call schedule. Spoke to patient who is aware of updated appointment date and time.

## 2020-09-30 ENCOUNTER — Other Ambulatory Visit: Payer: Self-pay

## 2020-09-30 ENCOUNTER — Ambulatory Visit
Admission: RE | Admit: 2020-09-30 | Discharge: 2020-09-30 | Disposition: A | Discharge status: Home / Self Care | Payer: Medicare Other | Source: Ambulatory Visit | Attending: Family Medicine | Admitting: Family Medicine

## 2020-09-30 DIAGNOSIS — Z1231 Encounter for screening mammogram for malignant neoplasm of breast: Secondary | ICD-10-CM | POA: Diagnosis not present

## 2020-10-03 ENCOUNTER — Ambulatory Visit: Payer: Medicare Other | Admitting: Oncology

## 2020-10-03 ENCOUNTER — Ambulatory Visit
Admission: RE | Admit: 2020-10-03 | Discharge: 2020-10-03 | Disposition: A | Discharge status: Home / Self Care | Payer: Medicare Other | Source: Ambulatory Visit | Attending: General Surgery | Admitting: General Surgery

## 2020-10-03 DIAGNOSIS — I6529 Occlusion and stenosis of unspecified carotid artery: Secondary | ICD-10-CM | POA: Diagnosis not present

## 2020-10-03 DIAGNOSIS — M4312 Spondylolisthesis, cervical region: Secondary | ICD-10-CM | POA: Diagnosis not present

## 2020-10-03 DIAGNOSIS — M47812 Spondylosis without myelopathy or radiculopathy, cervical region: Secondary | ICD-10-CM | POA: Diagnosis not present

## 2020-10-03 DIAGNOSIS — R221 Localized swelling, mass and lump, neck: Secondary | ICD-10-CM

## 2020-10-03 DIAGNOSIS — I771 Stricture of artery: Secondary | ICD-10-CM | POA: Diagnosis not present

## 2020-10-03 MED ORDER — IOPAMIDOL (ISOVUE-300) INJECTION 61%
75.0000 mL | Freq: Once | INTRAVENOUS | Status: AC | PRN
  Administered 2020-10-03: 75 mL via INTRAVENOUS

## 2020-10-06 ENCOUNTER — Other Ambulatory Visit: Payer: Self-pay | Admitting: Family Medicine

## 2020-10-06 DIAGNOSIS — R928 Other abnormal and inconclusive findings on diagnostic imaging of breast: Secondary | ICD-10-CM

## 2020-10-12 DIAGNOSIS — K641 Second degree hemorrhoids: Secondary | ICD-10-CM | POA: Diagnosis not present

## 2020-10-21 ENCOUNTER — Telehealth: Payer: Self-pay | Admitting: Oncology

## 2020-10-21 ENCOUNTER — Inpatient Hospital Stay: Payer: Medicare Other | Attending: Oncology | Admitting: Oncology

## 2020-10-21 ENCOUNTER — Ambulatory Visit: Payer: Medicare Other

## 2020-10-21 ENCOUNTER — Ambulatory Visit
Admission: RE | Admit: 2020-10-21 | Discharge: 2020-10-21 | Disposition: A | Discharge status: Home / Self Care | Payer: Medicare Other | Source: Ambulatory Visit | Attending: Family Medicine | Admitting: Family Medicine

## 2020-10-21 ENCOUNTER — Other Ambulatory Visit: Payer: Self-pay | Admitting: *Deleted

## 2020-10-21 ENCOUNTER — Other Ambulatory Visit: Payer: Self-pay

## 2020-10-21 VITALS — BP 129/58 | HR 53 | Temp 98.6°F | Resp 18 | Ht 60.0 in | Wt 116.1 lb

## 2020-10-21 DIAGNOSIS — C160 Malignant neoplasm of cardia: Secondary | ICD-10-CM | POA: Diagnosis not present

## 2020-10-21 DIAGNOSIS — R928 Other abnormal and inconclusive findings on diagnostic imaging of breast: Secondary | ICD-10-CM

## 2020-10-21 DIAGNOSIS — C162 Malignant neoplasm of body of stomach: Secondary | ICD-10-CM

## 2020-10-21 MED ORDER — ONDANSETRON 8 MG PO TBDP: 8.0000 mg | ORAL_TABLET | Freq: Three times a day (TID) | ORAL | 1 refills | Status: DC | PRN

## 2020-10-21 NOTE — Telephone Encounter (Signed)
Scheduled appointment per 11/5 los. Spoke to patient who is aware of appointment date and time.  

## 2020-10-21 NOTE — Progress Notes (Signed)
[facility] OFFICE PROGRESS NOTE   Diagnosis: Gastric cancer  INTERVAL HISTORY:   Jane Howe returns as scheduled. She feels well. She has intermittent nausea, relieved with Zofran. She has a good appetite. She reports her weight has stabilized. She saw Dr. Barry Dienes on 09/19/2020. Dr. Barry Dienes palpated a fullness in the left upper cervical/submandibular region. Jane Howe reports this has been present for the past several months. A CT of the neck on 10/03/2020 revealed no mass or pathologic enlarged lymph node. The left cervical ICA was noted to be torturous beneath the inferior left parotid gland. This was felt to potentially account for the palpable finding.  Objective:  Vital signs in last 24 hours:  Blood pressure (!) 129/58, pulse (!) 53, temperature 98.6 F (37 C), temperature source Tympanic, resp. rate 18, height 5' (1.524 m), weight 116 lb 1.6 oz (52.7 kg), SpO2 100 %.    HEENT: Oral cavity without visible mass, soft fullness overlying the carotid pulse at the high cervical region near the angle of the jaw, I cannot palpate a discrete mass, though there may be a pea-sized node Lymphatics: "Shotty "low left posterior cervical/scalene node, pea-sized bilateral axillary nodes. No inguinal nodes. Resp: Coarse end inspiratory rhonchi at the upper posterior chest bilaterally, no respiratory distress Cardio: Regular rate and rhythm GI: No hepatosplenomegaly, no mass, nontender Vascular: No leg edema   Lab Results:  Lab Results  Component Value Date   WBC 11.5 (H) 01/08/2019   HGB 11.6 (L) 01/08/2019   HCT 35.8 (L) 01/08/2019   MCV 88.8 01/08/2019   PLT 289 01/08/2019   NEUTROABS 5.6 12/25/2018    CMP  Lab Results  Component Value Date   NA 140 01/08/2019   K 3.6 01/08/2019   CL 103 01/08/2019   CO2 27 01/08/2019   GLUCOSE 113 (H) 01/08/2019   BUN 13 01/08/2019   CREATININE 0.50 01/08/2019   CALCIUM 9.0 01/08/2019   PROT 5.8 (L) 01/08/2019   ALBUMIN 2.9  (L) 01/08/2019   AST 20 01/08/2019   ALT 23 01/08/2019   ALKPHOS 60 01/08/2019   BILITOT 0.4 01/08/2019   GFRNONAA >60 01/08/2019   GFRAA >60 01/08/2019    No results found for: CEA1  Lab Results  Component Value Date   INR 0.96 12/25/2018    Imaging:  MM DIAG BREAST TOMO UNI RIGHT  Result Date: 10/21/2020 CLINICAL DATA:  74 year old female recalled from screening mammogram dated 09/30/2020 for possible right breast distortion. EXAM: DIGITAL DIAGNOSTIC UNILATERAL RIGHT MAMMOGRAM WITH TOMO AND CAD COMPARISON:  Previous exam(s). ACR Breast Density Category c: The breast tissue is heterogeneously dense, which may obscure small masses. FINDINGS: Previously described, possible distortion in the upper, slightly inner right breast at middle depth resolves into well dispersed fibroglandular tissue on today's additional views. No persistent, suspicious findings are identified. Mammographic images were processed with CAD. IMPRESSION: No mammographic evidence of malignancy. RECOMMENDATION: Screening mammogram in one year.(Code:[id number]) I have discussed the findings and recommendations with the patient. If applicable, a reminder letter will be sent to the patient regarding the next appointment. BI-RADS CATEGORY  1: Negative. Electronically Signed   By: Kristopher Oppenheim M.D.   On: 10/21/2020 09:22    Medications: I have reviewed the patient's current medications.   Assessment/Plan: 1. 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 Gastritis noted on endoscopy 11/04/2018, biopsy-chronic inactive gastritis, negative for Helicobacter 3. History of nausea and "heartburn"- reflux? 4. 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 Family history of multiple cancers 6. Weight loss following distal gastrectomy-nutrition referral made 07/21/2019, improved after beginning Reglan-Reglan discontinued secondary to tremors     Disposition: Jane Howe remains in clinical remission from gastric cancer. Her weight has stabilized. The etiology of the palpable fullness at the left high cervical region is unclear. This may be related the left carotid. She will call if this area enlarges.  Jane Howe will return for an office visit in 6 months.  Betsy Coder, MD  10/21/2020  12:08 PM

## 2020-10-28 DIAGNOSIS — K625 Hemorrhage of anus and rectum: Secondary | ICD-10-CM | POA: Diagnosis not present

## 2020-10-28 DIAGNOSIS — K648 Other hemorrhoids: Secondary | ICD-10-CM | POA: Diagnosis not present

## 2020-10-28 DIAGNOSIS — K5900 Constipation, unspecified: Secondary | ICD-10-CM | POA: Diagnosis not present

## 2020-10-28 DIAGNOSIS — C169 Malignant neoplasm of stomach, unspecified: Secondary | ICD-10-CM | POA: Diagnosis not present

## 2020-10-29 ENCOUNTER — Other Ambulatory Visit: Payer: Self-pay

## 2020-10-29 ENCOUNTER — Ambulatory Visit: Payer: Medicare Other | Attending: Internal Medicine

## 2020-10-29 DIAGNOSIS — Z23 Encounter for immunization: Secondary | ICD-10-CM

## 2020-10-29 NOTE — Progress Notes (Signed)
   Covid-19 Vaccination Clinic  Name:  Jane Howe    MRN: [id number] DOB: 03/28/1946  10/29/2020  [name] was observed post Covid-19 immunization for 15 minutes without incident. She was provided with Vaccine Information Sheet and instruction to access the V-Safe system.   [name] was instructed to call 911 with any severe reactions post vaccine: Marland Kitchen Difficulty breathing  . Swelling of face and throat  . A fast heartbeat  . A bad rash all over body  . Dizziness and weakness   Immunizations Administered    Name Date Dose VIS Date Route   Pfizer COVID-19 Vaccine 10/29/2020  3:56 PM 0.3 mL 10/05/2020 Intramuscular   Manufacturer: [place]   Lot: [id number]   [place]: [id number]

## 2020-11-02 DIAGNOSIS — L821 Other seborrheic keratosis: Secondary | ICD-10-CM | POA: Diagnosis not present

## 2020-11-02 DIAGNOSIS — D1801 Hemangioma of skin and subcutaneous tissue: Secondary | ICD-10-CM | POA: Diagnosis not present

## 2020-11-02 DIAGNOSIS — D2271 Melanocytic nevi of right lower limb, including hip: Secondary | ICD-10-CM | POA: Diagnosis not present

## 2020-11-02 DIAGNOSIS — D225 Melanocytic nevi of trunk: Secondary | ICD-10-CM | POA: Diagnosis not present

## 2020-11-02 DIAGNOSIS — Z85828 Personal history of other malignant neoplasm of skin: Secondary | ICD-10-CM | POA: Diagnosis not present

## 2020-11-02 DIAGNOSIS — L814 Other melanin hyperpigmentation: Secondary | ICD-10-CM | POA: Diagnosis not present

## 2020-11-02 DIAGNOSIS — D2272 Melanocytic nevi of left lower limb, including hip: Secondary | ICD-10-CM | POA: Diagnosis not present

## 2020-11-02 DIAGNOSIS — L650 Telogen effluvium: Secondary | ICD-10-CM | POA: Diagnosis not present

## 2020-11-02 DIAGNOSIS — D692 Other nonthrombocytopenic purpura: Secondary | ICD-10-CM | POA: Diagnosis not present

## 2020-11-07 IMAGING — MG MM DIGITAL DIAGNOSTIC UNILAT*R* W/ TOMO W/ CAD
8 series · 9 of 24 positions shown · non-contrast
Comparison: Previous exam(s).

CLINICAL DATA: 73-year-old female recalled from screening mammogram
dated 09/30/2020 for possible right breast distortion.

EXAM:
DIGITAL DIAGNOSTIC UNILATERAL RIGHT MAMMOGRAM WITH TOMO AND CAD

[R ML synth-2D]
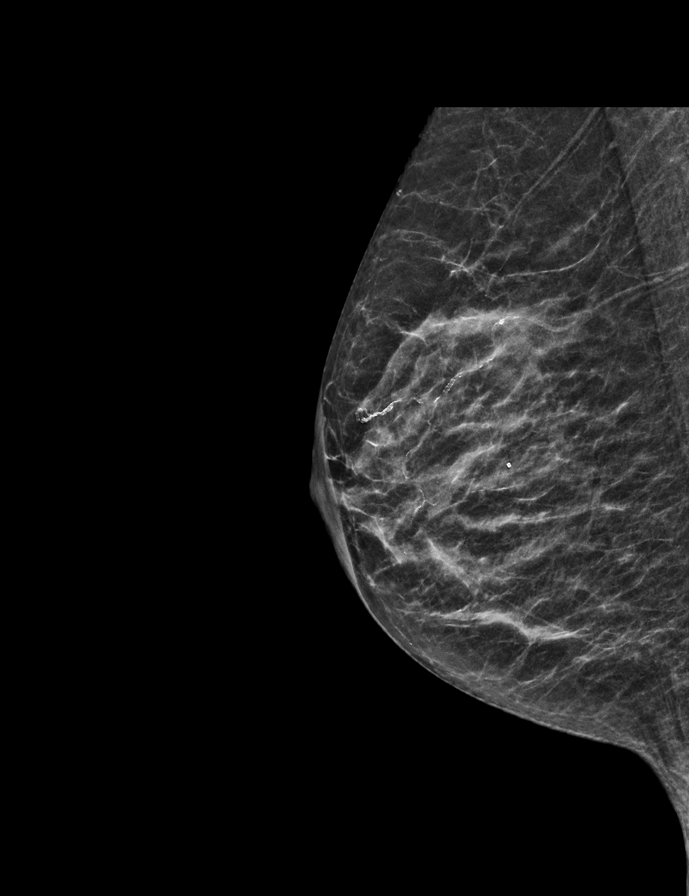

[R CC synth-2D]
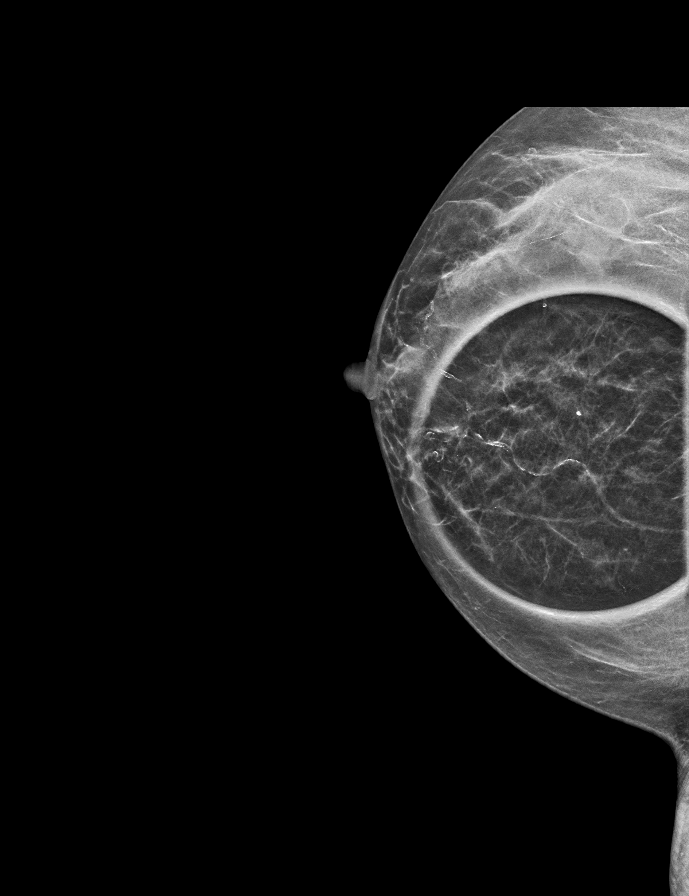

[R MLO synth-2D (1 of 2)]
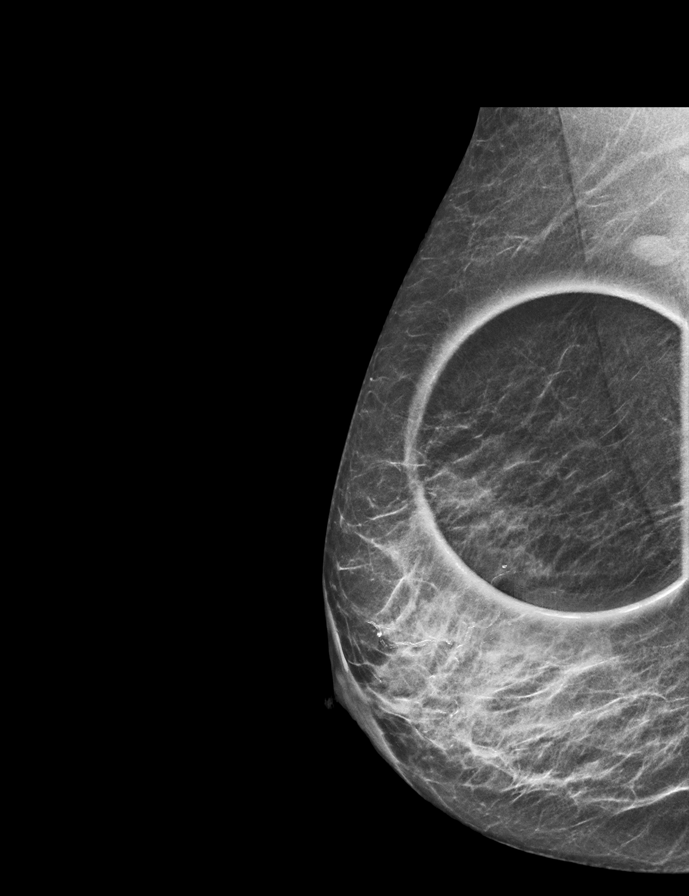

[R MLO synth-2D (2 of 2)]
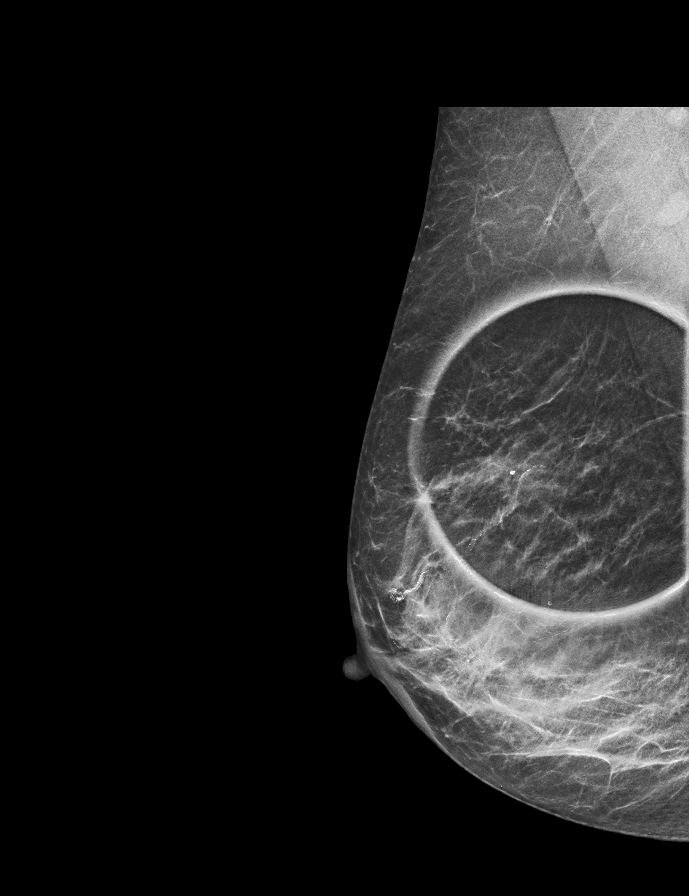

[R MLO tomo · 2 of 40 frames shown (1 of 2)]
[frame 13/40]
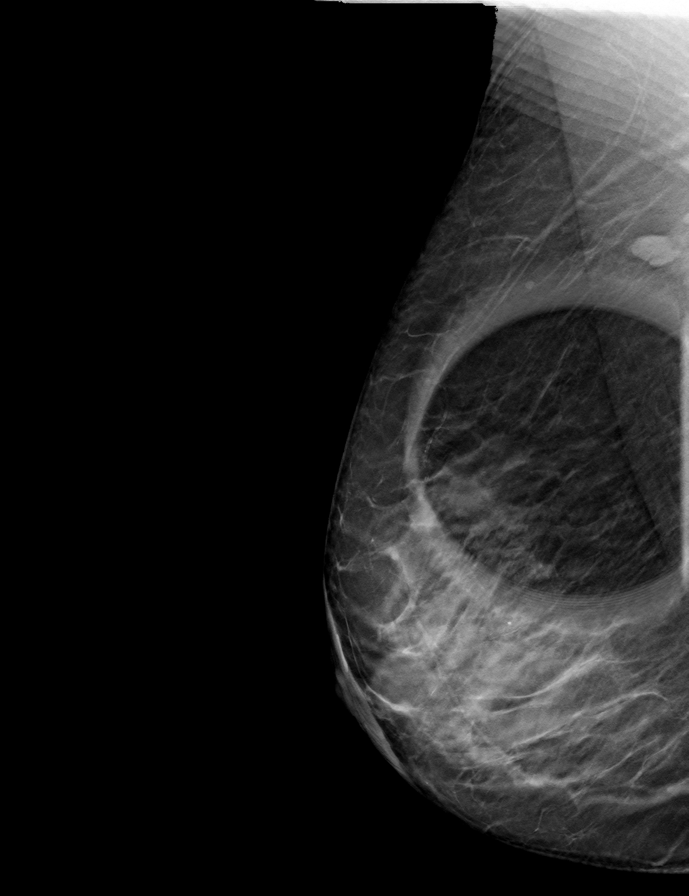
[frame 21/40]
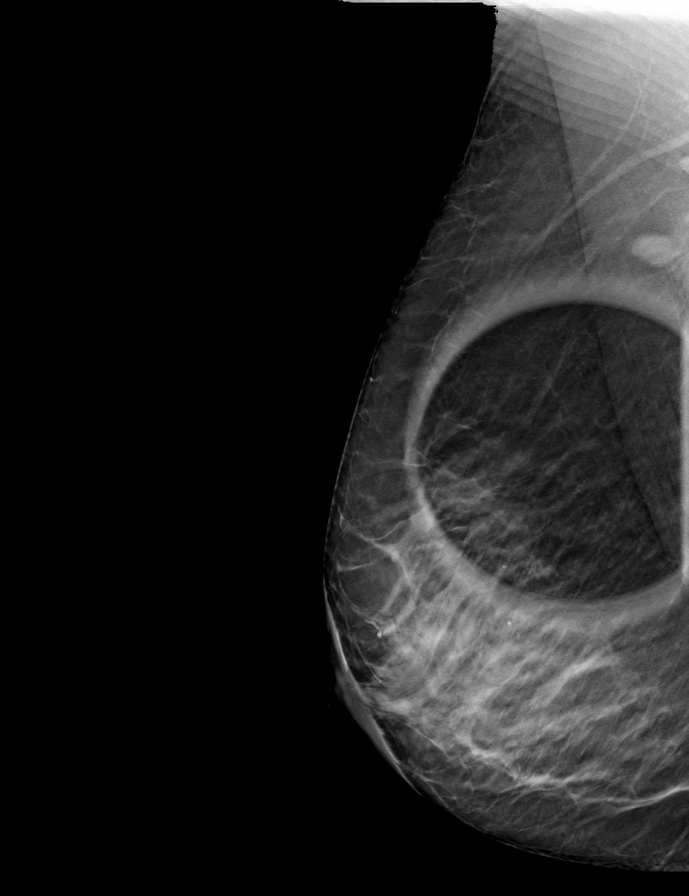

[R MLO tomo (2 of 2) · tomo slice 19/36.0]
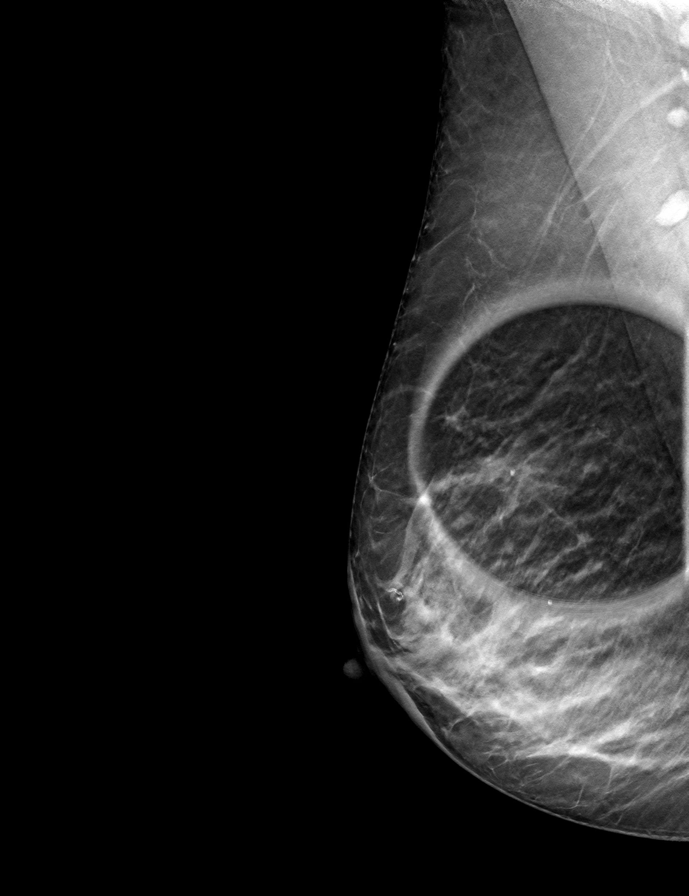

[R ML tomo · tomo slice 23/44.0]
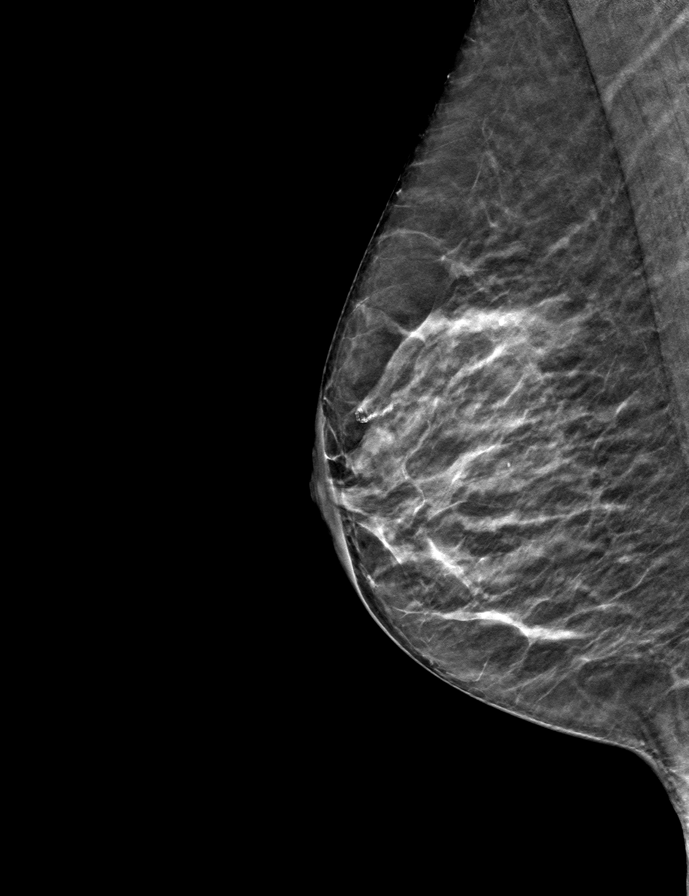

[R CC tomo · tomo slice 19/36.0]
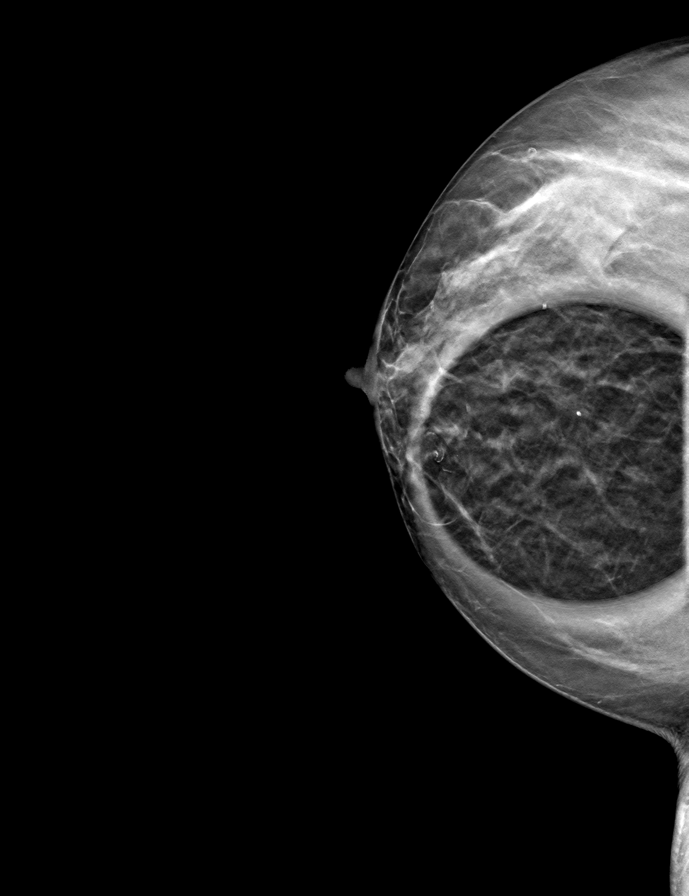

[9 of 24 positions shown; findings below may reference images not displayed]

ACR Breast Density Category c: The breast tissue is heterogeneously
dense, which may obscure small masses.
FINDINGS: Previously described, possible distortion in the upper, slightly
inner right breast at middle depth resolves into well dispersed
fibroglandular tissue on today's additional views. No persistent,
suspicious findings are identified.

Mammographic images were processed with CAD.
IMPRESSION: No mammographic evidence of malignancy.

RECOMMENDATION:
Screening mammogram in one year.(Code:[id number]-[id number])

I have discussed the findings and recommendations with the patient.
If applicable, a reminder letter will be sent to the patient
regarding the next appointment.

BI-RADS CATEGORY  1: Negative.

## 2021-01-10 DIAGNOSIS — Z01812 Encounter for preprocedural laboratory examination: Secondary | ICD-10-CM | POA: Diagnosis not present

## 2021-01-13 DIAGNOSIS — D125 Benign neoplasm of sigmoid colon: Secondary | ICD-10-CM | POA: Diagnosis not present

## 2021-01-13 DIAGNOSIS — K625 Hemorrhage of anus and rectum: Secondary | ICD-10-CM | POA: Diagnosis not present

## 2021-01-13 DIAGNOSIS — Z903 Acquired absence of stomach [part of]: Secondary | ICD-10-CM | POA: Diagnosis not present

## 2021-01-13 DIAGNOSIS — K640 First degree hemorrhoids: Secondary | ICD-10-CM | POA: Diagnosis not present

## 2021-01-13 DIAGNOSIS — Z85028 Personal history of other malignant neoplasm of stomach: Secondary | ICD-10-CM | POA: Diagnosis not present

## 2021-01-17 DIAGNOSIS — D125 Benign neoplasm of sigmoid colon: Secondary | ICD-10-CM | POA: Diagnosis not present

## 2021-02-14 DIAGNOSIS — K5900 Constipation, unspecified: Secondary | ICD-10-CM | POA: Diagnosis not present

## 2021-02-14 DIAGNOSIS — F3342 Major depressive disorder, recurrent, in full remission: Secondary | ICD-10-CM | POA: Diagnosis not present

## 2021-02-14 DIAGNOSIS — I700 Atherosclerosis of aorta: Secondary | ICD-10-CM | POA: Diagnosis not present

## 2021-02-14 DIAGNOSIS — E782 Mixed hyperlipidemia: Secondary | ICD-10-CM | POA: Diagnosis not present

## 2021-02-14 DIAGNOSIS — E673 Hypervitaminosis D: Secondary | ICD-10-CM | POA: Diagnosis not present

## 2021-02-14 DIAGNOSIS — K219 Gastro-esophageal reflux disease without esophagitis: Secondary | ICD-10-CM | POA: Diagnosis not present

## 2021-02-14 DIAGNOSIS — M351 Other overlap syndromes: Secondary | ICD-10-CM | POA: Diagnosis not present

## 2021-02-14 DIAGNOSIS — C169 Malignant neoplasm of stomach, unspecified: Secondary | ICD-10-CM | POA: Diagnosis not present

## 2021-02-14 DIAGNOSIS — N952 Postmenopausal atrophic vaginitis: Secondary | ICD-10-CM | POA: Diagnosis not present

## 2021-02-14 DIAGNOSIS — R634 Abnormal weight loss: Secondary | ICD-10-CM | POA: Diagnosis not present

## 2021-02-14 DIAGNOSIS — M8589 Other specified disorders of bone density and structure, multiple sites: Secondary | ICD-10-CM | POA: Diagnosis not present

## 2021-02-14 DIAGNOSIS — L659 Nonscarring hair loss, unspecified: Secondary | ICD-10-CM | POA: Diagnosis not present

## 2021-02-17 ENCOUNTER — Telehealth: Payer: Self-pay | Admitting: Nurse Practitioner

## 2021-02-17 NOTE — Telephone Encounter (Signed)
Called pt per 3/2 sch msg - no answer. Left message for patient to call back to reschedule appt.

## 2021-02-20 DIAGNOSIS — E559 Vitamin D deficiency, unspecified: Secondary | ICD-10-CM | POA: Diagnosis not present

## 2021-02-20 DIAGNOSIS — M8589 Other specified disorders of bone density and structure, multiple sites: Secondary | ICD-10-CM | POA: Diagnosis not present

## 2021-02-20 DIAGNOSIS — E782 Mixed hyperlipidemia: Secondary | ICD-10-CM | POA: Diagnosis not present

## 2021-02-20 DIAGNOSIS — F3342 Major depressive disorder, recurrent, in full remission: Secondary | ICD-10-CM | POA: Diagnosis not present

## 2021-02-20 DIAGNOSIS — L659 Nonscarring hair loss, unspecified: Secondary | ICD-10-CM | POA: Diagnosis not present

## 2021-02-20 DIAGNOSIS — R251 Tremor, unspecified: Secondary | ICD-10-CM | POA: Diagnosis not present

## 2021-02-20 DIAGNOSIS — C169 Malignant neoplasm of stomach, unspecified: Secondary | ICD-10-CM | POA: Diagnosis not present

## 2021-02-20 DIAGNOSIS — Z23 Encounter for immunization: Secondary | ICD-10-CM | POA: Diagnosis not present

## 2021-02-20 DIAGNOSIS — K219 Gastro-esophageal reflux disease without esophagitis: Secondary | ICD-10-CM | POA: Diagnosis not present

## 2021-02-20 DIAGNOSIS — M351 Other overlap syndromes: Secondary | ICD-10-CM | POA: Diagnosis not present

## 2021-04-14 ENCOUNTER — Other Ambulatory Visit: Payer: Self-pay

## 2021-04-14 ENCOUNTER — Emergency Department (HOSPITAL_COMMUNITY): Payer: Medicare Other

## 2021-04-14 ENCOUNTER — Emergency Department (HOSPITAL_COMMUNITY)
Admission: EM | Admit: 2021-04-14 | Discharge: 2021-04-15 | Disposition: A | Discharge status: Home / Self Care | Payer: Medicare Other | Attending: Emergency Medicine | Admitting: Emergency Medicine

## 2021-04-14 ENCOUNTER — Encounter (HOSPITAL_COMMUNITY): Payer: Self-pay

## 2021-04-14 DIAGNOSIS — Z9222 Personal history of monoclonal drug therapy: Secondary | ICD-10-CM | POA: Insufficient documentation

## 2021-04-14 DIAGNOSIS — R0981 Nasal congestion: Secondary | ICD-10-CM | POA: Diagnosis present

## 2021-04-14 DIAGNOSIS — R062 Wheezing: Secondary | ICD-10-CM | POA: Diagnosis not present

## 2021-04-14 DIAGNOSIS — J069 Acute upper respiratory infection, unspecified: Secondary | ICD-10-CM | POA: Diagnosis not present

## 2021-04-14 DIAGNOSIS — Z85028 Personal history of other malignant neoplasm of stomach: Secondary | ICD-10-CM | POA: Insufficient documentation

## 2021-04-14 DIAGNOSIS — U071 COVID-19: Secondary | ICD-10-CM | POA: Diagnosis not present

## 2021-04-14 DIAGNOSIS — Z87891 Personal history of nicotine dependence: Secondary | ICD-10-CM | POA: Diagnosis not present

## 2021-04-14 DIAGNOSIS — Z96651 Presence of right artificial knee joint: Secondary | ICD-10-CM | POA: Insufficient documentation

## 2021-04-14 NOTE — ED Triage Notes (Signed)
Emergency Medicine Provider Triage Evaluation Note  Claudie Brickhouse , a 75 y.o. female  was evaluated in triage.  Pt complains of nasal ingestion, sore throat, productive cough, subjective fevers and chills, tested positive for COVID yesterday and today.  Denies any recent sick contacts..  Review of Systems  Positive: Nasal ingestion fevers and chills Negative: Chest pain, nausea vomiting  Physical Exam  BP (!) 149/56   Pulse 64   Temp 99.6 F (37.6 C)   Resp 18   Ht 5' (1.524 m)   Wt 53.5 kg   SpO2 100%   BMI 23.05 kg/m  Gen:   Awake, no distress   HEENT:  Atraumatic  Resp:  Normal effort  Cardiac:  Normal rate  MSK:   Moves extremities without difficulty  Neuro:  Speech clear   Medical Decision Making  Medically screening exam initiated at 8:26 PM.  Appropriate orders placed.  Hector Shade Assad was informed that the remainder of the evaluation will be completed by another provider, this initial triage assessment does not replace that evaluation, and the importance of remaining in the ED until their evaluation is complete.  Clinical Impression  URI-like symptoms, lab and imaging have been ordered, patient need further work.  [facility] department.   Marcello Fennel, PA-C 04/14/21 2027

## 2021-04-14 NOTE — ED Triage Notes (Signed)
Pt reports covid positive test today and nasal congestion since Wednesday 4/27. Pt reports her PCP sent her to ER to have oxygen checked and for possible infusion.

## 2021-04-15 ENCOUNTER — Emergency Department (HOSPITAL_COMMUNITY)
Admission: EM | Admit: 2021-04-15 | Discharge: 2021-04-15 | Disposition: A | Discharge status: Home / Self Care | Payer: Medicare Other | Source: Home / Self Care | Attending: Emergency Medicine | Admitting: Emergency Medicine

## 2021-04-15 ENCOUNTER — Encounter (HOSPITAL_COMMUNITY): Payer: Self-pay | Admitting: Student

## 2021-04-15 DIAGNOSIS — Z96651 Presence of right artificial knee joint: Secondary | ICD-10-CM | POA: Insufficient documentation

## 2021-04-15 DIAGNOSIS — U071 COVID-19: Secondary | ICD-10-CM

## 2021-04-15 DIAGNOSIS — Z87891 Personal history of nicotine dependence: Secondary | ICD-10-CM | POA: Insufficient documentation

## 2021-04-15 DIAGNOSIS — Z85028 Personal history of other malignant neoplasm of stomach: Secondary | ICD-10-CM | POA: Insufficient documentation

## 2021-04-15 DIAGNOSIS — R062 Wheezing: Secondary | ICD-10-CM

## 2021-04-15 DIAGNOSIS — Z9222 Personal history of monoclonal drug therapy: Secondary | ICD-10-CM | POA: Insufficient documentation

## 2021-04-15 LAB — RESP PANEL BY RT-PCR (FLU A&B, COVID) ARPGX2
Influenza A by PCR: NEGATIVE
Influenza B by PCR: NEGATIVE
SARS Coronavirus 2 by RT PCR: POSITIVE — AB

## 2021-04-15 MED ORDER — BEBTELOVIMAB 175 MG/2 ML IV (EUA)
175.0000 mg | Freq: Once | INTRAMUSCULAR | Status: DC
  Filled 2021-04-15: qty 2

## 2021-04-15 MED ORDER — EPINEPHRINE 0.3 MG/0.3ML IJ SOAJ: 0.3000 mg | Freq: Once | INTRAMUSCULAR | Status: AC | PRN

## 2021-04-15 MED ORDER — FAMOTIDINE IN NACL 20-0.9 MG/50ML-% IV SOLN: 20.0000 mg | Freq: Once | INTRAVENOUS | Status: AC | PRN

## 2021-04-15 MED ORDER — BEBTELOVIMAB 175 MG/2 ML IV (EUA): 175.0000 mg | Freq: Once | INTRAMUSCULAR | Status: DC

## 2021-04-15 MED ORDER — METHYLPREDNISOLONE SODIUM SUCC 125 MG IJ SOLR: 125.0000 mg | Freq: Once | INTRAMUSCULAR | Status: DC | PRN

## 2021-04-15 MED ORDER — ALBUTEROL SULFATE HFA 108 (90 BASE) MCG/ACT IN AERS
2.0000 | INHALATION_SPRAY | Freq: Once | RESPIRATORY_TRACT | Status: DC | PRN
Start: 2021-04-15 — End: 2021-04-16

## 2021-04-15 MED ORDER — SODIUM CHLORIDE 0.9 % IV SOLN: INTRAVENOUS | Status: DC | PRN

## 2021-04-15 MED ORDER — FAMOTIDINE IN NACL 20-0.9 MG/50ML-% IV SOLN: 20.0000 mg | Freq: Once | INTRAVENOUS | Status: DC | PRN

## 2021-04-15 MED ORDER — BEBTELOVIMAB 175 MG/2 ML IV (EUA)
175.0000 mg | Freq: Once | INTRAMUSCULAR | Status: AC
  Administered 2021-04-15: 175 mg via INTRAVENOUS
  Filled 2021-04-15: qty 2

## 2021-04-15 MED ORDER — ALBUTEROL SULFATE HFA 108 (90 BASE) MCG/ACT IN AERS
2.0000 | INHALATION_SPRAY | Freq: Once | RESPIRATORY_TRACT | Status: AC
  Administered 2021-04-15: 2 via RESPIRATORY_TRACT
  Filled 2021-04-15: qty 6.7

## 2021-04-15 MED ORDER — DIPHENHYDRAMINE HCL 50 MG/ML IJ SOLN: 50.0000 mg | Freq: Once | INTRAMUSCULAR | Status: DC | PRN

## 2021-04-15 MED ORDER — EPINEPHRINE 0.3 MG/0.3ML IJ SOAJ: 0.3000 mg | Freq: Once | INTRAMUSCULAR | Status: DC | PRN

## 2021-04-15 MED ORDER — ALBUTEROL SULFATE HFA 108 (90 BASE) MCG/ACT IN AERS: 2.0000 | INHALATION_SPRAY | Freq: Once | RESPIRATORY_TRACT | Status: AC | PRN

## 2021-04-15 MED ORDER — DIPHENHYDRAMINE HCL 50 MG/ML IJ SOLN: 50.0000 mg | Freq: Once | INTRAMUSCULAR | Status: AC | PRN

## 2021-04-15 MED ORDER — METHYLPREDNISOLONE SODIUM SUCC 125 MG IJ SOLR: 125.0000 mg | Freq: Once | INTRAMUSCULAR | Status: AC | PRN

## 2021-04-15 NOTE — ED Triage Notes (Signed)
Patient reports she has covid and came to ED for infusion. Says she left prior to infusion because she was tired. Patient denies pain, mild covid symptoms. Vitals WNL.

## 2021-04-15 NOTE — ED Notes (Signed)
Patient removed herself from vitals monitor and proceeded to leave the building. Patient was spotted by this RN and encouraged to stay. Patient was informed we now have an order for the bebtelovimab injection and encouraged to stay for treatment. Patient stated "I'm tired of waiting for people who don't know what they are doing. The doctor mentioned pills and I haven't heard anything else". Patient ensured everything is being done to provide her the best care possible, but she refused treatment and left AMA.

## 2021-04-15 NOTE — ED Provider Notes (Signed)
[facility] DEPT Provider Note   CSN: [id number] Arrival date & time: 04/15/21  1230     History Chief Complaint  Patient presents with  . Covid Positive    Jane Howe is a 75 y.o. female with past medical history significant for recurrent bronchitis, lupus, hyperlipidemia presents for evaluation of requesting MAB treatment.  Had cold symptoms which started 3 days ago.  Has a positive PCP.  She was seen here in the emergency department yesterday evening approximate 14 hours ago requesting MAB treatment.  Unfortunately patient had to be retested for COVID has no results in the computer.  Patient states she was "too tired" after 2 hours waiting for the test and she left.  She returned here for her infusion.  Patient states she has had nasal congestion, sore throat, productive cough, chills.  She denies any fever, neck pain, neck stiffness, chest pain, shortness of breath, abdominal pain, diarrhea or dysuria.  She denies any additional aggravating or alleviating factors.  No recent syncope.  She is tolerating p.o. intake at home without difficulty.  History obtained from patient and past medical records.  No interpreter used  HPI     Past Medical History:  Diagnosis Date  . Basal cell carcinoma    back  . Depression   . Family history of adverse reaction to anesthesia    mother (in 42's felt like she couldn't breath after surgery, otherwise no specific details known)  . Family history of breast cancer   . Family history of colon cancer   . Family history of lung cancer   . Family history of melanoma   . Fibromyalgia   . 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 GERD (gastroesophageal reflux disease)   . Heart murmur   . Hyperlipidemia   . Lupus ([place])    no active Lupus symptoms  . Mixed connective tissue disease ([place])   . Osteoarthritis    knee, hand, and feet  . Stomach cancer [facility])     Patient Active Problem List   Diagnosis Date Noted   . 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 Genetic testing 12/29/2018  . 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 Family history of breast cancer   . Family history of colon cancer   . Family history of lung cancer   . Family history of melanoma   . Spondylolisthesis, lumbar region 08/27/2018  . Degeneration of lumbar intervertebral disc 08/27/2018  . History of total knee replacement, right 06/26/2018  . Pain in left knee 06/26/2018  . Full thickness rotator cuff tear 01/29/2018  . Biliary dyskinesia 11/20/2016  . OA (osteoarthritis) of knee 07/16/2016    Past Surgical History:  Procedure Laterality Date  . ABDOMINAL HYSTERECTOMY    . APPENDECTOMY    . BREAST EXCISIONAL BIOPSY Left 07/1991  . BREAST SURGERY Left    biopsy  . CHOLECYSTECTOMY N/A 11/22/2016   Procedure: LAPAROSCOPIC CHOLECYSTECTOMY WITH INTRAOPERATIVE CHOLANGIOGRAM;  Surgeon: Armandina Gemma, MD;  Location: [facility];  Service: General;  Laterality: N/A;  . GASTRECTOMY N/A 01/01/2019   Procedure: OPEN PARTIAL GASTRECTOMY WITH POSSIBLE FEEDING TUBE;  Surgeon: [name], MD;  Location: [facility];  Service: General;  Laterality: N/A;  . LAPAROSCOPY N/A 01/01/2019   Procedure: LAPAROSCOPY DIAGNOSTIC;  Surgeon: [name], MD;  Location: [facility];  Service: General;  Laterality: N/A;  . ROTATOR CUFF REPAIR Right 2018  . TONSILLECTOMY     and adnoids  . TOTAL KNEE ARTHROPLASTY  Right 07/16/2016   Procedure: RIGHT TOTAL KNEE ARTHROPLASTY;  Surgeon: Gaynelle Arabian, MD;  Location: [facility];  Service: Orthopedics;  Laterality: Right;     OB History   No obstetric history on file.     Family History  Problem Relation Age of Onset  . Breast cancer Mother 5       used tamoxifen, thinks it was ductal but not sure  . Breast cancer Sister 69       again, triple neg at age 18.  GT neg 'breast genes only'  . Lung cancer Paternal Uncle 20  . Stroke Maternal Grandmother   . Heart disease Maternal Grandfather   . Stroke  Paternal Grandmother   . Breast cancer Cousin 31  . Colon cancer Cousin 72  . Melanoma Daughter 57  . Squamous cell carcinoma Daughter   . Breast cancer Cousin 30  . Ovarian cancer Cousin   . Breast cancer Maternal Aunt        dx >50  . Hodgkin's lymphoma Maternal Aunt     Social History   Tobacco Use  . Smoking status: Former Smoker    Packs/day: 1.00    Years: 15.00    Pack years: 15.00    Types: Cigarettes    Quit date: 04/18/1991    Years since quitting: 30.0  . Smokeless tobacco: Never Used  Vaping Use  . Vaping Use: Never used  Substance Use Topics  . Alcohol use: Yes    Comment: 0-1 mixed drink week  . Drug use: No    Home Medications Prior to Admission medications   Medication Sig Start Date End Date Taking? Authorizing Provider  ALPRAZolam Duanne Moron) 0.25 MG tablet Take 0.25 mg by mouth at bedtime as needed for sleep.     [provider]  buPROPion (WELLBUTRIN SR) 100 MG 12 hr tablet Take 100 mg by mouth daily. 09/14/20   [provider]  calcium carbonate (OSCAL) 1500 (600 Ca) MG TABS tablet Take 600 mg of elemental calcium by mouth at bedtime.    [provider]  cyclobenzaprine (FLEXERIL) 10 MG tablet Take 1 tablet (10 mg total) by mouth 3 (three) times daily as needed for muscle spasms. Patient taking differently: Take 10 mg by mouth at bedtime.  07/17/16   Perkins, Alexzandrew L, PA-C  DULoxetine (CYMBALTA) 30 MG capsule Take 90 mg by mouth daily.     [provider]  esomeprazole (NEXIUM) 40 MG capsule Take 40 mg by mouth daily at 12 noon.    [provider]  Melatonin 5 MG TABS Take 5 mg by mouth at bedtime as needed.     [provider]  ondansetron (ZOFRAN-ODT) 8 MG disintegrating tablet Take 1 tablet (8 mg total) by mouth every 8 (eight) hours as needed for nausea or vomiting. 10/21/20   Ladell Pier, MD  Polyethylene Glycol 3350 (MIRALAX PO) Take 17 g by mouth daily.     [provider]   pravastatin (PRAVACHOL) 40 MG tablet Take 40 mg by mouth every other day.  08/29/16   [provider]  Probiotic Product (PROBIOTIC DAILY PO) Take 1 tablet by mouth daily.    [provider]  propranolol (INDERAL) 10 MG tablet Take 10 mg by mouth daily as needed (non-essential tremors).    [provider]  traMADol (ULTRAM) 50 MG tablet Take 1 tablet (50 mg total) by mouth every 6 (six) hours as needed (mild pain). 01/08/19   [name], MD  traZODone (DESYREL) 100 MG tablet Take 100 mg by mouth at bedtime.    [provider]    Allergies    Reglan [metoclopramide]  Review of Systems   Review of Systems  Constitutional: Positive for chills and fatigue.  HENT: Positive for congestion and rhinorrhea.   Respiratory: Positive for cough.   Cardiovascular: Negative.   Gastrointestinal: Negative.   Genitourinary: Negative.   Musculoskeletal: Positive for myalgias.  Skin: Negative.   Neurological: Negative.   All other systems reviewed and are negative.   Physical Exam Updated Vital Signs BP (!) 115/91 (BP Location: Right Arm)   Pulse 63   Temp 99.1 F (37.3 C) (Oral)   Resp 18   Ht 5' (1.524 m)   Wt 53.5 kg   SpO2 97%   BMI 23.05 kg/m   Physical Exam Vitals and nursing note reviewed.  Constitutional:      General: She is not in acute distress.    Appearance: She is not ill-appearing, toxic-appearing or diaphoretic.  HENT:     Head: Normocephalic and atraumatic.     Jaw: There is normal jaw occlusion.     Right Ear: Tympanic membrane, ear canal and external ear normal. There is no impacted cerumen. No hemotympanum. Tympanic membrane is not injected, scarred, perforated, erythematous, retracted or bulging.     Left Ear: Tympanic membrane, ear canal and external ear normal. There is no impacted cerumen. No hemotympanum. Tympanic membrane is not injected, scarred, perforated, erythematous, retracted or bulging.     Ears:     Comments: No  Mastoid tenderness.    Nose:     Comments: Clear rhinorrhea and congestion to bilateral nares.  No sinus tenderness.    Mouth/Throat:     Comments: Posterior oropharynx clear.  Mucous membranes moist.  Tonsils without erythema or exudate.  Uvula midline without deviation.  No evidence of PTA or RPA.  No drooling, dysphasia or trismus.  Phonation normal. Neck:     Trachea: Trachea and phonation normal.     Comments: No Neck stiffness or neck rigidity.  No meningismus.  No cervical lymphadenopathy. Cardiovascular:     Comments: No murmurs rubs or gallops. Pulmonary:     Effort: Pulmonary effort is normal.     Breath sounds: Normal air entry. Wheezing present.     Comments: Minimal expiratory wheeze. No accessory muscle usage.  Able speak in full sentences. Abdominal:     Comments: Soft, nontender without rebound or guarding.  No CVA tenderness.  Musculoskeletal:     Cervical back: Full passive range of motion without pain and normal range of motion.     Comments: Moves all 4 extremities without difficulty.  Lower extremities without edema, erythema or warmth.  Skin:    Comments: Brisk capillary refill.  No rashes or lesions.  Neurological:     Mental Status: She is alert.     Comments: Ambulatory in department without difficulty.  Cranial nerves II through XII grossly intact.  No facial droop.  No aphasia.     ED Results / Procedures / Treatments   Labs (all labs ordered are listed, but only abnormal results are displayed) Labs Reviewed - No data to display  EKG None  Radiology DG Chest Portable 1 View  Result Date: 04/14/2021 CLINICAL DATA:  COVID positive test today with nasal congestion. EXAM: PORTABLE CHEST 1 VIEW COMPARISON:  February 23, 2014 FINDINGS: The heart size and mediastinal contours are within normal limits. Both lungs are clear. Radiopaque surgical  clips are seen overlying the right upper quadrant. The visualized skeletal structures are unremarkable. IMPRESSION: No  active disease. Electronically Signed   By: Virgina Norfolk M.D.   On: 04/14/2021 21:09    Procedures Procedures   Medications Ordered in ED Medications  albuterol (VENTOLIN HFA) 108 (90 Base) MCG/ACT inhaler 2 puff (2 puffs Inhalation Given 04/15/21 1630)    ED Course  I have reviewed the triage vital signs and the nursing notes.  Pertinent labs & imaging results that were available during my care of the patient were reviewed by me and considered in my medical decision making (see chart for details).  Patient here for evaluation of requesting monoclonal antibody treatment.  Tested positive for COVID yesterday.  Symptoms began 3 days ago.  She is afebrile, nonseptic, non-ill-appearing.  Her heart is clear.  She does have very minimal expiratory wheeze however no acute respiratory distress.  Her abdomen is soft, nontender.  She has no neck stiffness or neck rigidity.  She has no meningismus.  Her posterior oropharynx is clear.  She has no evidence of pharyngitis, PTA or RPA.  She is a nonfocal neuro exam without deficits.  Positive COVID test in epic.  Will place orders for medical antibody.  We will also give albuterol given wheeze.  She had chest x-ray which was obtained 12 hours ago which does not show any evidence of infiltrates, cardiomegaly, pulm edema, pneumothorax.    Care transferred to [facility] [place] who will follow up reevaluation after MAB infusion. Anticipate likely dc home    MDM Rules/Calculators/A&P                          Jane Howe was evaluated in Emergency Department on 04/15/2021 for the symptoms described in the history of present illness. She was evaluated in the context of the global COVID-19 pandemic, which necessitated consideration that the patient might be at risk for infection with the SARS-CoV-2 virus that causes COVID-19. Institutional protocols and algorithms that pertain to the evaluation of patients at risk for COVID-19 are in a state of rapid  change based on information released by regulatory bodies including the CDC and federal and state organizations. These policies and algorithms were followed during the patient's care in the ED. Final Clinical Impression(s) / ED Diagnoses Final diagnoses:  COVID  Wheeze    Rx / DC Orders ED Discharge Orders    None       Isabela Nardelli A, PA-C 04/15/21 [place], [place], DO 04/15/21 1706

## 2021-04-15 NOTE — ED Provider Notes (Signed)
  Care of patient assumed from PA H[place].  Agree with history, physical exam and plan.  See their note for further details. Briefly, patient is COVID symptoms x3 days, tested positive yesterday.  Patient came to emergency department for monoclonal antibody treatment.   Physical Exam  BP (!) 115/91 (BP Location: Right Arm)   Pulse 63   Temp 99.1 F (37.3 C) (Oral)   Resp 18   Ht 5' (1.524 m)   Wt 53.5 kg   SpO2 97%   BMI 23.05 kg/m   Physical Exam Vitals and nursing note reviewed.  Constitutional:      General: She is not in acute distress.    Appearance: She is not ill-appearing, toxic-appearing or diaphoretic.  HENT:     Head: Normocephalic.  Eyes:     General: No scleral icterus.       Right eye: No discharge.        Left eye: No discharge.  Cardiovascular:     Rate and Rhythm: Normal rate.  Pulmonary:     Effort: Pulmonary effort is normal. No respiratory distress.     Breath sounds: No stridor.  Skin:    Coloration: Skin is not jaundiced or pale.  Neurological:     General: No focal deficit present.     Mental Status: She is alert.     GCS: GCS eye subscore is 4. GCS verbal subscore is 5. GCS motor subscore is 6.  Psychiatric:        Behavior: Behavior is cooperative.     ED Course/Procedures     Procedures  MDM   Monoclonal antibody treatment was ordered for patient prior to handoff.  Was informed by RN that patient had completed her treatment.  Upon reassessment patient is in no acute distress and hemodynamically stable.  Will discharge patient home, patient advised to follow-up with her primary care provider as needed.  Patient given strict return precautions.       Loni Beckwith, PA-C 04/16/21 [place], [place], DO 04/16/21 1504

## 2021-04-15 NOTE — ED Provider Notes (Signed)
[facility] DEPT Provider Note   CSN: [id number] Arrival date & time: 04/14/21  1943     History Chief Complaint  Patient presents with  . Covid Positive    Jane Howe is a 75 y.o. female with a history of gastric cancer, lupus, mixed connective tissue disease, and osteoarthritis who presents to the emergency department requesting monoclonal antibody infusion with positive COVID-19 test yesterday.  Patient states that she started to feel sick 04/27, she is experiencing symptoms including nasal congestion, sore throat, productive cough, sweats/chills, and some chest congestion.  No significant alleviating or aggravating factors to her symptoms.  She went to her primary care doctor for her COVID test, resulted positive, and she was told to come to the emergency department for antibody infusion.  Her daughter would really like her to get this as well.  She denies chest pain, dyspnea, dizziness, hemoptysis, syncope, vomiting, abdominal pain, or diarrhea.  HPI     Past Medical History:  Diagnosis Date  . Basal cell carcinoma    back  . Depression   . Family history of adverse reaction to anesthesia    mother (in 48's felt like she couldn't breath after surgery, otherwise no specific details known)  . Family history of breast cancer   . Family history of colon cancer   . Family history of lung cancer   . Family history of melanoma   . Fibromyalgia   . Gastric cancer ([place]) 12/15/2018  . GERD (gastroesophageal reflux disease)   . Heart murmur   . Hyperlipidemia   . Lupus ([place])    no active Lupus symptoms  . Mixed connective tissue disease ([place])   . Osteoarthritis    knee, hand, and feet  . Stomach cancer [facility])     Patient Active Problem List   Diagnosis Date Noted  . Malignant neoplasm of antrum of stomach ([place]) 01/01/2019  . Genetic testing 12/29/2018  . Gastric cancer ([place]) 12/15/2018  . Family history of breast cancer   . Family  history of colon cancer   . Family history of lung cancer   . Family history of melanoma   . Spondylolisthesis, lumbar region 08/27/2018  . Degeneration of lumbar intervertebral disc 08/27/2018  . History of total knee replacement, right 06/26/2018  . Pain in left knee 06/26/2018  . Full thickness rotator cuff tear 01/29/2018  . Biliary dyskinesia 11/20/2016  . OA (osteoarthritis) of knee 07/16/2016    Past Surgical History:  Procedure Laterality Date  . ABDOMINAL HYSTERECTOMY    . APPENDECTOMY    . BREAST EXCISIONAL BIOPSY Left 07/1991  . BREAST SURGERY Left    biopsy  . CHOLECYSTECTOMY N/A 11/22/2016   Procedure: LAPAROSCOPIC CHOLECYSTECTOMY WITH INTRAOPERATIVE CHOLANGIOGRAM;  Surgeon: Armandina Gemma, MD;  Location: [facility];  Service: General;  Laterality: N/A;  . GASTRECTOMY N/A 01/01/2019   Procedure: OPEN PARTIAL GASTRECTOMY WITH POSSIBLE FEEDING TUBE;  Surgeon: [name], MD;  Location: [facility];  Service: General;  Laterality: N/A;  . LAPAROSCOPY N/A 01/01/2019   Procedure: LAPAROSCOPY DIAGNOSTIC;  Surgeon: [name], MD;  Location: [facility];  Service: General;  Laterality: N/A;  . ROTATOR CUFF REPAIR Right 2018  . TONSILLECTOMY     and adnoids  . TOTAL KNEE ARTHROPLASTY Right 07/16/2016   Procedure: RIGHT TOTAL KNEE ARTHROPLASTY;  Surgeon: Gaynelle Arabian, MD;  Location: [facility];  Service: Orthopedics;  Laterality: Right;     OB History   No obstetric history on file.  Family History  Problem Relation Age of Onset  . Breast cancer Mother 64       used tamoxifen, thinks it was ductal but not sure  . Breast cancer Sister 59       again, triple neg at age 81.  GT neg 'breast genes only'  . Lung cancer Paternal Uncle 73  . Stroke Maternal Grandmother   . Heart disease Maternal Grandfather   . Stroke Paternal Grandmother   . Breast cancer Cousin 28  . Colon cancer Cousin 13  . Melanoma Daughter 50  . Squamous cell carcinoma Daughter   . Breast cancer Cousin 30  .  Ovarian cancer Cousin   . Breast cancer Maternal Aunt        dx >50  . Hodgkin's lymphoma Maternal Aunt     Social History   Tobacco Use  . Smoking status: Former Smoker    Packs/day: 1.00    Years: 15.00    Pack years: 15.00    Types: Cigarettes    Quit date: 04/18/1991    Years since quitting: 30.0  . Smokeless tobacco: Never Used  Vaping Use  . Vaping Use: Never used  Substance Use Topics  . Alcohol use: Yes    Comment: 0-1 mixed drink week  . Drug use: No    Home Medications Prior to Admission medications   Medication Sig Start Date End Date Taking? Authorizing Provider  ALPRAZolam Duanne Moron) 0.25 MG tablet Take 0.25 mg by mouth at bedtime as needed for sleep.     [provider]  buPROPion (WELLBUTRIN SR) 100 MG 12 hr tablet Take 100 mg by mouth daily. 09/14/20   [provider]  calcium carbonate (OSCAL) 1500 (600 Ca) MG TABS tablet Take 600 mg of elemental calcium by mouth at bedtime.    [provider]  cyclobenzaprine (FLEXERIL) 10 MG tablet Take 1 tablet (10 mg total) by mouth 3 (three) times daily as needed for muscle spasms. Patient taking differently: Take 10 mg by mouth at bedtime.  07/17/16   Perkins, Alexzandrew L, PA-C  DULoxetine (CYMBALTA) 30 MG capsule Take 90 mg by mouth daily.     [provider]  esomeprazole (NEXIUM) 40 MG capsule Take 40 mg by mouth daily at 12 noon.    [provider]  Melatonin 5 MG TABS Take 5 mg by mouth at bedtime as needed.     [provider]  ondansetron (ZOFRAN-ODT) 8 MG disintegrating tablet Take 1 tablet (8 mg total) by mouth every 8 (eight) hours as needed for nausea or vomiting. 10/21/20   Ladell Pier, MD  Polyethylene Glycol 3350 (MIRALAX PO) Take 17 g by mouth daily.     [provider]  pravastatin (PRAVACHOL) 40 MG tablet Take 40 mg by mouth every other day.  08/29/16   [provider]  Probiotic Product (PROBIOTIC DAILY PO) Take 1 tablet by mouth daily.     [provider]  propranolol (INDERAL) 10 MG tablet Take 10 mg by mouth daily as needed (non-essential tremors).    [provider]  traMADol (ULTRAM) 50 MG tablet Take 1 tablet (50 mg total) by mouth every 6 (six) hours as needed (mild pain). 01/08/19   [name], MD  traZODone (DESYREL) 100 MG tablet Take 100 mg by mouth at bedtime.    [provider]    Allergies    Reglan [metoclopramide]  Review of Systems   Review of Systems  Constitutional: Positive for chills.  HENT: Positive for congestion and sore throat.   Respiratory: Positive for cough. Negative for shortness of breath.   Cardiovascular: Negative for chest pain and leg swelling.  Gastrointestinal: Negative for abdominal pain, diarrhea, nausea and vomiting.  Neurological: Negative for dizziness and syncope.  All other systems reviewed and are negative.   Physical Exam Updated Vital Signs BP (!) 143/60   Pulse 67   Temp 99.6 F (37.6 C)   Resp 10   Ht 5' (1.524 m)   Wt 53.5 kg   SpO2 99%   BMI 23.05 kg/m   Physical Exam Vitals and nursing note reviewed.  Constitutional:      General: She is not in acute distress.    Appearance: She is well-developed. She is not toxic-appearing.  HENT:     Head: Normocephalic and atraumatic.  Eyes:     General:        Right eye: No discharge.        Left eye: No discharge.     Conjunctiva/sclera: Conjunctivae normal.  Cardiovascular:     Rate and Rhythm: Normal rate and regular rhythm.  Pulmonary:     Effort: Pulmonary effort is normal. No respiratory distress.     Breath sounds: Normal breath sounds. No wheezing, rhonchi or rales.  Abdominal:     General: There is no distension.     Palpations: Abdomen is soft.     Tenderness: There is no abdominal tenderness. There is no guarding or rebound.  Musculoskeletal:     Cervical back: Neck supple.  Skin:    General: Skin is warm and dry.     Findings: No rash.  Neurological:     Mental  Status: She is alert.     Comments: Clear speech.   Psychiatric:        Behavior: Behavior normal.     ED Results / Procedures / Treatments   Labs (all labs ordered are listed, but only abnormal results are displayed) Labs Reviewed  RESP PANEL BY RT-PCR (FLU A&B, COVID) ARPGX2 - Abnormal; Notable for the following components:      Result Value   SARS Coronavirus 2 by RT PCR POSITIVE (*)    All other components within normal limits    EKG None  Radiology DG Chest Portable 1 View  Result Date: 04/14/2021 CLINICAL DATA:  COVID positive test today with nasal congestion. EXAM: PORTABLE CHEST 1 VIEW COMPARISON:  February 23, 2014 FINDINGS: The heart size and mediastinal contours are within normal limits. Both lungs are clear. Radiopaque surgical clips are seen overlying the right upper quadrant. The visualized skeletal structures are unremarkable. IMPRESSION: No active disease. Electronically Signed   By: Virgina Norfolk M.D.   On: 04/14/2021 21:09    Procedures Procedures   Medications Ordered in ED Medications  bebtelovimab EUA injection SOLN 175 mg (has no administration in time range)  0.9 %  sodium chloride infusion (has no administration in time range)  diphenhydrAMINE (BENADRYL) injection 50 mg (has no administration in time range)  famotidine (PEPCID) IVPB 20 mg premix (has no administration in time range)  methylPREDNISolone sodium succinate (SOLU-MEDROL) 125 mg/2 mL injection 125 mg (has no administration in time range)  albuterol (VENTOLIN HFA) 108 (90 Base) MCG/ACT inhaler 2 puff (has no administration in time range)  EPINEPHrine (EPI-PEN) injection 0.3 mg (has no administration in time range)  0.9 %  sodium chloride infusion (has no administration in time range)  diphenhydrAMINE (BENADRYL) injection 50 mg (has no administration in  time range)  famotidine (PEPCID) IVPB 20 mg premix (has no administration in time range)  methylPREDNISolone sodium succinate (SOLU-MEDROL)  125 mg/2 mL injection 125 mg (has no administration in time range)  albuterol (VENTOLIN HFA) 108 (90 Base) MCG/ACT inhaler 2 puff (has no administration in time range)  EPINEPHrine (EPI-PEN) injection 0.3 mg (has no administration in time range)  bebtelovimab EUA injection SOLN 175 mg (has no administration in time range)    ED Course  I have reviewed the triage vital signs and the nursing notes.  Pertinent labs & imaging results that were available during my care of the patient were reviewed by me and considered in my medical decision making (see chart for details).  Winta Barcelo was evaluated in Emergency Department on 04/15/2021 for the symptoms described in the history of present illness. He/she was evaluated in the context of the global COVID-19 pandemic, which necessitated consideration that the patient might be at risk for infection with the SARS-CoV-2 virus that causes COVID-19. Institutional protocols and algorithms that pertain to the evaluation of patients at risk for COVID-19 are in a state of rapid change based on information released by regulatory bodies including the CDC and federal and state organizations. These policies and algorithms were followed during the patient's care in the ED.    MDM Rules/Calculators/A&P                          Patient presents to the ED requesting MAB infusion. + for COVID 19, first day of sxs 04/12/21. Nontoxic, vitals w/ mildly elevated BP- doubt HTN emergency. Lungs clear. Ambulatory SpO2 99-100% on RA without increased work of breathing does not appear to require admission at this time. Discussed risk/benefits of MAB, patient would like to proceed- this has been ordered per discussion with pharmacist Sharyn Lull Seth Bake).   Additional history obtained:  Additional history obtained from chart review & nursing note review.   Lab Tests:  I reviewed and interpreted labs, which included:  COVID: Positive  Imaging Studies ordered:  CXR ordered  by triage, I independently reviewed, formal radiology impression shows: No active disease  ED Course:  Per nursing staff patient eloped form the ED prior to receiving MAB.   Portions of this note were generated with [employer]. Dictation errors may occur despite best attempts at proofreading.  Final Clinical Impression(s) / ED Diagnoses Final diagnoses:  COVID-19    Rx / DC Orders ED Discharge Orders         Ordered    BEBTELOVIMAB 175 MG/2 ML IV (EUA)   Once,   Status:  Discontinued        04/15/21 0056    BEBTELOVIMAB 175 MG/2 ML IV (EUA)   Once        04/15/21 0048    SODIUM CHLORIDE 0.9 % IV SOLN  As needed        04/15/21 0056    DIPHENHYDRAMINE HCL 50 MG/ML IJ SOLN  Once PRN        04/15/21 0056    FAMOTIDINE IN NACL 20-0.9 MG/50ML-% IV SOLN  Once PRN        04/15/21 0056    METHYLPREDNISOLONE SODIUM SUCC 125 MG IJ SOLR  Once PRN        04/15/21 0056    ALBUTEROL SULFATE HFA 108 (90 BASE) MCG/ACT IN AERS  Once PRN        04/15/21 0056    EPINEPHRINE 0.3 MG/0.3ML  IJ SOAJ  Once PRN        04/15/21 0056    SODIUM CHLORIDE 0.9 % IV SOLN  As needed        04/15/21 0048    DIPHENHYDRAMINE HCL 50 MG/ML IJ SOLN  Once PRN        04/15/21 0048    FAMOTIDINE IN NACL 20-0.9 MG/50ML-% IV SOLN  Once PRN        04/15/21 0048    METHYLPREDNISOLONE SODIUM SUCC 125 MG IJ SOLR  Once PRN        04/15/21 0048    ALBUTEROL SULFATE HFA 108 (90 BASE) MCG/ACT IN AERS  Once PRN        04/15/21 0048    EPINEPHRINE 0.3 MG/0.3ML IJ SOAJ  Once PRN        04/15/21 0048    Hypersensitivity GRADE 1: Transient flushing or rash, or drug fever < 100.4 F  Status:  Canceled       Comments: Routine, As needed For Until specified Hold infusion for at least 30 minutes Restart infusion at 50% infusion rate if asymptomatic   04/15/21 0048    Hypersensitivity GRADE 2: Rash, flushing, urticaria, dyspnea, or drug fever = or > 100.4 F  Status:  Canceled       Comments: Routine, As needed For  Until specified Hold infusion for 30 minutes Give medications as ordered   04/15/21 0048    Hypersensitivity GRADE 3: symptomatic bronchospasm, with or without urticaria, parenteral medication management indicated, allergy-related edema/angioedema, or hypotension  Status:  Canceled       Comments: Routine, As needed For Until specified Hold infusion for at least 30 minutes Give medications as ordered   04/15/21 0048    Hypersensitivity GRADE 4: Anaphylaxis  Status:  Canceled       Comments: Routine, As needed For Until specified Hold infusion Give medications as ordered Call MD   04/15/21 0048    Hypersensitivity GRADE 1: Transient flushing or rash, or drug fever < 100.4 F  Status:  Canceled       Comments: Routine, As needed For Until specified Hold infusion for at least 30 minutes Restart infusion at 50% infusion rate if asymptomatic   04/15/21 0056    Hypersensitivity GRADE 2: Rash, flushing, urticaria, dyspnea, or drug fever = or > 100.4 F  Status:  Canceled       Comments: Routine, As needed For Until specified Hold infusion for 30 minutes Give medications as ordered   04/15/21 0056    Hypersensitivity GRADE 3: symptomatic bronchospasm, with or without urticaria, parenteral medication management indicated, allergy-related edema/angioedema, or hypotension  Status:  Canceled       Comments: Routine, As needed For Until specified Hold infusion for at least 30 minutes Give medications as ordered   04/15/21 0056    Hypersensitivity GRADE 4: Anaphylaxis  Status:  Canceled       Comments: Routine, As needed For Until specified Hold infusion Give medications as ordered Call MD   04/15/21 0056           Amaryllis Dyke, PA-C 04/15/21 Kyung Rudd, MD 04/15/21 [phone]

## 2021-04-15 NOTE — Discharge Instructions (Signed)
You came to the emergency department today to receive monoclonal antibody infusion for your COVID-19.     Please isolate at home for at least 7 days after the day your symptoms initially began, and THEN at least 24 hours after you are fever-free without the help of medications (Tylenol/acetaminophen and Advil/ibuprofen/Motrin) AND your symptoms are improving.  You can alternate Tylenol/acetaminophen and Advil/ibuprofen/Motrin every 4 hours for sore throat, body aches, headache or fever.  Drink plenty of water.  Use saline nasal spray for congestion. Wash your hands frequently. Please rest as needed with frequent repositioning and ambulation as tolerated.    If you use a CPAP or BiPAP device for management of obstructive sleep apnea may continue to use it however use it when isolated from other individuals to avoid spread of COVID-19.   If you use a nebulizer administer medication such as albuterol you may continue to use it however only one isolated from other individuals to avoid the spread of COVID-19.  If your symptoms do not improve please follow-up with your primary care provider or urgent care.  Return to the ER for significant shortness of breath, uncontrollable vomiting, severe chest pain, inability to tolerate fluids, changes in mental status such as confusion or other concerning symptoms.

## 2021-04-17 ENCOUNTER — Inpatient Hospital Stay: Payer: Medicare Other | Admitting: Oncology

## 2021-04-20 ENCOUNTER — Ambulatory Visit: Payer: Medicare Other | Admitting: Nurse Practitioner

## 2021-05-08 ENCOUNTER — Inpatient Hospital Stay: Payer: Medicare Other | Attending: Oncology | Admitting: Oncology

## 2021-05-08 ENCOUNTER — Other Ambulatory Visit: Payer: Self-pay

## 2021-05-08 VITALS — BP 134/55 | HR 58 | Temp 97.8°F | Resp 18 | Ht 60.0 in | Wt 116.8 lb

## 2021-05-08 DIAGNOSIS — Z8616 Personal history of COVID-19: Secondary | ICD-10-CM | POA: Diagnosis not present

## 2021-05-08 DIAGNOSIS — C162 Malignant neoplasm of body of stomach: Secondary | ICD-10-CM | POA: Diagnosis not present

## 2021-05-08 DIAGNOSIS — R911 Solitary pulmonary nodule: Secondary | ICD-10-CM | POA: Diagnosis not present

## 2021-05-08 NOTE — Progress Notes (Signed)
  [facility] OFFICE PROGRESS NOTE   Diagnosis: Gastric cancer  INTERVAL HISTORY:   Jane Howe returns as scheduled.  She feels well.  Good appetite.  She continues to have nausea in the mornings.  No new pain.  She was diagnosed with COVID-19 in April.  She received monoclonal antibody therapy.  The left neck fullness has decreased.  Objective:  Vital signs in last 24 hours:  Blood pressure (!) 134/55, pulse (!) 58, temperature 97.8 F (36.6 C), temperature source Oral, resp. rate 18, height 5' (1.524 m), weight 116 lb 12.8 oz (53 kg), SpO2 99 %.    HEENT: Oral cavity without visible mass, neck without mass Lymphatics: No cervical, supraclavicular, axillary, or inguinal nodes.  No palpable lymph node at the left cervical area Resp: Lungs clear bilaterally Cardio: Regular rate and rhythm GI: No mass, nontender, no hepatosplenomegaly Vascular: No leg edema  Lab Results:  Lab Results  Component Value Date   WBC 11.5 (H) 01/08/2019   HGB 11.6 (L) 01/08/2019   HCT 35.8 (L) 01/08/2019   MCV 88.8 01/08/2019   PLT 289 01/08/2019   NEUTROABS 5.6 12/25/2018    CMP  Lab Results  Component Value Date   NA 140 01/08/2019   K 3.6 01/08/2019   CL 103 01/08/2019   CO2 27 01/08/2019   GLUCOSE 113 (H) 01/08/2019   BUN 13 01/08/2019   CREATININE 0.50 01/08/2019   CALCIUM 9.0 01/08/2019   PROT 5.8 (L) 01/08/2019   ALBUMIN 2.9 (L) 01/08/2019   AST 20 01/08/2019   ALT 23 01/08/2019   ALKPHOS 60 01/08/2019   BILITOT 0.4 01/08/2019   GFRNONAA >60 01/08/2019   GFRAA >60 01/08/2019     Medications: I have reviewed the patient's current medications.   Assessment/Plan: 1. 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 Gastritis noted on endoscopy 11/04/2018, biopsy-chronic inactive gastritis, negative for Helicobacter 3. History of nausea and "heartburn"- reflux? 4. 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 Family history of multiple cancers 6. Weight loss following distal gastrectomy-nutrition referral made 07/21/2019, improved after beginning Reglan-Reglan discontinued secondary to tremors 7. COVID-04 April 2021     Disposition: Jane Howe remains in clinical remission from gastric cancer.  Her weight has stabilized.  She will return for an office visit in 6 months.  Betsy Coder, MD  05/08/2021  12:20 PM

## 2021-08-02 DIAGNOSIS — M3210 Systemic lupus erythematosus, organ or system involvement unspecified: Secondary | ICD-10-CM | POA: Diagnosis not present

## 2021-08-02 DIAGNOSIS — H5213 Myopia, bilateral: Secondary | ICD-10-CM | POA: Diagnosis not present

## 2021-08-02 DIAGNOSIS — H35363 Drusen (degenerative) of macula, bilateral: Secondary | ICD-10-CM | POA: Diagnosis not present

## 2021-08-02 DIAGNOSIS — H2513 Age-related nuclear cataract, bilateral: Secondary | ICD-10-CM | POA: Diagnosis not present

## 2021-08-02 DIAGNOSIS — H52223 Regular astigmatism, bilateral: Secondary | ICD-10-CM | POA: Diagnosis not present

## 2021-08-02 DIAGNOSIS — E7800 Pure hypercholesterolemia, unspecified: Secondary | ICD-10-CM | POA: Diagnosis not present

## 2021-08-02 DIAGNOSIS — H524 Presbyopia: Secondary | ICD-10-CM | POA: Diagnosis not present

## 2021-08-07 DIAGNOSIS — M549 Dorsalgia, unspecified: Secondary | ICD-10-CM | POA: Diagnosis not present

## 2021-08-07 DIAGNOSIS — R519 Headache, unspecified: Secondary | ICD-10-CM | POA: Diagnosis not present

## 2021-08-07 DIAGNOSIS — R079 Chest pain, unspecified: Secondary | ICD-10-CM | POA: Diagnosis not present

## 2021-09-11 DIAGNOSIS — M351 Other overlap syndromes: Secondary | ICD-10-CM | POA: Diagnosis not present

## 2021-09-11 DIAGNOSIS — I700 Atherosclerosis of aorta: Secondary | ICD-10-CM | POA: Diagnosis not present

## 2021-09-11 DIAGNOSIS — F3342 Major depressive disorder, recurrent, in full remission: Secondary | ICD-10-CM | POA: Diagnosis not present

## 2021-09-11 DIAGNOSIS — E782 Mixed hyperlipidemia: Secondary | ICD-10-CM | POA: Diagnosis not present

## 2021-09-11 DIAGNOSIS — Z1389 Encounter for screening for other disorder: Secondary | ICD-10-CM | POA: Diagnosis not present

## 2021-09-11 DIAGNOSIS — C169 Malignant neoplasm of stomach, unspecified: Secondary | ICD-10-CM | POA: Diagnosis not present

## 2021-09-11 DIAGNOSIS — R251 Tremor, unspecified: Secondary | ICD-10-CM | POA: Diagnosis not present

## 2021-09-11 DIAGNOSIS — F419 Anxiety disorder, unspecified: Secondary | ICD-10-CM | POA: Diagnosis not present

## 2021-09-11 DIAGNOSIS — Z23 Encounter for immunization: Secondary | ICD-10-CM | POA: Diagnosis not present

## 2021-09-11 DIAGNOSIS — G479 Sleep disorder, unspecified: Secondary | ICD-10-CM | POA: Diagnosis not present

## 2021-09-11 DIAGNOSIS — M8589 Other specified disorders of bone density and structure, multiple sites: Secondary | ICD-10-CM | POA: Diagnosis not present

## 2021-09-11 DIAGNOSIS — Z0000 Encounter for general adult medical examination without abnormal findings: Secondary | ICD-10-CM | POA: Diagnosis not present

## 2021-09-12 ENCOUNTER — Other Ambulatory Visit: Payer: Self-pay | Admitting: Family Medicine

## 2021-09-12 DIAGNOSIS — Z1231 Encounter for screening mammogram for malignant neoplasm of breast: Secondary | ICD-10-CM

## 2021-09-12 DIAGNOSIS — M8589 Other specified disorders of bone density and structure, multiple sites: Secondary | ICD-10-CM

## 2021-10-23 ENCOUNTER — Ambulatory Visit: Payer: Medicare Other

## 2021-10-24 DIAGNOSIS — S300XXA Contusion of lower back and pelvis, initial encounter: Secondary | ICD-10-CM | POA: Diagnosis not present

## 2021-11-13 ENCOUNTER — Inpatient Hospital Stay: Payer: Medicare Other | Attending: Oncology | Admitting: Oncology

## 2021-11-13 ENCOUNTER — Other Ambulatory Visit: Payer: Self-pay

## 2021-11-13 VITALS — BP 119/49 | HR 70 | Temp 98.1°F | Resp 20 | Ht 60.0 in | Wt 123.6 lb

## 2021-11-13 DIAGNOSIS — C162 Malignant neoplasm of body of stomach: Secondary | ICD-10-CM

## 2021-11-13 DIAGNOSIS — E782 Mixed hyperlipidemia: Secondary | ICD-10-CM | POA: Diagnosis not present

## 2021-11-13 NOTE — Progress Notes (Signed)
  [facility] OFFICE PROGRESS NOTE   Diagnosis: Gastric cancer  INTERVAL HISTORY:   Ms. Crume returns as scheduled.  She reports increased nausea and "dizziness "for the past few weeks.  She has difficulty with balance.  She had a good appetite up until the past few weeks.  Objective:  Vital signs in last 24 hours:  Blood pressure (!) 119/49, pulse 70, temperature 98.1 F (36.7 C), temperature source Oral, resp. rate 20, height 5' (1.524 m), weight 123 lb 9.6 oz (56.1 kg), SpO2 98 %.    Lymphatics: No cervical, supraclavicular, axillary, or inguinal nodes Resp: Lungs clear bilaterally Cardio: Regular rate and rhythm GI: No mass, nontender, no hepatosplenomegaly Vascular: No leg edema Neuro: Difficulty with heel-to-toe walking, gait unremarkable    Lab Results:  Lab Results  Component Value Date   WBC 11.5 (H) 01/08/2019   HGB 11.6 (L) 01/08/2019   HCT 35.8 (L) 01/08/2019   MCV 88.8 01/08/2019   PLT 289 01/08/2019   NEUTROABS 5.6 12/25/2018    CMP  Lab Results  Component Value Date   NA 140 01/08/2019   K 3.6 01/08/2019   CL 103 01/08/2019   CO2 27 01/08/2019   GLUCOSE 113 (H) 01/08/2019   BUN 13 01/08/2019   CREATININE 0.50 01/08/2019   CALCIUM 9.0 01/08/2019   PROT 5.8 (L) 01/08/2019   ALBUMIN 2.9 (L) 01/08/2019   AST 20 01/08/2019   ALT 23 01/08/2019   ALKPHOS 60 01/08/2019   BILITOT 0.4 01/08/2019   GFRNONAA >60 01/08/2019   GFRAA >60 01/08/2019    No results found for: CEA1, CEA, CAN199, CA125  Lab Results  Component Value Date   INR 0.96 12/25/2018   LABPROT 12.7 12/25/2018    Imaging:  No results found.  Medications: I have reviewed the patient's current medications.   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 CTs of the chest, abdomen, and pelvis on 11/17/2018 revealed no gastric mass and no  evidence of metastatic disease, tiny indeterminate lung nodules EUS on 11/27/2018- ulcer on the greater curvature of the gastric body measuring 6 mm, tattooed, T1 versus early T2, N0 Distal gastrectomy 01/01/2019- stage I (T1bN0 (poorly differentiated signet ring carcinoma, tumor invades submucosa, 11- lymph nodes, resection margins negative CTs 09/19/2019-distal gastrectomy, no evidence of metastatic disease, stable left base lung nodules-likely benign Gastritis noted on endoscopy 11/04/2018, biopsy-chronic inactive gastritis, negative for Helicobacter History of nausea and "heartburn"- reflux? Mixed connective tissue disease-previously treated with methotrexate and abatacept Family history of multiple cancers Weight loss following distal gastrectomy-nutrition referral made 07/21/2019, improved after beginning Reglan-Reglan discontinued secondary to tremors COVID-04 April 2021      Disposition: Jane Howe remains in clinical remission from gastric cancer.  We will follow-up on the endoscopy report by Dr. Michail Sermon from earlier this year.  She will return for an office visit in 6 months.  She will follow-up with Dr. Leonides Schanz if the nausea and balance difficulty persist.  We will request a vitamin B12 level be checked via Dr. Guido Sander office.  Ms. Ringle will return for an office visit in 6 months.  I have available to see her sooner as needed.  Betsy Coder, MD  11/13/2021  11:40 AM

## 2021-11-14 DIAGNOSIS — L700 Acne vulgaris: Secondary | ICD-10-CM | POA: Diagnosis not present

## 2021-11-14 DIAGNOSIS — L814 Other melanin hyperpigmentation: Secondary | ICD-10-CM | POA: Diagnosis not present

## 2021-11-14 DIAGNOSIS — D1801 Hemangioma of skin and subcutaneous tissue: Secondary | ICD-10-CM | POA: Diagnosis not present

## 2021-11-14 DIAGNOSIS — L821 Other seborrheic keratosis: Secondary | ICD-10-CM | POA: Diagnosis not present

## 2021-11-14 DIAGNOSIS — Z85828 Personal history of other malignant neoplasm of skin: Secondary | ICD-10-CM | POA: Diagnosis not present

## 2021-11-14 DIAGNOSIS — D225 Melanocytic nevi of trunk: Secondary | ICD-10-CM | POA: Diagnosis not present

## 2021-11-16 DIAGNOSIS — D649 Anemia, unspecified: Secondary | ICD-10-CM | POA: Diagnosis not present

## 2021-11-16 DIAGNOSIS — R079 Chest pain, unspecified: Secondary | ICD-10-CM | POA: Diagnosis not present

## 2021-11-16 DIAGNOSIS — F419 Anxiety disorder, unspecified: Secondary | ICD-10-CM | POA: Diagnosis not present

## 2021-11-16 DIAGNOSIS — R110 Nausea: Secondary | ICD-10-CM | POA: Diagnosis not present

## 2021-11-16 DIAGNOSIS — Z79899 Other long term (current) drug therapy: Secondary | ICD-10-CM | POA: Diagnosis not present

## 2021-11-17 ENCOUNTER — Other Ambulatory Visit: Payer: Self-pay | Admitting: *Deleted

## 2021-11-17 ENCOUNTER — Inpatient Hospital Stay: Payer: Medicare Other

## 2021-11-17 ENCOUNTER — Inpatient Hospital Stay: Payer: Medicare Other | Attending: Oncology | Admitting: Oncology

## 2021-11-17 ENCOUNTER — Other Ambulatory Visit: Payer: Self-pay

## 2021-11-17 VITALS — BP 111/51 | HR 67 | Temp 98.1°F | Resp 20 | Ht 60.0 in | Wt 124.0 lb

## 2021-11-17 DIAGNOSIS — D72820 Lymphocytosis (symptomatic): Secondary | ICD-10-CM | POA: Diagnosis not present

## 2021-11-17 DIAGNOSIS — R110 Nausea: Secondary | ICD-10-CM | POA: Diagnosis not present

## 2021-11-17 DIAGNOSIS — C169 Malignant neoplasm of stomach, unspecified: Secondary | ICD-10-CM | POA: Diagnosis not present

## 2021-11-17 DIAGNOSIS — G9340 Encephalopathy, unspecified: Secondary | ICD-10-CM | POA: Diagnosis not present

## 2021-11-17 DIAGNOSIS — C162 Malignant neoplasm of body of stomach: Secondary | ICD-10-CM

## 2021-11-17 DIAGNOSIS — R5383 Other fatigue: Secondary | ICD-10-CM | POA: Diagnosis not present

## 2021-11-17 DIAGNOSIS — E883 Tumor lysis syndrome: Secondary | ICD-10-CM | POA: Diagnosis not present

## 2021-11-17 DIAGNOSIS — Z9981 Dependence on supplemental oxygen: Secondary | ICD-10-CM | POA: Diagnosis not present

## 2021-11-17 DIAGNOSIS — D8489 Other immunodeficiencies: Secondary | ICD-10-CM | POA: Diagnosis not present

## 2021-11-17 DIAGNOSIS — Z66 Do not resuscitate: Secondary | ICD-10-CM | POA: Diagnosis not present

## 2021-11-17 DIAGNOSIS — Z8616 Personal history of COVID-19: Secondary | ICD-10-CM | POA: Diagnosis not present

## 2021-11-17 DIAGNOSIS — T451X5A Adverse effect of antineoplastic and immunosuppressive drugs, initial encounter: Secondary | ICD-10-CM | POA: Diagnosis not present

## 2021-11-17 DIAGNOSIS — R051 Acute cough: Secondary | ICD-10-CM | POA: Diagnosis not present

## 2021-11-17 DIAGNOSIS — D696 Thrombocytopenia, unspecified: Secondary | ICD-10-CM | POA: Diagnosis not present

## 2021-11-17 DIAGNOSIS — R5081 Fever presenting with conditions classified elsewhere: Secondary | ICD-10-CM | POA: Diagnosis not present

## 2021-11-17 DIAGNOSIS — R040 Epistaxis: Secondary | ICD-10-CM | POA: Diagnosis not present

## 2021-11-17 DIAGNOSIS — J90 Pleural effusion, not elsewhere classified: Secondary | ICD-10-CM | POA: Diagnosis not present

## 2021-11-17 DIAGNOSIS — R4182 Altered mental status, unspecified: Secondary | ICD-10-CM | POA: Diagnosis not present

## 2021-11-17 DIAGNOSIS — B338 Other specified viral diseases: Secondary | ICD-10-CM | POA: Diagnosis not present

## 2021-11-17 DIAGNOSIS — R791 Abnormal coagulation profile: Secondary | ICD-10-CM | POA: Insufficient documentation

## 2021-11-17 DIAGNOSIS — I4581 Long QT syndrome: Secondary | ICD-10-CM | POA: Diagnosis not present

## 2021-11-17 DIAGNOSIS — R509 Fever, unspecified: Secondary | ICD-10-CM | POA: Diagnosis not present

## 2021-11-17 DIAGNOSIS — I351 Nonrheumatic aortic (valve) insufficiency: Secondary | ICD-10-CM | POA: Diagnosis not present

## 2021-11-17 DIAGNOSIS — Z87891 Personal history of nicotine dependence: Secondary | ICD-10-CM | POA: Diagnosis not present

## 2021-11-17 DIAGNOSIS — K5900 Constipation, unspecified: Secondary | ICD-10-CM | POA: Diagnosis not present

## 2021-11-17 DIAGNOSIS — Z903 Acquired absence of stomach [part of]: Secondary | ICD-10-CM | POA: Diagnosis not present

## 2021-11-17 DIAGNOSIS — R600 Localized edema: Secondary | ICD-10-CM | POA: Diagnosis not present

## 2021-11-17 DIAGNOSIS — E8770 Fluid overload, unspecified: Secondary | ICD-10-CM | POA: Diagnosis not present

## 2021-11-17 DIAGNOSIS — G4700 Insomnia, unspecified: Secondary | ICD-10-CM | POA: Diagnosis not present

## 2021-11-17 DIAGNOSIS — R58 Hemorrhage, not elsewhere classified: Secondary | ICD-10-CM

## 2021-11-17 DIAGNOSIS — L988 Other specified disorders of the skin and subcutaneous tissue: Secondary | ICD-10-CM | POA: Diagnosis not present

## 2021-11-17 DIAGNOSIS — R059 Cough, unspecified: Secondary | ICD-10-CM | POA: Insufficient documentation

## 2021-11-17 DIAGNOSIS — F329 Major depressive disorder, single episode, unspecified: Secondary | ICD-10-CM | POA: Diagnosis not present

## 2021-11-17 DIAGNOSIS — J9601 Acute respiratory failure with hypoxia: Secondary | ICD-10-CM | POA: Diagnosis not present

## 2021-11-17 DIAGNOSIS — K719 Toxic liver disease, unspecified: Secondary | ICD-10-CM | POA: Diagnosis not present

## 2021-11-17 DIAGNOSIS — Z515 Encounter for palliative care: Secondary | ICD-10-CM | POA: Diagnosis not present

## 2021-11-17 DIAGNOSIS — R945 Abnormal results of liver function studies: Secondary | ICD-10-CM | POA: Diagnosis not present

## 2021-11-17 DIAGNOSIS — R570 Cardiogenic shock: Secondary | ICD-10-CM | POA: Diagnosis not present

## 2021-11-17 DIAGNOSIS — R079 Chest pain, unspecified: Secondary | ICD-10-CM | POA: Diagnosis not present

## 2021-11-17 DIAGNOSIS — C9200 Acute myeloblastic leukemia, not having achieved remission: Secondary | ICD-10-CM | POA: Diagnosis not present

## 2021-11-17 DIAGNOSIS — K219 Gastro-esophageal reflux disease without esophagitis: Secondary | ICD-10-CM | POA: Diagnosis not present

## 2021-11-17 DIAGNOSIS — R000 Tachycardia, unspecified: Secondary | ICD-10-CM | POA: Diagnosis not present

## 2021-11-17 DIAGNOSIS — C9300 Acute monoblastic/monocytic leukemia, not having achieved remission: Secondary | ICD-10-CM | POA: Diagnosis not present

## 2021-11-17 DIAGNOSIS — R410 Disorientation, unspecified: Secondary | ICD-10-CM | POA: Diagnosis not present

## 2021-11-17 DIAGNOSIS — I429 Cardiomyopathy, unspecified: Secondary | ICD-10-CM | POA: Diagnosis not present

## 2021-11-17 DIAGNOSIS — I517 Cardiomegaly: Secondary | ICD-10-CM | POA: Diagnosis not present

## 2021-11-17 DIAGNOSIS — Z5111 Encounter for antineoplastic chemotherapy: Secondary | ICD-10-CM | POA: Diagnosis not present

## 2021-11-17 DIAGNOSIS — R0902 Hypoxemia: Secondary | ICD-10-CM | POA: Diagnosis not present

## 2021-11-17 DIAGNOSIS — R0602 Shortness of breath: Secondary | ICD-10-CM | POA: Diagnosis not present

## 2021-11-17 DIAGNOSIS — Z85028 Personal history of other malignant neoplasm of stomach: Secondary | ICD-10-CM | POA: Diagnosis not present

## 2021-11-17 DIAGNOSIS — R0600 Dyspnea, unspecified: Secondary | ICD-10-CM | POA: Diagnosis not present

## 2021-11-17 DIAGNOSIS — D6949 Other primary thrombocytopenia: Secondary | ICD-10-CM | POA: Diagnosis not present

## 2021-11-17 DIAGNOSIS — J9811 Atelectasis: Secondary | ICD-10-CM | POA: Diagnosis not present

## 2021-11-17 DIAGNOSIS — D649 Anemia, unspecified: Secondary | ICD-10-CM | POA: Diagnosis not present

## 2021-11-17 DIAGNOSIS — E876 Hypokalemia: Secondary | ICD-10-CM | POA: Diagnosis not present

## 2021-11-17 DIAGNOSIS — A4901 Methicillin susceptible Staphylococcus aureus infection, unspecified site: Secondary | ICD-10-CM | POA: Diagnosis not present

## 2021-11-17 DIAGNOSIS — D61818 Other pancytopenia: Secondary | ICD-10-CM | POA: Diagnosis not present

## 2021-11-17 DIAGNOSIS — L270 Generalized skin eruption due to drugs and medicaments taken internally: Secondary | ICD-10-CM | POA: Diagnosis not present

## 2021-11-17 DIAGNOSIS — B974 Respiratory syncytial virus as the cause of diseases classified elsewhere: Secondary | ICD-10-CM | POA: Diagnosis not present

## 2021-11-17 DIAGNOSIS — C9290 Myeloid leukemia, unspecified, not having achieved remission: Secondary | ICD-10-CM | POA: Diagnosis not present

## 2021-11-17 DIAGNOSIS — I340 Nonrheumatic mitral (valve) insufficiency: Secondary | ICD-10-CM | POA: Diagnosis not present

## 2021-11-17 DIAGNOSIS — Z931 Gastrostomy status: Secondary | ICD-10-CM | POA: Diagnosis not present

## 2021-11-17 DIAGNOSIS — K1230 Oral mucositis (ulcerative), unspecified: Secondary | ICD-10-CM | POA: Diagnosis not present

## 2021-11-17 DIAGNOSIS — D709 Neutropenia, unspecified: Secondary | ICD-10-CM | POA: Diagnosis not present

## 2021-11-17 DIAGNOSIS — D61810 Antineoplastic chemotherapy induced pancytopenia: Secondary | ICD-10-CM | POA: Diagnosis not present

## 2021-11-17 DIAGNOSIS — Z960 Presence of urogenital implants: Secondary | ICD-10-CM | POA: Diagnosis not present

## 2021-11-17 DIAGNOSIS — R519 Headache, unspecified: Secondary | ICD-10-CM | POA: Diagnosis not present

## 2021-11-17 DIAGNOSIS — K521 Toxic gastroenteritis and colitis: Secondary | ICD-10-CM | POA: Diagnosis not present

## 2021-11-17 DIAGNOSIS — D708 Other neutropenia: Secondary | ICD-10-CM | POA: Diagnosis not present

## 2021-11-17 DIAGNOSIS — D849 Immunodeficiency, unspecified: Secondary | ICD-10-CM | POA: Diagnosis not present

## 2021-11-17 DIAGNOSIS — I5020 Unspecified systolic (congestive) heart failure: Secondary | ICD-10-CM | POA: Diagnosis not present

## 2021-11-17 DIAGNOSIS — R7881 Bacteremia: Secondary | ICD-10-CM | POA: Diagnosis not present

## 2021-11-17 DIAGNOSIS — R197 Diarrhea, unspecified: Secondary | ICD-10-CM | POA: Diagnosis not present

## 2021-11-17 DIAGNOSIS — A491 Streptococcal infection, unspecified site: Secondary | ICD-10-CM | POA: Diagnosis not present

## 2021-11-17 DIAGNOSIS — K121 Other forms of stomatitis: Secondary | ICD-10-CM | POA: Diagnosis not present

## 2021-11-17 DIAGNOSIS — C9590 Leukemia, unspecified not having achieved remission: Secondary | ICD-10-CM | POA: Diagnosis not present

## 2021-11-17 DIAGNOSIS — Z20822 Contact with and (suspected) exposure to covid-19: Secondary | ICD-10-CM | POA: Diagnosis not present

## 2021-11-17 DIAGNOSIS — R9431 Abnormal electrocardiogram [ECG] [EKG]: Secondary | ICD-10-CM | POA: Diagnosis not present

## 2021-11-17 DIAGNOSIS — T504X5A Adverse effect of drugs affecting uric acid metabolism, initial encounter: Secondary | ICD-10-CM | POA: Diagnosis not present

## 2021-11-17 DIAGNOSIS — J810 Acute pulmonary edema: Secondary | ICD-10-CM | POA: Diagnosis not present

## 2021-11-17 DIAGNOSIS — R21 Rash and other nonspecific skin eruption: Secondary | ICD-10-CM | POA: Diagnosis not present

## 2021-11-17 DIAGNOSIS — L308 Other specified dermatitis: Secondary | ICD-10-CM | POA: Diagnosis not present

## 2021-11-17 DIAGNOSIS — K1370 Unspecified lesions of oral mucosa: Secondary | ICD-10-CM | POA: Diagnosis not present

## 2021-11-17 LAB — PROTIME-INR
INR: 1.4 — ABNORMAL HIGH (ref 0.8–1.2)
Prothrombin Time: 16.8 seconds — ABNORMAL HIGH (ref 11.4–15.2)

## 2021-11-17 LAB — CBC WITH DIFFERENTIAL (CANCER CENTER ONLY)
Abs Immature Granulocytes: 144.29 10*3/uL — ABNORMAL HIGH (ref 0.00–0.07)
Basophils Absolute: 0 10*3/uL (ref 0.0–0.1)
Basophils Relative: 0 %
Blasts: 98 %
Eosinophils Absolute: 0 10*3/uL (ref 0.0–0.5)
Eosinophils Relative: 0 %
HCT: 29.5 % — ABNORMAL LOW (ref 36.0–46.0)
Hemoglobin: 9.4 g/dL — ABNORMAL LOW (ref 12.0–15.0)
Immature Granulocytes: 0 %
Lymphocytes Relative: 0 %
Lymphs Abs: 0 10*3/uL — ABNORMAL LOW (ref 0.7–4.0)
MCH: 31.8 pg (ref 26.0–34.0)
MCHC: 31.9 g/dL (ref 30.0–36.0)
MCV: 99.7 fL (ref 80.0–100.0)
Monocytes Absolute: 1.5 10*3/uL — ABNORMAL HIGH (ref 0.1–1.0)
Monocytes Relative: 1 %
Neutro Abs: 1.5 10*3/uL — ABNORMAL LOW (ref 1.7–7.7)
Neutrophils Relative %: 1 %
Platelet Count: 150 10*3/uL (ref 150–400)
RBC: 2.96 MIL/uL — ABNORMAL LOW (ref 3.87–5.11)
RDW: 15.1 % (ref 11.5–15.5)
WBC Count: 147.2 10*3/uL (ref 4.0–10.5)
nRBC: 0 % (ref 0.0–0.2)

## 2021-11-17 LAB — CMP (CANCER CENTER ONLY)
ALT: 17 U/L (ref 0–44)
AST: 35 U/L (ref 15–41)
Albumin: 3.6 g/dL (ref 3.5–5.0)
Alkaline Phosphatase: 84 U/L (ref 38–126)
Anion gap: 7 (ref 5–15)
BUN: 7 mg/dL — ABNORMAL LOW (ref 8–23)
CO2: 28 mmol/L (ref 22–32)
Calcium: 9.4 mg/dL (ref 8.9–10.3)
Chloride: 99 mmol/L (ref 98–111)
Creatinine: 0.72 mg/dL (ref 0.44–1.00)
GFR, Estimated: >60 mL/min (ref >60)
Glucose, Bld: 118 mg/dL — ABNORMAL HIGH (ref 70–99)
Potassium: 5.5 mmol/L — ABNORMAL HIGH (ref 3.5–5.1)
Sodium: 134 mmol/L — ABNORMAL LOW (ref 135–145)
Total Bilirubin: 0.4 mg/dL (ref 0.3–1.2)
Total Protein: 6.1 g/dL — ABNORMAL LOW (ref 6.5–8.1)

## 2021-11-17 LAB — DIC (DISSEMINATED INTRAVASCULAR COAGULATION)PANEL
D-Dimer, Quant: 5.54 ug/mL-FEU — ABNORMAL HIGH (ref 0.00–0.50)
Fibrinogen: 667 mg/dL — ABNORMAL HIGH (ref 210–475)
INR: 1.3 — ABNORMAL HIGH (ref 0.8–1.2)
Platelets: 150 10*3/uL (ref 150–400)
Prothrombin Time: 15.9 seconds — ABNORMAL HIGH (ref 11.4–15.2)
Smear Review: NONE SEEN
aPTT: 37 seconds — ABNORMAL HIGH (ref 24–36)

## 2021-11-17 LAB — LACTATE DEHYDROGENASE: LDH: 1351 U/L — ABNORMAL HIGH (ref 98–192)

## 2021-11-17 LAB — URIC ACID: Uric Acid, Serum: 7 mg/dL (ref 2.5–7.1)

## 2021-11-17 LAB — SAVE SMEAR(SSMR), FOR PROVIDER SLIDE REVIEW

## 2021-11-17 NOTE — Progress Notes (Signed)
CRITICAL VALUE STICKER  CRITICAL VALUE: WBC 147 w/blasts  RECEIVER (on-site recipient of call):Danique Hartsough,RN  DATE & TIME NOTIFIED: 11/17/21 @ 0928  MESSENGER (representative from lab):Katina  MD NOTIFIED: Dr. Benay Spice  TIME OF NOTIFICATION:0930  RESPONSE: Being seen in office w/contiued work-up. Looking at smear

## 2021-11-17 NOTE — Progress Notes (Signed)
[place] OFFICE PROGRESS NOTE   Diagnosis: Elevated white count, gastric cancer   INTERVAL HISTORY:   Ms. Jane Howe is a 75 year old woman with a history of gastric cancer diagnosed 2019.  Her PCP contacted the office yesterday afternoon to report CBC abnormalities.  She is seen today for further evaluation.  She reports a several week history of in general not feeling well.  She is fatigued.  Nausea but no vomiting.  She feels "clammy" with periodic sweats.  No known fever.  She denies bleeding.  No black stools.  Recent cough.  Mild shortness of breath.  Husband with a recent "cold" which she thinks she now has.  Increase in headaches.  No diplopia.  She feels "unsteady" when walking.  She fell a few weeks ago and "bruised" the coccyx.  She notes recent mid and low back pain.  No urinary symptoms.  Objective:  Vital signs in last 24 hours:  Blood pressure (!) 111/51, pulse 67, temperature 98.1 F (36.7 C), temperature source Oral, resp. rate 20, height 5' (1.524 m), weight 124 lb (56.2 kg), SpO2 98 %.    HEENT: Conjunctival pallor.  Buccal thrush. Lymphatics: No palpable cervical, supraclavicular, axillary or inguinal lymph nodes. Resp: Lungs clear bilaterally. Cardio: Regular rate and rhythm, 2/6 systolic murmur. GI: Abdomen soft, nontender.  Spleen palpable lateral left abdomen. Vascular: No leg edema. Neuro: Alert and oriented.  Follows commands. Skin: No rash.   Lab Results:  Lab Results  Component Value Date   WBC 147.2 (HH) 11/17/2021   HGB 9.4 (L) 11/17/2021   HCT 29.5 (L) 11/17/2021   MCV 99.7 11/17/2021   PLT 150 11/17/2021   NEUTROABS 3.5 11/17/2021  Peripheral blood smear-platelets mildly decreased; Red blood cells-polychromasia not increased, ovalocytes, few schistocytes; white blood cells-majority of white blood cells are large blasts with nuclei, some have cytoplasmic vacuoles and folded nuclei, few mature appearing neutrophils and bands, no Auer  rods.  Imaging:  No results found.  Medications: I have reviewed the patient's current medications.  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 CTs of the chest, abdomen, and pelvis on 11/17/2018 revealed no gastric mass and no evidence of metastatic disease, tiny indeterminate lung nodules EUS on 11/27/2018- ulcer on the greater curvature of the gastric body measuring 6 mm, tattooed, T1 versus early T2, N0 Distal gastrectomy 01/01/2019- stage I (T1bN0 (poorly differentiated signet ring carcinoma, tumor invades submucosa, 11- lymph nodes, resection margins negative CTs 09/19/2019-distal gastrectomy, no evidence of metastatic disease, stable left base lung nodules-likely benign Gastritis noted on endoscopy 11/04/2018, biopsy-chronic inactive gastritis, negative for Helicobacter History of nausea and "heartburn"- reflux? 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  Labs show a markedly elevated white count and LDH.  PT is mildly elevated.  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  We are contacting the leukemia service at [facility].  Patient seen with Dr. Benay Spice.  Ned Card ANP/GNP-BC   11/17/2021  10:29 AM  This was a shared visit with Ned Card.  Jane Howe was interviewed and examined.  I reviewed the peripheral blood smear.  I discussed the  case with Dr. Luan Pulling at [facility].  When I saw her earlier this week she complained of nausea and "dizziness ".  She had labs  obtained via Dr. Leonides Schanz yesterday and was noted to have marked lymphocytosis and anemia.  She now has sweats and a cough.  Review of the peripheral blood smear is consistent with a diagnosis of acute leukemia.  The blasts do not have Auer rods, but there are monocytic features.  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  I discussed the probable diagnosis with Jane Howe and her husband.  We discussed the need for specialized leukemia care.  I contacted Dr. Luan Pulling at [facility].  He agrees to accept her for hospital admission today.  I discussed the expected hospital stay with Ms. [name].  I am available to help with her care as needed.  I was present for greater than 50% of today's visit.  I performed medical decision making.  Julieanne Manson, MD

## 2021-11-17 NOTE — Progress Notes (Signed)
Dr. Benay Spice working patient in this morning. Orders entered.

## 2021-11-18 DIAGNOSIS — B338 Other specified viral diseases: Secondary | ICD-10-CM | POA: Diagnosis not present

## 2021-11-18 DIAGNOSIS — Z85028 Personal history of other malignant neoplasm of stomach: Secondary | ICD-10-CM | POA: Diagnosis not present

## 2021-11-18 DIAGNOSIS — D696 Thrombocytopenia, unspecified: Secondary | ICD-10-CM | POA: Diagnosis not present

## 2021-11-18 DIAGNOSIS — Z5111 Encounter for antineoplastic chemotherapy: Secondary | ICD-10-CM | POA: Diagnosis not present

## 2021-11-18 DIAGNOSIS — R110 Nausea: Secondary | ICD-10-CM | POA: Diagnosis not present

## 2021-11-18 DIAGNOSIS — D649 Anemia, unspecified: Secondary | ICD-10-CM | POA: Diagnosis not present

## 2021-11-18 DIAGNOSIS — R5383 Other fatigue: Secondary | ICD-10-CM | POA: Diagnosis not present

## 2021-11-18 DIAGNOSIS — C9200 Acute myeloblastic leukemia, not having achieved remission: Secondary | ICD-10-CM | POA: Diagnosis not present

## 2021-11-18 DIAGNOSIS — K219 Gastro-esophageal reflux disease without esophagitis: Secondary | ICD-10-CM | POA: Diagnosis not present

## 2021-11-18 DIAGNOSIS — R5081 Fever presenting with conditions classified elsewhere: Secondary | ICD-10-CM | POA: Diagnosis not present

## 2021-11-18 DIAGNOSIS — F329 Major depressive disorder, single episode, unspecified: Secondary | ICD-10-CM | POA: Diagnosis not present

## 2021-11-19 DIAGNOSIS — R509 Fever, unspecified: Secondary | ICD-10-CM | POA: Diagnosis not present

## 2021-11-19 DIAGNOSIS — F329 Major depressive disorder, single episode, unspecified: Secondary | ICD-10-CM | POA: Diagnosis not present

## 2021-11-19 DIAGNOSIS — C9200 Acute myeloblastic leukemia, not having achieved remission: Secondary | ICD-10-CM | POA: Diagnosis not present

## 2021-11-19 DIAGNOSIS — Z931 Gastrostomy status: Secondary | ICD-10-CM | POA: Diagnosis not present

## 2021-11-19 DIAGNOSIS — K219 Gastro-esophageal reflux disease without esophagitis: Secondary | ICD-10-CM | POA: Diagnosis not present

## 2021-11-19 DIAGNOSIS — D696 Thrombocytopenia, unspecified: Secondary | ICD-10-CM | POA: Diagnosis not present

## 2021-11-19 DIAGNOSIS — D849 Immunodeficiency, unspecified: Secondary | ICD-10-CM | POA: Diagnosis not present

## 2021-11-19 DIAGNOSIS — B974 Respiratory syncytial virus as the cause of diseases classified elsewhere: Secondary | ICD-10-CM | POA: Diagnosis not present

## 2021-11-19 DIAGNOSIS — R110 Nausea: Secondary | ICD-10-CM | POA: Diagnosis not present

## 2021-11-19 DIAGNOSIS — R519 Headache, unspecified: Secondary | ICD-10-CM | POA: Diagnosis not present

## 2021-11-19 DIAGNOSIS — R5383 Other fatigue: Secondary | ICD-10-CM | POA: Diagnosis not present

## 2021-11-19 DIAGNOSIS — R0602 Shortness of breath: Secondary | ICD-10-CM | POA: Diagnosis not present

## 2021-11-20 DIAGNOSIS — D649 Anemia, unspecified: Secondary | ICD-10-CM | POA: Diagnosis not present

## 2021-11-20 DIAGNOSIS — R5081 Fever presenting with conditions classified elsewhere: Secondary | ICD-10-CM | POA: Diagnosis not present

## 2021-11-20 DIAGNOSIS — C9590 Leukemia, unspecified not having achieved remission: Secondary | ICD-10-CM | POA: Diagnosis not present

## 2021-11-20 DIAGNOSIS — K219 Gastro-esophageal reflux disease without esophagitis: Secondary | ICD-10-CM | POA: Diagnosis not present

## 2021-11-20 DIAGNOSIS — Z85028 Personal history of other malignant neoplasm of stomach: Secondary | ICD-10-CM | POA: Diagnosis not present

## 2021-11-20 DIAGNOSIS — Z5111 Encounter for antineoplastic chemotherapy: Secondary | ICD-10-CM | POA: Diagnosis not present

## 2021-11-20 DIAGNOSIS — B338 Other specified viral diseases: Secondary | ICD-10-CM | POA: Diagnosis not present

## 2021-11-20 DIAGNOSIS — F329 Major depressive disorder, single episode, unspecified: Secondary | ICD-10-CM | POA: Diagnosis not present

## 2021-11-20 DIAGNOSIS — D696 Thrombocytopenia, unspecified: Secondary | ICD-10-CM | POA: Diagnosis not present

## 2021-11-20 DIAGNOSIS — R5383 Other fatigue: Secondary | ICD-10-CM | POA: Diagnosis not present

## 2021-11-20 DIAGNOSIS — R110 Nausea: Secondary | ICD-10-CM | POA: Diagnosis not present

## 2021-11-20 DIAGNOSIS — C9200 Acute myeloblastic leukemia, not having achieved remission: Secondary | ICD-10-CM | POA: Diagnosis not present

## 2021-11-21 DIAGNOSIS — R5383 Other fatigue: Secondary | ICD-10-CM | POA: Diagnosis not present

## 2021-11-21 DIAGNOSIS — R110 Nausea: Secondary | ICD-10-CM | POA: Diagnosis not present

## 2021-11-21 DIAGNOSIS — F329 Major depressive disorder, single episode, unspecified: Secondary | ICD-10-CM | POA: Diagnosis not present

## 2021-11-21 DIAGNOSIS — B974 Respiratory syncytial virus as the cause of diseases classified elsewhere: Secondary | ICD-10-CM | POA: Diagnosis not present

## 2021-11-21 DIAGNOSIS — R600 Localized edema: Secondary | ICD-10-CM | POA: Diagnosis not present

## 2021-11-21 DIAGNOSIS — Z5111 Encounter for antineoplastic chemotherapy: Secondary | ICD-10-CM | POA: Diagnosis not present

## 2021-11-21 DIAGNOSIS — C9590 Leukemia, unspecified not having achieved remission: Secondary | ICD-10-CM | POA: Diagnosis not present

## 2021-11-21 DIAGNOSIS — Z85028 Personal history of other malignant neoplasm of stomach: Secondary | ICD-10-CM | POA: Diagnosis not present

## 2021-11-21 DIAGNOSIS — D709 Neutropenia, unspecified: Secondary | ICD-10-CM | POA: Diagnosis not present

## 2021-11-21 DIAGNOSIS — R197 Diarrhea, unspecified: Secondary | ICD-10-CM | POA: Diagnosis not present

## 2021-11-21 DIAGNOSIS — K219 Gastro-esophageal reflux disease without esophagitis: Secondary | ICD-10-CM | POA: Diagnosis not present

## 2021-11-21 DIAGNOSIS — D649 Anemia, unspecified: Secondary | ICD-10-CM | POA: Diagnosis not present

## 2021-11-21 DIAGNOSIS — D696 Thrombocytopenia, unspecified: Secondary | ICD-10-CM | POA: Diagnosis not present

## 2021-11-21 DIAGNOSIS — B338 Other specified viral diseases: Secondary | ICD-10-CM | POA: Diagnosis not present

## 2021-11-21 DIAGNOSIS — R5081 Fever presenting with conditions classified elsewhere: Secondary | ICD-10-CM | POA: Diagnosis not present

## 2021-11-21 DIAGNOSIS — R051 Acute cough: Secondary | ICD-10-CM | POA: Diagnosis not present

## 2021-11-21 DIAGNOSIS — R509 Fever, unspecified: Secondary | ICD-10-CM | POA: Diagnosis not present

## 2021-11-21 DIAGNOSIS — C9200 Acute myeloblastic leukemia, not having achieved remission: Secondary | ICD-10-CM | POA: Diagnosis not present

## 2021-11-21 DIAGNOSIS — D849 Immunodeficiency, unspecified: Secondary | ICD-10-CM | POA: Diagnosis not present

## 2021-11-21 DIAGNOSIS — Z87891 Personal history of nicotine dependence: Secondary | ICD-10-CM | POA: Diagnosis not present

## 2021-11-21 LAB — METHYLMALONIC ACID, SERUM: Methylmalonic Acid, Quantitative: 215 nmol/L (ref 0–378)

## 2021-11-22 ENCOUNTER — Ambulatory Visit: Payer: Medicare Other

## 2021-11-22 DIAGNOSIS — C9590 Leukemia, unspecified not having achieved remission: Secondary | ICD-10-CM | POA: Diagnosis not present

## 2021-11-22 DIAGNOSIS — R110 Nausea: Secondary | ICD-10-CM | POA: Diagnosis not present

## 2021-11-22 DIAGNOSIS — R21 Rash and other nonspecific skin eruption: Secondary | ICD-10-CM | POA: Diagnosis not present

## 2021-11-22 DIAGNOSIS — D709 Neutropenia, unspecified: Secondary | ICD-10-CM | POA: Diagnosis not present

## 2021-11-22 DIAGNOSIS — C9200 Acute myeloblastic leukemia, not having achieved remission: Secondary | ICD-10-CM | POA: Diagnosis not present

## 2021-11-22 DIAGNOSIS — R197 Diarrhea, unspecified: Secondary | ICD-10-CM | POA: Diagnosis not present

## 2021-11-22 DIAGNOSIS — R5081 Fever presenting with conditions classified elsewhere: Secondary | ICD-10-CM | POA: Diagnosis not present

## 2021-11-22 DIAGNOSIS — B974 Respiratory syncytial virus as the cause of diseases classified elsewhere: Secondary | ICD-10-CM | POA: Diagnosis not present

## 2021-11-22 DIAGNOSIS — J90 Pleural effusion, not elsewhere classified: Secondary | ICD-10-CM | POA: Diagnosis not present

## 2021-11-22 DIAGNOSIS — E883 Tumor lysis syndrome: Secondary | ICD-10-CM | POA: Diagnosis not present

## 2021-11-22 DIAGNOSIS — K1370 Unspecified lesions of oral mucosa: Secondary | ICD-10-CM | POA: Diagnosis not present

## 2021-11-22 DIAGNOSIS — D696 Thrombocytopenia, unspecified: Secondary | ICD-10-CM | POA: Diagnosis not present

## 2021-11-22 DIAGNOSIS — R040 Epistaxis: Secondary | ICD-10-CM | POA: Diagnosis not present

## 2021-11-22 DIAGNOSIS — D61810 Antineoplastic chemotherapy induced pancytopenia: Secondary | ICD-10-CM | POA: Diagnosis not present

## 2021-11-23 ENCOUNTER — Telehealth: Payer: Self-pay | Admitting: *Deleted

## 2021-11-23 DIAGNOSIS — D849 Immunodeficiency, unspecified: Secondary | ICD-10-CM | POA: Diagnosis not present

## 2021-11-23 DIAGNOSIS — D61810 Antineoplastic chemotherapy induced pancytopenia: Secondary | ICD-10-CM | POA: Diagnosis not present

## 2021-11-23 DIAGNOSIS — D696 Thrombocytopenia, unspecified: Secondary | ICD-10-CM | POA: Diagnosis not present

## 2021-11-23 DIAGNOSIS — R197 Diarrhea, unspecified: Secondary | ICD-10-CM | POA: Diagnosis not present

## 2021-11-23 DIAGNOSIS — C9200 Acute myeloblastic leukemia, not having achieved remission: Secondary | ICD-10-CM | POA: Diagnosis not present

## 2021-11-23 DIAGNOSIS — K1370 Unspecified lesions of oral mucosa: Secondary | ICD-10-CM | POA: Diagnosis not present

## 2021-11-23 DIAGNOSIS — R5081 Fever presenting with conditions classified elsewhere: Secondary | ICD-10-CM | POA: Diagnosis not present

## 2021-11-23 DIAGNOSIS — E883 Tumor lysis syndrome: Secondary | ICD-10-CM | POA: Diagnosis not present

## 2021-11-23 DIAGNOSIS — R509 Fever, unspecified: Secondary | ICD-10-CM | POA: Diagnosis not present

## 2021-11-23 DIAGNOSIS — R040 Epistaxis: Secondary | ICD-10-CM | POA: Diagnosis not present

## 2021-11-23 DIAGNOSIS — R110 Nausea: Secondary | ICD-10-CM | POA: Diagnosis not present

## 2021-11-23 DIAGNOSIS — D709 Neutropenia, unspecified: Secondary | ICD-10-CM | POA: Diagnosis not present

## 2021-11-23 DIAGNOSIS — B974 Respiratory syncytial virus as the cause of diseases classified elsewhere: Secondary | ICD-10-CM | POA: Diagnosis not present

## 2021-11-23 DIAGNOSIS — R21 Rash and other nonspecific skin eruption: Secondary | ICD-10-CM | POA: Diagnosis not present

## 2021-11-23 NOTE — Telephone Encounter (Signed)
Left VM w/husband to let Jane Howe know that we are following her care at [facility] and wish her the best. When d/c is coming up, [facility] will reach out to [place] to coordinate her local care/labs.

## 2021-11-24 DIAGNOSIS — R197 Diarrhea, unspecified: Secondary | ICD-10-CM | POA: Diagnosis not present

## 2021-11-24 DIAGNOSIS — D61810 Antineoplastic chemotherapy induced pancytopenia: Secondary | ICD-10-CM | POA: Diagnosis not present

## 2021-11-24 DIAGNOSIS — J90 Pleural effusion, not elsewhere classified: Secondary | ICD-10-CM | POA: Diagnosis not present

## 2021-11-24 DIAGNOSIS — C9290 Myeloid leukemia, unspecified, not having achieved remission: Secondary | ICD-10-CM | POA: Diagnosis not present

## 2021-11-24 DIAGNOSIS — R509 Fever, unspecified: Secondary | ICD-10-CM | POA: Diagnosis not present

## 2021-11-24 DIAGNOSIS — K1370 Unspecified lesions of oral mucosa: Secondary | ICD-10-CM | POA: Diagnosis not present

## 2021-11-24 DIAGNOSIS — C9200 Acute myeloblastic leukemia, not having achieved remission: Secondary | ICD-10-CM | POA: Diagnosis not present

## 2021-11-24 DIAGNOSIS — D709 Neutropenia, unspecified: Secondary | ICD-10-CM | POA: Diagnosis not present

## 2021-11-24 DIAGNOSIS — R110 Nausea: Secondary | ICD-10-CM | POA: Diagnosis not present

## 2021-11-24 DIAGNOSIS — B974 Respiratory syncytial virus as the cause of diseases classified elsewhere: Secondary | ICD-10-CM | POA: Diagnosis not present

## 2021-11-24 DIAGNOSIS — D696 Thrombocytopenia, unspecified: Secondary | ICD-10-CM | POA: Diagnosis not present

## 2021-11-24 DIAGNOSIS — R5081 Fever presenting with conditions classified elsewhere: Secondary | ICD-10-CM | POA: Diagnosis not present

## 2021-11-24 DIAGNOSIS — E883 Tumor lysis syndrome: Secondary | ICD-10-CM | POA: Diagnosis not present

## 2021-11-24 DIAGNOSIS — D849 Immunodeficiency, unspecified: Secondary | ICD-10-CM | POA: Diagnosis not present

## 2021-11-24 DIAGNOSIS — R21 Rash and other nonspecific skin eruption: Secondary | ICD-10-CM | POA: Diagnosis not present

## 2021-11-24 DIAGNOSIS — R040 Epistaxis: Secondary | ICD-10-CM | POA: Diagnosis not present

## 2021-11-24 LAB — PATHOLOGIST SMEAR REVIEW

## 2021-11-25 DIAGNOSIS — R509 Fever, unspecified: Secondary | ICD-10-CM | POA: Diagnosis not present

## 2021-11-25 DIAGNOSIS — R21 Rash and other nonspecific skin eruption: Secondary | ICD-10-CM | POA: Diagnosis not present

## 2021-11-25 DIAGNOSIS — B974 Respiratory syncytial virus as the cause of diseases classified elsewhere: Secondary | ICD-10-CM | POA: Diagnosis not present

## 2021-11-25 DIAGNOSIS — D849 Immunodeficiency, unspecified: Secondary | ICD-10-CM | POA: Diagnosis not present

## 2021-11-25 DIAGNOSIS — C9200 Acute myeloblastic leukemia, not having achieved remission: Secondary | ICD-10-CM | POA: Diagnosis not present

## 2021-11-25 DIAGNOSIS — E883 Tumor lysis syndrome: Secondary | ICD-10-CM | POA: Diagnosis not present

## 2021-11-25 DIAGNOSIS — D709 Neutropenia, unspecified: Secondary | ICD-10-CM | POA: Diagnosis not present

## 2021-11-25 DIAGNOSIS — D61810 Antineoplastic chemotherapy induced pancytopenia: Secondary | ICD-10-CM | POA: Diagnosis not present

## 2021-11-25 DIAGNOSIS — K1370 Unspecified lesions of oral mucosa: Secondary | ICD-10-CM | POA: Diagnosis not present

## 2021-11-25 DIAGNOSIS — R5081 Fever presenting with conditions classified elsewhere: Secondary | ICD-10-CM | POA: Diagnosis not present

## 2021-11-25 DIAGNOSIS — D696 Thrombocytopenia, unspecified: Secondary | ICD-10-CM | POA: Diagnosis not present

## 2021-11-25 DIAGNOSIS — R197 Diarrhea, unspecified: Secondary | ICD-10-CM | POA: Diagnosis not present

## 2021-11-25 DIAGNOSIS — Z9981 Dependence on supplemental oxygen: Secondary | ICD-10-CM | POA: Diagnosis not present

## 2021-11-25 DIAGNOSIS — R040 Epistaxis: Secondary | ICD-10-CM | POA: Diagnosis not present

## 2021-11-25 DIAGNOSIS — R110 Nausea: Secondary | ICD-10-CM | POA: Diagnosis not present

## 2021-11-26 DIAGNOSIS — R509 Fever, unspecified: Secondary | ICD-10-CM | POA: Diagnosis not present

## 2021-11-26 DIAGNOSIS — C9200 Acute myeloblastic leukemia, not having achieved remission: Secondary | ICD-10-CM | POA: Diagnosis not present

## 2021-11-26 DIAGNOSIS — R110 Nausea: Secondary | ICD-10-CM | POA: Diagnosis not present

## 2021-11-26 DIAGNOSIS — E883 Tumor lysis syndrome: Secondary | ICD-10-CM | POA: Diagnosis not present

## 2021-11-26 DIAGNOSIS — B974 Respiratory syncytial virus as the cause of diseases classified elsewhere: Secondary | ICD-10-CM | POA: Diagnosis not present

## 2021-11-26 DIAGNOSIS — D709 Neutropenia, unspecified: Secondary | ICD-10-CM | POA: Diagnosis not present

## 2021-11-26 DIAGNOSIS — R040 Epistaxis: Secondary | ICD-10-CM | POA: Diagnosis not present

## 2021-11-26 DIAGNOSIS — D696 Thrombocytopenia, unspecified: Secondary | ICD-10-CM | POA: Diagnosis not present

## 2021-11-26 DIAGNOSIS — D61810 Antineoplastic chemotherapy induced pancytopenia: Secondary | ICD-10-CM | POA: Diagnosis not present

## 2021-11-26 DIAGNOSIS — Z9981 Dependence on supplemental oxygen: Secondary | ICD-10-CM | POA: Diagnosis not present

## 2021-11-26 DIAGNOSIS — K1370 Unspecified lesions of oral mucosa: Secondary | ICD-10-CM | POA: Diagnosis not present

## 2021-11-26 DIAGNOSIS — R21 Rash and other nonspecific skin eruption: Secondary | ICD-10-CM | POA: Diagnosis not present

## 2021-11-26 DIAGNOSIS — R5081 Fever presenting with conditions classified elsewhere: Secondary | ICD-10-CM | POA: Diagnosis not present

## 2021-11-26 DIAGNOSIS — R197 Diarrhea, unspecified: Secondary | ICD-10-CM | POA: Diagnosis not present

## 2021-11-26 DIAGNOSIS — D849 Immunodeficiency, unspecified: Secondary | ICD-10-CM | POA: Diagnosis not present

## 2021-11-27 DIAGNOSIS — R5081 Fever presenting with conditions classified elsewhere: Secondary | ICD-10-CM | POA: Diagnosis not present

## 2021-11-27 DIAGNOSIS — R040 Epistaxis: Secondary | ICD-10-CM | POA: Diagnosis not present

## 2021-11-27 DIAGNOSIS — K1370 Unspecified lesions of oral mucosa: Secondary | ICD-10-CM | POA: Diagnosis not present

## 2021-11-27 DIAGNOSIS — R110 Nausea: Secondary | ICD-10-CM | POA: Diagnosis not present

## 2021-11-27 DIAGNOSIS — D709 Neutropenia, unspecified: Secondary | ICD-10-CM | POA: Diagnosis not present

## 2021-11-27 DIAGNOSIS — R21 Rash and other nonspecific skin eruption: Secondary | ICD-10-CM | POA: Diagnosis not present

## 2021-11-27 DIAGNOSIS — E883 Tumor lysis syndrome: Secondary | ICD-10-CM | POA: Diagnosis not present

## 2021-11-27 DIAGNOSIS — L988 Other specified disorders of the skin and subcutaneous tissue: Secondary | ICD-10-CM | POA: Diagnosis not present

## 2021-11-27 DIAGNOSIS — B974 Respiratory syncytial virus as the cause of diseases classified elsewhere: Secondary | ICD-10-CM | POA: Diagnosis not present

## 2021-11-27 DIAGNOSIS — D61810 Antineoplastic chemotherapy induced pancytopenia: Secondary | ICD-10-CM | POA: Diagnosis not present

## 2021-11-27 DIAGNOSIS — R197 Diarrhea, unspecified: Secondary | ICD-10-CM | POA: Diagnosis not present

## 2021-11-27 DIAGNOSIS — D696 Thrombocytopenia, unspecified: Secondary | ICD-10-CM | POA: Diagnosis not present

## 2021-11-27 DIAGNOSIS — D8489 Other immunodeficiencies: Secondary | ICD-10-CM | POA: Diagnosis not present

## 2021-11-27 DIAGNOSIS — C9200 Acute myeloblastic leukemia, not having achieved remission: Secondary | ICD-10-CM | POA: Diagnosis not present

## 2021-11-28 DIAGNOSIS — D709 Neutropenia, unspecified: Secondary | ICD-10-CM | POA: Diagnosis not present

## 2021-11-28 DIAGNOSIS — R21 Rash and other nonspecific skin eruption: Secondary | ICD-10-CM | POA: Diagnosis not present

## 2021-11-28 DIAGNOSIS — R5081 Fever presenting with conditions classified elsewhere: Secondary | ICD-10-CM | POA: Diagnosis not present

## 2021-11-28 DIAGNOSIS — C9200 Acute myeloblastic leukemia, not having achieved remission: Secondary | ICD-10-CM | POA: Diagnosis not present

## 2021-11-28 DIAGNOSIS — K1370 Unspecified lesions of oral mucosa: Secondary | ICD-10-CM | POA: Diagnosis not present

## 2021-11-28 DIAGNOSIS — D8489 Other immunodeficiencies: Secondary | ICD-10-CM | POA: Diagnosis not present

## 2021-11-28 DIAGNOSIS — D696 Thrombocytopenia, unspecified: Secondary | ICD-10-CM | POA: Diagnosis not present

## 2021-11-28 DIAGNOSIS — R197 Diarrhea, unspecified: Secondary | ICD-10-CM | POA: Diagnosis not present

## 2021-11-28 DIAGNOSIS — E883 Tumor lysis syndrome: Secondary | ICD-10-CM | POA: Diagnosis not present

## 2021-11-28 DIAGNOSIS — R040 Epistaxis: Secondary | ICD-10-CM | POA: Diagnosis not present

## 2021-11-28 DIAGNOSIS — D61810 Antineoplastic chemotherapy induced pancytopenia: Secondary | ICD-10-CM | POA: Diagnosis not present

## 2021-11-28 DIAGNOSIS — R110 Nausea: Secondary | ICD-10-CM | POA: Diagnosis not present

## 2021-11-28 DIAGNOSIS — B974 Respiratory syncytial virus as the cause of diseases classified elsewhere: Secondary | ICD-10-CM | POA: Diagnosis not present

## 2021-11-29 DIAGNOSIS — D61810 Antineoplastic chemotherapy induced pancytopenia: Secondary | ICD-10-CM | POA: Diagnosis not present

## 2021-11-29 DIAGNOSIS — K1230 Oral mucositis (ulcerative), unspecified: Secondary | ICD-10-CM | POA: Diagnosis not present

## 2021-11-29 DIAGNOSIS — D8489 Other immunodeficiencies: Secondary | ICD-10-CM | POA: Diagnosis not present

## 2021-11-29 DIAGNOSIS — B974 Respiratory syncytial virus as the cause of diseases classified elsewhere: Secondary | ICD-10-CM | POA: Diagnosis not present

## 2021-11-29 DIAGNOSIS — R040 Epistaxis: Secondary | ICD-10-CM | POA: Diagnosis not present

## 2021-11-29 DIAGNOSIS — R197 Diarrhea, unspecified: Secondary | ICD-10-CM | POA: Diagnosis not present

## 2021-11-29 DIAGNOSIS — D709 Neutropenia, unspecified: Secondary | ICD-10-CM | POA: Diagnosis not present

## 2021-11-29 DIAGNOSIS — R21 Rash and other nonspecific skin eruption: Secondary | ICD-10-CM | POA: Diagnosis not present

## 2021-11-29 DIAGNOSIS — E8770 Fluid overload, unspecified: Secondary | ICD-10-CM | POA: Diagnosis not present

## 2021-11-29 DIAGNOSIS — C9200 Acute myeloblastic leukemia, not having achieved remission: Secondary | ICD-10-CM | POA: Diagnosis not present

## 2021-11-29 DIAGNOSIS — R5081 Fever presenting with conditions classified elsewhere: Secondary | ICD-10-CM | POA: Diagnosis not present

## 2021-11-29 DIAGNOSIS — R9431 Abnormal electrocardiogram [ECG] [EKG]: Secondary | ICD-10-CM | POA: Diagnosis not present

## 2021-11-30 DIAGNOSIS — C9200 Acute myeloblastic leukemia, not having achieved remission: Secondary | ICD-10-CM | POA: Diagnosis not present

## 2021-11-30 DIAGNOSIS — R21 Rash and other nonspecific skin eruption: Secondary | ICD-10-CM | POA: Diagnosis not present

## 2021-11-30 DIAGNOSIS — R197 Diarrhea, unspecified: Secondary | ICD-10-CM | POA: Diagnosis not present

## 2021-11-30 DIAGNOSIS — R040 Epistaxis: Secondary | ICD-10-CM | POA: Diagnosis not present

## 2021-11-30 DIAGNOSIS — R079 Chest pain, unspecified: Secondary | ICD-10-CM | POA: Diagnosis not present

## 2021-11-30 DIAGNOSIS — D61810 Antineoplastic chemotherapy induced pancytopenia: Secondary | ICD-10-CM | POA: Diagnosis not present

## 2021-11-30 DIAGNOSIS — K1230 Oral mucositis (ulcerative), unspecified: Secondary | ICD-10-CM | POA: Diagnosis not present

## 2021-11-30 DIAGNOSIS — E8770 Fluid overload, unspecified: Secondary | ICD-10-CM | POA: Diagnosis not present

## 2021-11-30 DIAGNOSIS — D709 Neutropenia, unspecified: Secondary | ICD-10-CM | POA: Diagnosis not present

## 2021-11-30 DIAGNOSIS — R5081 Fever presenting with conditions classified elsewhere: Secondary | ICD-10-CM | POA: Diagnosis not present

## 2021-12-01 DIAGNOSIS — R5081 Fever presenting with conditions classified elsewhere: Secondary | ICD-10-CM | POA: Diagnosis not present

## 2021-12-01 DIAGNOSIS — C9200 Acute myeloblastic leukemia, not having achieved remission: Secondary | ICD-10-CM | POA: Diagnosis not present

## 2021-12-01 DIAGNOSIS — D709 Neutropenia, unspecified: Secondary | ICD-10-CM | POA: Diagnosis not present

## 2021-12-01 DIAGNOSIS — D61810 Antineoplastic chemotherapy induced pancytopenia: Secondary | ICD-10-CM | POA: Diagnosis not present

## 2021-12-01 DIAGNOSIS — R040 Epistaxis: Secondary | ICD-10-CM | POA: Diagnosis not present

## 2021-12-01 DIAGNOSIS — E8770 Fluid overload, unspecified: Secondary | ICD-10-CM | POA: Diagnosis not present

## 2021-12-01 DIAGNOSIS — R197 Diarrhea, unspecified: Secondary | ICD-10-CM | POA: Diagnosis not present

## 2021-12-01 DIAGNOSIS — K1230 Oral mucositis (ulcerative), unspecified: Secondary | ICD-10-CM | POA: Diagnosis not present

## 2021-12-01 DIAGNOSIS — R21 Rash and other nonspecific skin eruption: Secondary | ICD-10-CM | POA: Diagnosis not present

## 2021-12-02 DIAGNOSIS — E8770 Fluid overload, unspecified: Secondary | ICD-10-CM | POA: Diagnosis not present

## 2021-12-02 DIAGNOSIS — D709 Neutropenia, unspecified: Secondary | ICD-10-CM | POA: Diagnosis not present

## 2021-12-02 DIAGNOSIS — D61810 Antineoplastic chemotherapy induced pancytopenia: Secondary | ICD-10-CM | POA: Diagnosis not present

## 2021-12-02 DIAGNOSIS — K1230 Oral mucositis (ulcerative), unspecified: Secondary | ICD-10-CM | POA: Diagnosis not present

## 2021-12-02 DIAGNOSIS — C9200 Acute myeloblastic leukemia, not having achieved remission: Secondary | ICD-10-CM | POA: Diagnosis not present

## 2021-12-02 DIAGNOSIS — R197 Diarrhea, unspecified: Secondary | ICD-10-CM | POA: Diagnosis not present

## 2021-12-02 DIAGNOSIS — R040 Epistaxis: Secondary | ICD-10-CM | POA: Diagnosis not present

## 2021-12-02 DIAGNOSIS — R5081 Fever presenting with conditions classified elsewhere: Secondary | ICD-10-CM | POA: Diagnosis not present

## 2021-12-02 DIAGNOSIS — R21 Rash and other nonspecific skin eruption: Secondary | ICD-10-CM | POA: Diagnosis not present

## 2021-12-03 DIAGNOSIS — R5081 Fever presenting with conditions classified elsewhere: Secondary | ICD-10-CM | POA: Diagnosis not present

## 2021-12-03 DIAGNOSIS — D61810 Antineoplastic chemotherapy induced pancytopenia: Secondary | ICD-10-CM | POA: Diagnosis not present

## 2021-12-03 DIAGNOSIS — E8770 Fluid overload, unspecified: Secondary | ICD-10-CM | POA: Diagnosis not present

## 2021-12-03 DIAGNOSIS — C9200 Acute myeloblastic leukemia, not having achieved remission: Secondary | ICD-10-CM | POA: Diagnosis not present

## 2021-12-03 DIAGNOSIS — R197 Diarrhea, unspecified: Secondary | ICD-10-CM | POA: Diagnosis not present

## 2021-12-03 DIAGNOSIS — D709 Neutropenia, unspecified: Secondary | ICD-10-CM | POA: Diagnosis not present

## 2021-12-03 DIAGNOSIS — R21 Rash and other nonspecific skin eruption: Secondary | ICD-10-CM | POA: Diagnosis not present

## 2021-12-03 DIAGNOSIS — K1230 Oral mucositis (ulcerative), unspecified: Secondary | ICD-10-CM | POA: Diagnosis not present

## 2021-12-03 DIAGNOSIS — R040 Epistaxis: Secondary | ICD-10-CM | POA: Diagnosis not present

## 2021-12-04 DIAGNOSIS — G4700 Insomnia, unspecified: Secondary | ICD-10-CM | POA: Diagnosis not present

## 2021-12-04 DIAGNOSIS — R21 Rash and other nonspecific skin eruption: Secondary | ICD-10-CM | POA: Diagnosis not present

## 2021-12-04 DIAGNOSIS — K1230 Oral mucositis (ulcerative), unspecified: Secondary | ICD-10-CM | POA: Diagnosis not present

## 2021-12-04 DIAGNOSIS — C9200 Acute myeloblastic leukemia, not having achieved remission: Secondary | ICD-10-CM | POA: Diagnosis not present

## 2021-12-04 DIAGNOSIS — D709 Neutropenia, unspecified: Secondary | ICD-10-CM | POA: Diagnosis not present

## 2021-12-04 DIAGNOSIS — E8770 Fluid overload, unspecified: Secondary | ICD-10-CM | POA: Diagnosis not present

## 2021-12-04 DIAGNOSIS — R5081 Fever presenting with conditions classified elsewhere: Secondary | ICD-10-CM | POA: Diagnosis not present

## 2021-12-04 DIAGNOSIS — R197 Diarrhea, unspecified: Secondary | ICD-10-CM | POA: Diagnosis not present

## 2021-12-04 DIAGNOSIS — D61810 Antineoplastic chemotherapy induced pancytopenia: Secondary | ICD-10-CM | POA: Diagnosis not present

## 2021-12-05 DIAGNOSIS — G4700 Insomnia, unspecified: Secondary | ICD-10-CM | POA: Diagnosis not present

## 2021-12-05 DIAGNOSIS — Z5111 Encounter for antineoplastic chemotherapy: Secondary | ICD-10-CM | POA: Diagnosis not present

## 2021-12-05 DIAGNOSIS — E8770 Fluid overload, unspecified: Secondary | ICD-10-CM | POA: Diagnosis not present

## 2021-12-05 DIAGNOSIS — R5081 Fever presenting with conditions classified elsewhere: Secondary | ICD-10-CM | POA: Diagnosis not present

## 2021-12-05 DIAGNOSIS — D708 Other neutropenia: Secondary | ICD-10-CM | POA: Diagnosis not present

## 2021-12-05 DIAGNOSIS — D61810 Antineoplastic chemotherapy induced pancytopenia: Secondary | ICD-10-CM | POA: Diagnosis not present

## 2021-12-05 DIAGNOSIS — R0902 Hypoxemia: Secondary | ICD-10-CM | POA: Diagnosis not present

## 2021-12-05 DIAGNOSIS — J90 Pleural effusion, not elsewhere classified: Secondary | ICD-10-CM | POA: Diagnosis not present

## 2021-12-05 DIAGNOSIS — L308 Other specified dermatitis: Secondary | ICD-10-CM | POA: Diagnosis not present

## 2021-12-05 DIAGNOSIS — R197 Diarrhea, unspecified: Secondary | ICD-10-CM | POA: Diagnosis not present

## 2021-12-05 DIAGNOSIS — C9200 Acute myeloblastic leukemia, not having achieved remission: Secondary | ICD-10-CM | POA: Diagnosis not present

## 2021-12-05 DIAGNOSIS — K1230 Oral mucositis (ulcerative), unspecified: Secondary | ICD-10-CM | POA: Diagnosis not present

## 2021-12-05 DIAGNOSIS — R040 Epistaxis: Secondary | ICD-10-CM | POA: Diagnosis not present

## 2021-12-06 DIAGNOSIS — L308 Other specified dermatitis: Secondary | ICD-10-CM | POA: Diagnosis not present

## 2021-12-06 DIAGNOSIS — K1230 Oral mucositis (ulcerative), unspecified: Secondary | ICD-10-CM | POA: Diagnosis not present

## 2021-12-06 DIAGNOSIS — R21 Rash and other nonspecific skin eruption: Secondary | ICD-10-CM | POA: Diagnosis not present

## 2021-12-06 DIAGNOSIS — R5081 Fever presenting with conditions classified elsewhere: Secondary | ICD-10-CM | POA: Diagnosis not present

## 2021-12-06 DIAGNOSIS — G4700 Insomnia, unspecified: Secondary | ICD-10-CM | POA: Diagnosis not present

## 2021-12-06 DIAGNOSIS — D61810 Antineoplastic chemotherapy induced pancytopenia: Secondary | ICD-10-CM | POA: Diagnosis not present

## 2021-12-06 DIAGNOSIS — R040 Epistaxis: Secondary | ICD-10-CM | POA: Diagnosis not present

## 2021-12-06 DIAGNOSIS — D708 Other neutropenia: Secondary | ICD-10-CM | POA: Diagnosis not present

## 2021-12-06 DIAGNOSIS — Z5111 Encounter for antineoplastic chemotherapy: Secondary | ICD-10-CM | POA: Diagnosis not present

## 2021-12-06 DIAGNOSIS — C9200 Acute myeloblastic leukemia, not having achieved remission: Secondary | ICD-10-CM | POA: Diagnosis not present

## 2021-12-06 DIAGNOSIS — E8770 Fluid overload, unspecified: Secondary | ICD-10-CM | POA: Diagnosis not present

## 2021-12-06 DIAGNOSIS — R197 Diarrhea, unspecified: Secondary | ICD-10-CM | POA: Diagnosis not present

## 2021-12-07 DIAGNOSIS — D708 Other neutropenia: Secondary | ICD-10-CM | POA: Diagnosis not present

## 2021-12-07 DIAGNOSIS — C9590 Leukemia, unspecified not having achieved remission: Secondary | ICD-10-CM | POA: Diagnosis not present

## 2021-12-07 DIAGNOSIS — R21 Rash and other nonspecific skin eruption: Secondary | ICD-10-CM | POA: Diagnosis not present

## 2021-12-07 DIAGNOSIS — R9431 Abnormal electrocardiogram [ECG] [EKG]: Secondary | ICD-10-CM | POA: Diagnosis not present

## 2021-12-07 DIAGNOSIS — Z9981 Dependence on supplemental oxygen: Secondary | ICD-10-CM | POA: Diagnosis not present

## 2021-12-07 DIAGNOSIS — G4700 Insomnia, unspecified: Secondary | ICD-10-CM | POA: Diagnosis not present

## 2021-12-07 DIAGNOSIS — K1230 Oral mucositis (ulcerative), unspecified: Secondary | ICD-10-CM | POA: Diagnosis not present

## 2021-12-07 DIAGNOSIS — D61810 Antineoplastic chemotherapy induced pancytopenia: Secondary | ICD-10-CM | POA: Diagnosis not present

## 2021-12-07 DIAGNOSIS — E8770 Fluid overload, unspecified: Secondary | ICD-10-CM | POA: Diagnosis not present

## 2021-12-07 DIAGNOSIS — R197 Diarrhea, unspecified: Secondary | ICD-10-CM | POA: Diagnosis not present

## 2021-12-07 DIAGNOSIS — Z5111 Encounter for antineoplastic chemotherapy: Secondary | ICD-10-CM | POA: Diagnosis not present

## 2021-12-07 DIAGNOSIS — J9601 Acute respiratory failure with hypoxia: Secondary | ICD-10-CM | POA: Diagnosis not present

## 2021-12-07 DIAGNOSIS — L308 Other specified dermatitis: Secondary | ICD-10-CM | POA: Diagnosis not present

## 2021-12-07 DIAGNOSIS — C9200 Acute myeloblastic leukemia, not having achieved remission: Secondary | ICD-10-CM | POA: Diagnosis not present

## 2021-12-08 DIAGNOSIS — D61810 Antineoplastic chemotherapy induced pancytopenia: Secondary | ICD-10-CM | POA: Diagnosis not present

## 2021-12-08 DIAGNOSIS — R197 Diarrhea, unspecified: Secondary | ICD-10-CM | POA: Diagnosis not present

## 2021-12-08 DIAGNOSIS — K1230 Oral mucositis (ulcerative), unspecified: Secondary | ICD-10-CM | POA: Diagnosis not present

## 2021-12-08 DIAGNOSIS — I4581 Long QT syndrome: Secondary | ICD-10-CM | POA: Diagnosis not present

## 2021-12-08 DIAGNOSIS — J9811 Atelectasis: Secondary | ICD-10-CM | POA: Diagnosis not present

## 2021-12-08 DIAGNOSIS — C9200 Acute myeloblastic leukemia, not having achieved remission: Secondary | ICD-10-CM | POA: Diagnosis not present

## 2021-12-08 DIAGNOSIS — R9431 Abnormal electrocardiogram [ECG] [EKG]: Secondary | ICD-10-CM | POA: Diagnosis not present

## 2021-12-08 DIAGNOSIS — D6949 Other primary thrombocytopenia: Secondary | ICD-10-CM | POA: Diagnosis not present

## 2021-12-08 DIAGNOSIS — J9601 Acute respiratory failure with hypoxia: Secondary | ICD-10-CM | POA: Diagnosis not present

## 2021-12-08 DIAGNOSIS — Z9981 Dependence on supplemental oxygen: Secondary | ICD-10-CM | POA: Diagnosis not present

## 2021-12-08 DIAGNOSIS — G4700 Insomnia, unspecified: Secondary | ICD-10-CM | POA: Diagnosis not present

## 2021-12-08 DIAGNOSIS — Z5111 Encounter for antineoplastic chemotherapy: Secondary | ICD-10-CM | POA: Diagnosis not present

## 2021-12-08 DIAGNOSIS — E8770 Fluid overload, unspecified: Secondary | ICD-10-CM | POA: Diagnosis not present

## 2021-12-08 DIAGNOSIS — L308 Other specified dermatitis: Secondary | ICD-10-CM | POA: Diagnosis not present

## 2021-12-08 DIAGNOSIS — J810 Acute pulmonary edema: Secondary | ICD-10-CM | POA: Diagnosis not present

## 2021-12-09 DIAGNOSIS — D61810 Antineoplastic chemotherapy induced pancytopenia: Secondary | ICD-10-CM | POA: Diagnosis not present

## 2021-12-09 DIAGNOSIS — D709 Neutropenia, unspecified: Secondary | ICD-10-CM | POA: Diagnosis not present

## 2021-12-09 DIAGNOSIS — C9200 Acute myeloblastic leukemia, not having achieved remission: Secondary | ICD-10-CM | POA: Diagnosis not present

## 2021-12-09 DIAGNOSIS — J9601 Acute respiratory failure with hypoxia: Secondary | ICD-10-CM | POA: Diagnosis not present

## 2021-12-09 DIAGNOSIS — I4581 Long QT syndrome: Secondary | ICD-10-CM | POA: Diagnosis not present

## 2021-12-09 DIAGNOSIS — T451X5A Adverse effect of antineoplastic and immunosuppressive drugs, initial encounter: Secondary | ICD-10-CM | POA: Diagnosis not present

## 2021-12-09 DIAGNOSIS — D61818 Other pancytopenia: Secondary | ICD-10-CM | POA: Diagnosis not present

## 2021-12-09 DIAGNOSIS — K121 Other forms of stomatitis: Secondary | ICD-10-CM | POA: Diagnosis not present

## 2021-12-09 DIAGNOSIS — Z5111 Encounter for antineoplastic chemotherapy: Secondary | ICD-10-CM | POA: Diagnosis not present

## 2021-12-09 DIAGNOSIS — K1230 Oral mucositis (ulcerative), unspecified: Secondary | ICD-10-CM | POA: Diagnosis not present

## 2021-12-09 DIAGNOSIS — R197 Diarrhea, unspecified: Secondary | ICD-10-CM | POA: Diagnosis not present

## 2021-12-09 DIAGNOSIS — E8770 Fluid overload, unspecified: Secondary | ICD-10-CM | POA: Diagnosis not present

## 2021-12-10 DIAGNOSIS — J810 Acute pulmonary edema: Secondary | ICD-10-CM | POA: Diagnosis not present

## 2021-12-10 DIAGNOSIS — J90 Pleural effusion, not elsewhere classified: Secondary | ICD-10-CM | POA: Diagnosis not present

## 2021-12-10 DIAGNOSIS — C9200 Acute myeloblastic leukemia, not having achieved remission: Secondary | ICD-10-CM | POA: Diagnosis not present

## 2021-12-10 DIAGNOSIS — E8770 Fluid overload, unspecified: Secondary | ICD-10-CM | POA: Diagnosis not present

## 2021-12-10 DIAGNOSIS — I4581 Long QT syndrome: Secondary | ICD-10-CM | POA: Diagnosis not present

## 2021-12-10 DIAGNOSIS — R197 Diarrhea, unspecified: Secondary | ICD-10-CM | POA: Diagnosis not present

## 2021-12-10 DIAGNOSIS — D61818 Other pancytopenia: Secondary | ICD-10-CM | POA: Diagnosis not present

## 2021-12-10 DIAGNOSIS — D61810 Antineoplastic chemotherapy induced pancytopenia: Secondary | ICD-10-CM | POA: Diagnosis not present

## 2021-12-10 DIAGNOSIS — Z5111 Encounter for antineoplastic chemotherapy: Secondary | ICD-10-CM | POA: Diagnosis not present

## 2021-12-10 DIAGNOSIS — K1230 Oral mucositis (ulcerative), unspecified: Secondary | ICD-10-CM | POA: Diagnosis not present

## 2021-12-10 DIAGNOSIS — D709 Neutropenia, unspecified: Secondary | ICD-10-CM | POA: Diagnosis not present

## 2021-12-10 DIAGNOSIS — K121 Other forms of stomatitis: Secondary | ICD-10-CM | POA: Diagnosis not present

## 2021-12-10 DIAGNOSIS — J9601 Acute respiratory failure with hypoxia: Secondary | ICD-10-CM | POA: Diagnosis not present

## 2021-12-10 DIAGNOSIS — T451X5A Adverse effect of antineoplastic and immunosuppressive drugs, initial encounter: Secondary | ICD-10-CM | POA: Diagnosis not present

## 2021-12-11 DIAGNOSIS — K1230 Oral mucositis (ulcerative), unspecified: Secondary | ICD-10-CM | POA: Diagnosis not present

## 2021-12-11 DIAGNOSIS — E8770 Fluid overload, unspecified: Secondary | ICD-10-CM | POA: Diagnosis not present

## 2021-12-11 DIAGNOSIS — Z5111 Encounter for antineoplastic chemotherapy: Secondary | ICD-10-CM | POA: Diagnosis not present

## 2021-12-11 DIAGNOSIS — D61810 Antineoplastic chemotherapy induced pancytopenia: Secondary | ICD-10-CM | POA: Diagnosis not present

## 2021-12-11 DIAGNOSIS — L308 Other specified dermatitis: Secondary | ICD-10-CM | POA: Diagnosis not present

## 2021-12-11 DIAGNOSIS — I4581 Long QT syndrome: Secondary | ICD-10-CM | POA: Diagnosis not present

## 2021-12-11 DIAGNOSIS — Z9981 Dependence on supplemental oxygen: Secondary | ICD-10-CM | POA: Diagnosis not present

## 2021-12-11 DIAGNOSIS — D6949 Other primary thrombocytopenia: Secondary | ICD-10-CM | POA: Diagnosis not present

## 2021-12-11 DIAGNOSIS — R197 Diarrhea, unspecified: Secondary | ICD-10-CM | POA: Diagnosis not present

## 2021-12-11 DIAGNOSIS — J9601 Acute respiratory failure with hypoxia: Secondary | ICD-10-CM | POA: Diagnosis not present

## 2021-12-11 DIAGNOSIS — G4700 Insomnia, unspecified: Secondary | ICD-10-CM | POA: Diagnosis not present

## 2021-12-11 DIAGNOSIS — C9200 Acute myeloblastic leukemia, not having achieved remission: Secondary | ICD-10-CM | POA: Diagnosis not present

## 2021-12-12 DIAGNOSIS — D61810 Antineoplastic chemotherapy induced pancytopenia: Secondary | ICD-10-CM | POA: Diagnosis not present

## 2021-12-12 DIAGNOSIS — K1230 Oral mucositis (ulcerative), unspecified: Secondary | ICD-10-CM | POA: Diagnosis not present

## 2021-12-12 DIAGNOSIS — Z5111 Encounter for antineoplastic chemotherapy: Secondary | ICD-10-CM | POA: Diagnosis not present

## 2021-12-12 DIAGNOSIS — D708 Other neutropenia: Secondary | ICD-10-CM | POA: Diagnosis not present

## 2021-12-12 DIAGNOSIS — L308 Other specified dermatitis: Secondary | ICD-10-CM | POA: Diagnosis not present

## 2021-12-12 DIAGNOSIS — I4581 Long QT syndrome: Secondary | ICD-10-CM | POA: Diagnosis not present

## 2021-12-12 DIAGNOSIS — R197 Diarrhea, unspecified: Secondary | ICD-10-CM | POA: Diagnosis not present

## 2021-12-12 DIAGNOSIS — J9601 Acute respiratory failure with hypoxia: Secondary | ICD-10-CM | POA: Diagnosis not present

## 2021-12-12 DIAGNOSIS — G4700 Insomnia, unspecified: Secondary | ICD-10-CM | POA: Diagnosis not present

## 2021-12-12 DIAGNOSIS — E8770 Fluid overload, unspecified: Secondary | ICD-10-CM | POA: Diagnosis not present

## 2021-12-12 DIAGNOSIS — K719 Toxic liver disease, unspecified: Secondary | ICD-10-CM | POA: Diagnosis not present

## 2021-12-12 DIAGNOSIS — C9200 Acute myeloblastic leukemia, not having achieved remission: Secondary | ICD-10-CM | POA: Diagnosis not present

## 2021-12-13 DIAGNOSIS — C9200 Acute myeloblastic leukemia, not having achieved remission: Secondary | ICD-10-CM | POA: Diagnosis not present

## 2021-12-13 DIAGNOSIS — D61810 Antineoplastic chemotherapy induced pancytopenia: Secondary | ICD-10-CM | POA: Diagnosis not present

## 2021-12-13 DIAGNOSIS — R4182 Altered mental status, unspecified: Secondary | ICD-10-CM | POA: Diagnosis not present

## 2021-12-13 DIAGNOSIS — E8770 Fluid overload, unspecified: Secondary | ICD-10-CM | POA: Diagnosis not present

## 2021-12-13 DIAGNOSIS — R197 Diarrhea, unspecified: Secondary | ICD-10-CM | POA: Diagnosis not present

## 2021-12-13 DIAGNOSIS — G4700 Insomnia, unspecified: Secondary | ICD-10-CM | POA: Diagnosis not present

## 2021-12-13 DIAGNOSIS — R5081 Fever presenting with conditions classified elsewhere: Secondary | ICD-10-CM | POA: Diagnosis not present

## 2021-12-13 DIAGNOSIS — D709 Neutropenia, unspecified: Secondary | ICD-10-CM | POA: Diagnosis not present

## 2021-12-13 DIAGNOSIS — R21 Rash and other nonspecific skin eruption: Secondary | ICD-10-CM | POA: Diagnosis not present

## 2021-12-13 DIAGNOSIS — Z9981 Dependence on supplemental oxygen: Secondary | ICD-10-CM | POA: Diagnosis not present

## 2021-12-13 DIAGNOSIS — J9601 Acute respiratory failure with hypoxia: Secondary | ICD-10-CM | POA: Diagnosis not present

## 2021-12-13 DIAGNOSIS — D708 Other neutropenia: Secondary | ICD-10-CM | POA: Diagnosis not present

## 2021-12-13 DIAGNOSIS — Z5111 Encounter for antineoplastic chemotherapy: Secondary | ICD-10-CM | POA: Diagnosis not present

## 2021-12-13 DIAGNOSIS — L308 Other specified dermatitis: Secondary | ICD-10-CM | POA: Diagnosis not present

## 2021-12-13 DIAGNOSIS — K1230 Oral mucositis (ulcerative), unspecified: Secondary | ICD-10-CM | POA: Diagnosis not present

## 2021-12-13 DIAGNOSIS — C9290 Myeloid leukemia, unspecified, not having achieved remission: Secondary | ICD-10-CM | POA: Diagnosis not present

## 2021-12-14 DIAGNOSIS — Z5111 Encounter for antineoplastic chemotherapy: Secondary | ICD-10-CM | POA: Diagnosis not present

## 2021-12-14 DIAGNOSIS — R945 Abnormal results of liver function studies: Secondary | ICD-10-CM | POA: Diagnosis not present

## 2021-12-14 DIAGNOSIS — R410 Disorientation, unspecified: Secondary | ICD-10-CM | POA: Diagnosis not present

## 2021-12-14 DIAGNOSIS — E8770 Fluid overload, unspecified: Secondary | ICD-10-CM | POA: Diagnosis not present

## 2021-12-14 DIAGNOSIS — D708 Other neutropenia: Secondary | ICD-10-CM | POA: Diagnosis not present

## 2021-12-14 DIAGNOSIS — J9601 Acute respiratory failure with hypoxia: Secondary | ICD-10-CM | POA: Diagnosis not present

## 2021-12-14 DIAGNOSIS — K719 Toxic liver disease, unspecified: Secondary | ICD-10-CM | POA: Diagnosis not present

## 2021-12-14 DIAGNOSIS — R110 Nausea: Secondary | ICD-10-CM | POA: Diagnosis not present

## 2021-12-14 DIAGNOSIS — G9340 Encephalopathy, unspecified: Secondary | ICD-10-CM | POA: Diagnosis not present

## 2021-12-14 DIAGNOSIS — C9200 Acute myeloblastic leukemia, not having achieved remission: Secondary | ICD-10-CM | POA: Diagnosis not present

## 2021-12-14 DIAGNOSIS — F329 Major depressive disorder, single episode, unspecified: Secondary | ICD-10-CM | POA: Diagnosis not present

## 2021-12-14 DIAGNOSIS — C9590 Leukemia, unspecified not having achieved remission: Secondary | ICD-10-CM | POA: Diagnosis not present

## 2021-12-14 DIAGNOSIS — K219 Gastro-esophageal reflux disease without esophagitis: Secondary | ICD-10-CM | POA: Diagnosis not present

## 2021-12-14 DIAGNOSIS — D61810 Antineoplastic chemotherapy induced pancytopenia: Secondary | ICD-10-CM | POA: Diagnosis not present

## 2021-12-15 DIAGNOSIS — E8770 Fluid overload, unspecified: Secondary | ICD-10-CM | POA: Diagnosis not present

## 2021-12-15 DIAGNOSIS — C9200 Acute myeloblastic leukemia, not having achieved remission: Secondary | ICD-10-CM | POA: Diagnosis not present

## 2021-12-15 DIAGNOSIS — G9340 Encephalopathy, unspecified: Secondary | ICD-10-CM | POA: Diagnosis not present

## 2021-12-15 DIAGNOSIS — Z960 Presence of urogenital implants: Secondary | ICD-10-CM | POA: Diagnosis not present

## 2021-12-15 DIAGNOSIS — F329 Major depressive disorder, single episode, unspecified: Secondary | ICD-10-CM | POA: Diagnosis not present

## 2021-12-15 DIAGNOSIS — R5383 Other fatigue: Secondary | ICD-10-CM | POA: Diagnosis not present

## 2021-12-15 DIAGNOSIS — J90 Pleural effusion, not elsewhere classified: Secondary | ICD-10-CM | POA: Diagnosis not present

## 2021-12-15 DIAGNOSIS — D708 Other neutropenia: Secondary | ICD-10-CM | POA: Diagnosis not present

## 2021-12-15 DIAGNOSIS — K719 Toxic liver disease, unspecified: Secondary | ICD-10-CM | POA: Diagnosis not present

## 2021-12-15 DIAGNOSIS — K219 Gastro-esophageal reflux disease without esophagitis: Secondary | ICD-10-CM | POA: Diagnosis not present

## 2021-12-15 DIAGNOSIS — D61810 Antineoplastic chemotherapy induced pancytopenia: Secondary | ICD-10-CM | POA: Diagnosis not present

## 2021-12-15 DIAGNOSIS — J9601 Acute respiratory failure with hypoxia: Secondary | ICD-10-CM | POA: Diagnosis not present

## 2021-12-16 DIAGNOSIS — J9601 Acute respiratory failure with hypoxia: Secondary | ICD-10-CM | POA: Diagnosis not present

## 2021-12-16 DIAGNOSIS — F329 Major depressive disorder, single episode, unspecified: Secondary | ICD-10-CM | POA: Diagnosis not present

## 2021-12-16 DIAGNOSIS — R5081 Fever presenting with conditions classified elsewhere: Secondary | ICD-10-CM | POA: Diagnosis not present

## 2021-12-16 DIAGNOSIS — E8770 Fluid overload, unspecified: Secondary | ICD-10-CM | POA: Diagnosis not present

## 2021-12-16 DIAGNOSIS — D61810 Antineoplastic chemotherapy induced pancytopenia: Secondary | ICD-10-CM | POA: Diagnosis not present

## 2021-12-16 DIAGNOSIS — Z960 Presence of urogenital implants: Secondary | ICD-10-CM | POA: Diagnosis not present

## 2021-12-16 DIAGNOSIS — K719 Toxic liver disease, unspecified: Secondary | ICD-10-CM | POA: Diagnosis not present

## 2021-12-16 DIAGNOSIS — D708 Other neutropenia: Secondary | ICD-10-CM | POA: Diagnosis not present

## 2021-12-16 DIAGNOSIS — C9200 Acute myeloblastic leukemia, not having achieved remission: Secondary | ICD-10-CM | POA: Diagnosis not present

## 2021-12-16 DIAGNOSIS — K219 Gastro-esophageal reflux disease without esophagitis: Secondary | ICD-10-CM | POA: Diagnosis not present

## 2021-12-16 DIAGNOSIS — Z5111 Encounter for antineoplastic chemotherapy: Secondary | ICD-10-CM | POA: Diagnosis not present

## 2021-12-16 DIAGNOSIS — G9340 Encephalopathy, unspecified: Secondary | ICD-10-CM | POA: Diagnosis not present

## 2021-12-19 ENCOUNTER — Other Ambulatory Visit: Payer: Medicare Other

## 2021-12-21 DIAGNOSIS — E8770 Fluid overload, unspecified: Secondary | ICD-10-CM | POA: Diagnosis not present

## 2021-12-21 DIAGNOSIS — A4901 Methicillin susceptible Staphylococcus aureus infection, unspecified site: Secondary | ICD-10-CM | POA: Diagnosis not present

## 2021-12-21 DIAGNOSIS — D708 Other neutropenia: Secondary | ICD-10-CM | POA: Diagnosis not present

## 2021-12-21 DIAGNOSIS — R5081 Fever presenting with conditions classified elsewhere: Secondary | ICD-10-CM | POA: Diagnosis not present

## 2021-12-21 DIAGNOSIS — K719 Toxic liver disease, unspecified: Secondary | ICD-10-CM | POA: Diagnosis not present

## 2021-12-21 DIAGNOSIS — F329 Major depressive disorder, single episode, unspecified: Secondary | ICD-10-CM | POA: Diagnosis not present

## 2021-12-21 DIAGNOSIS — Z85028 Personal history of other malignant neoplasm of stomach: Secondary | ICD-10-CM | POA: Diagnosis not present

## 2021-12-21 DIAGNOSIS — C9200 Acute myeloblastic leukemia, not having achieved remission: Secondary | ICD-10-CM | POA: Diagnosis not present

## 2021-12-21 DIAGNOSIS — J9601 Acute respiratory failure with hypoxia: Secondary | ICD-10-CM | POA: Diagnosis not present

## 2021-12-21 DIAGNOSIS — Z5111 Encounter for antineoplastic chemotherapy: Secondary | ICD-10-CM | POA: Diagnosis not present

## 2021-12-21 DIAGNOSIS — D61810 Antineoplastic chemotherapy induced pancytopenia: Secondary | ICD-10-CM | POA: Diagnosis not present

## 2021-12-21 DIAGNOSIS — K219 Gastro-esophageal reflux disease without esophagitis: Secondary | ICD-10-CM | POA: Diagnosis not present

## 2021-12-22 DIAGNOSIS — I351 Nonrheumatic aortic (valve) insufficiency: Secondary | ICD-10-CM | POA: Diagnosis not present

## 2021-12-22 DIAGNOSIS — A491 Streptococcal infection, unspecified site: Secondary | ICD-10-CM | POA: Diagnosis not present

## 2021-12-22 DIAGNOSIS — E8770 Fluid overload, unspecified: Secondary | ICD-10-CM | POA: Diagnosis not present

## 2021-12-22 DIAGNOSIS — I5020 Unspecified systolic (congestive) heart failure: Secondary | ICD-10-CM | POA: Diagnosis not present

## 2021-12-22 DIAGNOSIS — C9200 Acute myeloblastic leukemia, not having achieved remission: Secondary | ICD-10-CM | POA: Diagnosis not present

## 2021-12-22 DIAGNOSIS — G9340 Encephalopathy, unspecified: Secondary | ICD-10-CM | POA: Diagnosis not present

## 2021-12-22 DIAGNOSIS — K719 Toxic liver disease, unspecified: Secondary | ICD-10-CM | POA: Diagnosis not present

## 2021-12-22 DIAGNOSIS — Z903 Acquired absence of stomach [part of]: Secondary | ICD-10-CM | POA: Diagnosis not present

## 2021-12-22 DIAGNOSIS — R570 Cardiogenic shock: Secondary | ICD-10-CM | POA: Diagnosis not present

## 2021-12-22 DIAGNOSIS — R4182 Altered mental status, unspecified: Secondary | ICD-10-CM | POA: Diagnosis not present

## 2021-12-22 DIAGNOSIS — J9601 Acute respiratory failure with hypoxia: Secondary | ICD-10-CM | POA: Diagnosis not present

## 2021-12-22 DIAGNOSIS — C169 Malignant neoplasm of stomach, unspecified: Secondary | ICD-10-CM | POA: Diagnosis not present

## 2021-12-22 DIAGNOSIS — D61810 Antineoplastic chemotherapy induced pancytopenia: Secondary | ICD-10-CM | POA: Diagnosis not present

## 2021-12-22 DIAGNOSIS — Z5111 Encounter for antineoplastic chemotherapy: Secondary | ICD-10-CM | POA: Diagnosis not present

## 2021-12-22 DIAGNOSIS — K219 Gastro-esophageal reflux disease without esophagitis: Secondary | ICD-10-CM | POA: Diagnosis not present

## 2021-12-22 DIAGNOSIS — F329 Major depressive disorder, single episode, unspecified: Secondary | ICD-10-CM | POA: Diagnosis not present

## 2021-12-22 DIAGNOSIS — R7881 Bacteremia: Secondary | ICD-10-CM | POA: Diagnosis not present

## 2021-12-23 DIAGNOSIS — E8770 Fluid overload, unspecified: Secondary | ICD-10-CM | POA: Diagnosis not present

## 2021-12-23 DIAGNOSIS — Z5111 Encounter for antineoplastic chemotherapy: Secondary | ICD-10-CM | POA: Diagnosis not present

## 2021-12-23 DIAGNOSIS — A491 Streptococcal infection, unspecified site: Secondary | ICD-10-CM | POA: Diagnosis not present

## 2021-12-23 DIAGNOSIS — C9200 Acute myeloblastic leukemia, not having achieved remission: Secondary | ICD-10-CM | POA: Diagnosis not present

## 2021-12-23 DIAGNOSIS — Z903 Acquired absence of stomach [part of]: Secondary | ICD-10-CM | POA: Diagnosis not present

## 2021-12-23 DIAGNOSIS — C169 Malignant neoplasm of stomach, unspecified: Secondary | ICD-10-CM | POA: Diagnosis not present

## 2021-12-23 DIAGNOSIS — J9601 Acute respiratory failure with hypoxia: Secondary | ICD-10-CM | POA: Diagnosis not present

## 2021-12-23 DIAGNOSIS — K219 Gastro-esophageal reflux disease without esophagitis: Secondary | ICD-10-CM | POA: Diagnosis not present

## 2021-12-23 DIAGNOSIS — R7881 Bacteremia: Secondary | ICD-10-CM | POA: Diagnosis not present

## 2021-12-23 DIAGNOSIS — R000 Tachycardia, unspecified: Secondary | ICD-10-CM | POA: Diagnosis not present

## 2021-12-23 DIAGNOSIS — D61810 Antineoplastic chemotherapy induced pancytopenia: Secondary | ICD-10-CM | POA: Diagnosis not present

## 2021-12-23 DIAGNOSIS — K719 Toxic liver disease, unspecified: Secondary | ICD-10-CM | POA: Diagnosis not present

## 2021-12-23 DIAGNOSIS — R0602 Shortness of breath: Secondary | ICD-10-CM | POA: Diagnosis not present

## 2021-12-23 DIAGNOSIS — F329 Major depressive disorder, single episode, unspecified: Secondary | ICD-10-CM | POA: Diagnosis not present

## 2021-12-24 DIAGNOSIS — E8770 Fluid overload, unspecified: Secondary | ICD-10-CM | POA: Diagnosis not present

## 2021-12-24 DIAGNOSIS — A491 Streptococcal infection, unspecified site: Secondary | ICD-10-CM | POA: Diagnosis not present

## 2021-12-24 DIAGNOSIS — R7881 Bacteremia: Secondary | ICD-10-CM | POA: Diagnosis not present

## 2021-12-24 DIAGNOSIS — K719 Toxic liver disease, unspecified: Secondary | ICD-10-CM | POA: Diagnosis not present

## 2021-12-24 DIAGNOSIS — J9601 Acute respiratory failure with hypoxia: Secondary | ICD-10-CM | POA: Diagnosis not present

## 2021-12-24 DIAGNOSIS — F329 Major depressive disorder, single episode, unspecified: Secondary | ICD-10-CM | POA: Diagnosis not present

## 2021-12-24 DIAGNOSIS — Z5111 Encounter for antineoplastic chemotherapy: Secondary | ICD-10-CM | POA: Diagnosis not present

## 2021-12-24 DIAGNOSIS — D61810 Antineoplastic chemotherapy induced pancytopenia: Secondary | ICD-10-CM | POA: Diagnosis not present

## 2021-12-24 DIAGNOSIS — C169 Malignant neoplasm of stomach, unspecified: Secondary | ICD-10-CM | POA: Diagnosis not present

## 2021-12-24 DIAGNOSIS — K219 Gastro-esophageal reflux disease without esophagitis: Secondary | ICD-10-CM | POA: Diagnosis not present

## 2021-12-24 DIAGNOSIS — Z903 Acquired absence of stomach [part of]: Secondary | ICD-10-CM | POA: Diagnosis not present

## 2021-12-24 DIAGNOSIS — C9200 Acute myeloblastic leukemia, not having achieved remission: Secondary | ICD-10-CM | POA: Diagnosis not present

## 2022-01-17 DEATH — deceased

## 2022-03-15 ENCOUNTER — Other Ambulatory Visit: Payer: Medicare Other

## 2022-05-09 ENCOUNTER — Inpatient Hospital Stay: Payer: Self-pay | Admitting: Oncology
